# Patient Record
Sex: Female | Born: 1975 | Race: Black or African American | Hispanic: No | Marital: Single | Smoking: Current every day smoker
Health system: Southern US, Community
[De-identification: ages and names within clinical notes are randomized; demographics above are authoritative.]

## PROBLEM LIST (undated history)

## (undated) DIAGNOSIS — I10 Essential (primary) hypertension: Secondary | ICD-10-CM

## (undated) DIAGNOSIS — N739 Female pelvic inflammatory disease, unspecified: Secondary | ICD-10-CM

## (undated) DIAGNOSIS — R0602 Shortness of breath: Secondary | ICD-10-CM

## (undated) DIAGNOSIS — M549 Dorsalgia, unspecified: Secondary | ICD-10-CM

## (undated) DIAGNOSIS — Z789 Other specified health status: Secondary | ICD-10-CM

## (undated) DIAGNOSIS — K219 Gastro-esophageal reflux disease without esophagitis: Secondary | ICD-10-CM

## (undated) DIAGNOSIS — J45909 Unspecified asthma, uncomplicated: Secondary | ICD-10-CM

## (undated) DIAGNOSIS — M1990 Unspecified osteoarthritis, unspecified site: Secondary | ICD-10-CM

## (undated) DIAGNOSIS — L309 Dermatitis, unspecified: Secondary | ICD-10-CM

## (undated) DIAGNOSIS — R12 Heartburn: Secondary | ICD-10-CM

## (undated) DIAGNOSIS — N7093 Salpingitis and oophoritis, unspecified: Secondary | ICD-10-CM

## (undated) HISTORY — PX: TOTAL HIP ARTHROPLASTY: SHX124

## (undated) HISTORY — DX: Female pelvic inflammatory disease, unspecified: N73.9

## (undated) HISTORY — DX: Gastro-esophageal reflux disease without esophagitis: K21.9

## (undated) HISTORY — DX: Salpingitis and oophoritis, unspecified: N70.93

## (undated) HISTORY — DX: Dorsalgia, unspecified: M54.9

## (undated) HISTORY — DX: Heartburn: R12

## (undated) HISTORY — DX: Dermatitis, unspecified: L30.9

## (undated) HISTORY — DX: Essential (primary) hypertension: I10

## (undated) HISTORY — DX: Unspecified osteoarthritis, unspecified site: M19.90

## (undated) HISTORY — DX: Shortness of breath: R06.02

---

## 2012-10-01 ENCOUNTER — Emergency Department (HOSPITAL_COMMUNITY)
Admission: EM | Admit: 2012-10-01 | Discharge: 2012-10-01 | Disposition: A | Discharge status: Home / Self Care | Payer: Self-pay | Attending: Emergency Medicine | Admitting: Emergency Medicine

## 2012-10-01 ENCOUNTER — Encounter (HOSPITAL_COMMUNITY): Payer: Self-pay

## 2012-10-01 DIAGNOSIS — H6120 Impacted cerumen, unspecified ear: Secondary | ICD-10-CM | POA: Insufficient documentation

## 2012-10-01 DIAGNOSIS — F17200 Nicotine dependence, unspecified, uncomplicated: Secondary | ICD-10-CM | POA: Insufficient documentation

## 2012-10-01 DIAGNOSIS — H9190 Unspecified hearing loss, unspecified ear: Secondary | ICD-10-CM | POA: Insufficient documentation

## 2012-10-01 DIAGNOSIS — H6690 Otitis media, unspecified, unspecified ear: Secondary | ICD-10-CM | POA: Insufficient documentation

## 2012-10-01 MED ORDER — AMOXICILLIN 500 MG PO CAPS: 500.0000 mg | ORAL_CAPSULE | Freq: Three times a day (TID) | ORAL | Status: DC

## 2012-10-01 MED ORDER — DOCUSATE SODIUM 50 MG/5ML PO LIQD
10.0000 mg | Freq: Once | ORAL | Status: AC
  Administered 2012-10-01: 10 mg via ORAL
  Filled 2012-10-01: qty 10

## 2012-10-01 MED ORDER — ANTIPYRINE-BENZOCAINE 5.4-1.4 % OT SOLN: 3.0000 [drp] | OTIC | Status: DC | PRN

## 2012-10-01 NOTE — ED Provider Notes (Signed)
History     CSN: [id number]  Arrival date & time 10/01/12  2956   First MD Initiated Contact with Patient 10/01/12 0745      Chief Complaint  Patient presents with  . Otalgia    (Consider location/radiation/quality/duration/timing/severity/associated sxs/prior treatment) Patient is a 37 y.o. female presenting with ear pain.  Otalgia Location:  Bilateral Behind ear:  No abnormality Quality:  Aching and pressure Severity:  Mild Onset quality:  Gradual Duration:  4 days Timing:  Constant Progression:  Worsening Chronicity:  New Relieved by:  Nothing Ineffective treatments: ear wax removal. Associated symptoms: hearing loss   Associated symptoms: no abdominal pain, no congestion, no cough, no diarrhea, no ear discharge, no fever, no headaches, no rash, no rhinorrhea, no sore throat and no vomiting     Bre Ulloa is a 37 y.o. female  with no past medical history presents to the Emergency Department complaining of gradual, persistent, progressively worsening otalgia of the bilateral ears onset 4 days ago. Patient states she brought over-the-counter ear wax removal and attempted to remove the ear wax is at increased her pain. Patient also states she has associated decrease in hearing bilaterally.  Nothing makes it better and nothing makes it worse.  Pt denies fever, chills, headache, and [place], nasal congestion, sore throat, cough, congestion, nausea, vomiting, diarrhea, chest pain, shortness of breath.     History reviewed. No pertinent past medical history.  Past Surgical History  Procedure Laterality Date  . Cesarean section      No family history on file.  History  Substance Use Topics  . Smoking status: Current Every Day Smoker  . Smokeless tobacco: Not on file  . Alcohol Use: Yes    OB History   Grav Para Term Preterm Abortions TAB SAB Ect Mult Living                  Review of Systems  Constitutional: Negative for fever, chills, appetite change and  fatigue.  HENT: Positive for hearing loss and ear pain. Negative for congestion, sore throat, rhinorrhea, mouth sores, neck stiffness, postnasal drip, sinus pressure and ear discharge.   Eyes: Negative for visual disturbance.  Respiratory: Negative for cough, chest tightness, shortness of breath, wheezing and stridor.   Cardiovascular: Negative for chest pain, palpitations and leg swelling.  Gastrointestinal: Negative for nausea, vomiting, abdominal pain and diarrhea.  Genitourinary: Negative for dysuria, urgency, frequency and hematuria.  Musculoskeletal: Negative for myalgias, back pain and arthralgias.  Skin: Negative for rash.  Neurological: Negative for syncope, light-headedness, numbness and headaches.  Hematological: Negative for adenopathy.  Psychiatric/Behavioral: The patient is not nervous/anxious.   All other systems reviewed and are negative.    Allergies  Strawberry  Home Medications   Current Outpatient Rx  Name  Route  Sig  Dispense  Refill  . amoxicillin (AMOXIL) 500 MG capsule   Oral   Take 1 capsule (500 mg total) by mouth 3 (three) times daily.   21 capsule   0   . antipyrine-benzocaine (AURALGAN) otic solution   Right Ear   Place 3 drops into the right ear every 2 (two) hours as needed for pain.   10 mL   0     BP 114/87  Pulse 86  Temp(Src) 97.8 F (36.6 C) (Oral)  Resp 15  SpO2 100%  LMP 09/23/2012  Physical Exam  Constitutional: She is oriented to person, place, and time. She appears well-developed and well-nourished. No distress.  HENT:  Head: Normocephalic  and atraumatic.  Right Ear: External ear normal. Decreased hearing is noted.  Left Ear: External ear normal. Decreased hearing is noted.  Nose: No mucosal edema or rhinorrhea. No epistaxis. Right sinus exhibits no maxillary sinus tenderness and no frontal sinus tenderness. Left sinus exhibits no maxillary sinus tenderness and no frontal sinus tenderness.  Mouth/Throat: Uvula is midline,  oropharynx is clear and moist and mucous membranes are normal. Mucous membranes are not pale and not cyanotic. No edematous. No oropharyngeal exudate, posterior oropharyngeal edema, posterior oropharyngeal erythema or tonsillar abscesses.  Cerumen impaction in canals bilaterally  Eyes: Conjunctivae are normal. Pupils are equal, round, and reactive to light.  Neck: Normal range of motion and full passive range of motion without pain.  Cardiovascular: Normal rate, normal heart sounds and intact distal pulses.  Exam reveals no gallop and no friction rub.   No murmur heard. Pulmonary/Chest: Effort normal and breath sounds normal. No stridor.  Abdominal: Soft. Bowel sounds are normal.  Musculoskeletal: Normal range of motion.  Lymphadenopathy:    She has no cervical adenopathy.  Neurological: She is alert and oriented to person, place, and time. She exhibits normal muscle tone. Coordination normal.  Skin: Skin is dry. No rash noted. She is not diaphoretic.  Psychiatric: She has a normal mood and affect.    ED Course  Procedures (including critical care time)  Labs Reviewed - No data to display No results found.   1. Otitis media, bilateral       MDM  Sharene Skeans presents with otalgia and significant cerumen impaction. After several attempts at cerumen impaction removed completely from right ear and partially from left ear. Visualization of TMs shows erythema, bulging, blunted light reflex and purulent fluid behind the TM. Patient presents with otalgia and exam consistent with acute otitis media. No concern for acute mastoiditis, meningitis.  Hearing improved with cerumen removal. No antibiotic use in the last month.  Patient discharged home with Amoxicillin.  Advised patient to follow-up in 3-5 days for reevaluation.  I have also discussed reasons to return immediately to the ER.  Patient expresses understanding and agrees with plan.          Dierdre Forth, PA-C 10/01/12  [address], PA-C 10/01/12 1148

## 2012-10-01 NOTE — ED Notes (Signed)
Pt states she's had intermittent bilateral ear pain associated with "popping" for 4 days.  Pt states she used an ear wax removal kit from [facility] yesterday.  Pt states that it now feels like her ears are closed off.

## 2012-10-03 NOTE — ED Provider Notes (Signed)
Medical screening examination/treatment/procedure(s) were performed by non-physician practitioner and as supervising physician I was immediately available for consultation/collaboration.  Toy Baker, MD 10/03/12 [phone]

## 2013-01-06 ENCOUNTER — Inpatient Hospital Stay (HOSPITAL_COMMUNITY)
Admission: AD | Admit: 2013-01-06 | Discharge: 2013-01-07 | Disposition: A | Discharge status: Home / Self Care | Payer: Self-pay | Source: Ambulatory Visit | Attending: Obstetrics & Gynecology | Admitting: Obstetrics & Gynecology

## 2013-01-06 ENCOUNTER — Inpatient Hospital Stay (HOSPITAL_COMMUNITY): Payer: Self-pay

## 2013-01-06 ENCOUNTER — Encounter (HOSPITAL_COMMUNITY): Payer: Self-pay | Admitting: *Deleted

## 2013-01-06 DIAGNOSIS — O034 Incomplete spontaneous abortion without complication: Secondary | ICD-10-CM | POA: Insufficient documentation

## 2013-01-06 HISTORY — DX: Other specified health status: Z78.9

## 2013-01-06 LAB — URINALYSIS, ROUTINE W REFLEX MICROSCOPIC
Glucose, UA: NEGATIVE mg/dL
Specific Gravity, Urine: >1.03 — ABNORMAL HIGH (ref 1.005–1.030)

## 2013-01-06 LAB — POCT PREGNANCY, URINE: Preg Test, Ur: POSITIVE — AB

## 2013-01-06 LAB — URINE MICROSCOPIC-ADD ON

## 2013-01-06 LAB — CBC
HCT: 36.8 % (ref 36.0–46.0)
MCV: 88.9 fL (ref 78.0–100.0)
RBC: 4.14 MIL/uL (ref 3.87–5.11)
WBC: 9.1 10*3/uL (ref 4.0–10.5)

## 2013-01-06 LAB — WET PREP, GENITAL: Trich, Wet Prep: NONE SEEN

## 2013-01-06 NOTE — MAU Note (Signed)
[facility], spotting yesterday and today, vaginal bleeding x 1 hour. No pain

## 2013-01-06 NOTE — MAU Provider Note (Signed)
Chief Complaint: Vaginal Bleeding   First Provider Initiated Contact with Patient 01/06/13 2203     SUBJECTIVE HPI: Erica [name] is a 37 y.o. G4P3 at [redacted]w[redacted]d by LMP who presents to maternity admissions reporting bright red vaginal bleeding starting 2-3 hours prior to arrival in MAU.  She reports that brown vaginal spotting started 2 days ago, then tonight she had bright red bleeding when she wiped, but not enough to soak a pad.  She denies abdominal pain, vaginal itching/burning, urinary symptoms, h/a, dizziness, n/v, or fever/chills.     Past Medical History  Diagnosis Date  . Medical history non-contributory    Past Surgical History  Procedure Laterality Date  . Cesarean section     History   Social History  . Marital Status: Single    Spouse Name: N/A    Number of Children: N/A  . Years of Education: N/A   Occupational History  . Not on file.   Social History Main Topics  . Smoking status: Current Every Day Smoker  . Smokeless tobacco: Not on file  . Alcohol Use: No  . Drug Use: No  . Sexually Active: Yes    Birth Control/ Protection: None   Other Topics Concern  . Not on file   Social History Narrative  . No narrative on file   No current facility-administered medications on file prior to encounter.   No current outpatient prescriptions on file prior to encounter.   Allergies  Allergen Reactions  . Strawberry Swelling    ROS: Pertinent items in HPI  OBJECTIVE Blood pressure 130/81, pulse 82, temperature 99.1 F (37.3 C), temperature source Oral, resp. rate 20, height 5\' 1"  (1.549 m), weight 85.73 kg (189 lb), last menstrual period 11/27/2012, SpO2 98.00%. GENERAL: Well-developed, well-nourished female in no acute distress.  HEENT: Normocephalic HEART: normal rate RESP: normal effort ABDOMEN: Soft, non-tender EXTREMITIES: Nontender, no edema NEURO: Alert and oriented Pelvic exam: Cervix pink, visually closed, without lesion, small amount dark red blood  in vaginal vault and from cervical os, vaginal walls and external genitalia normal Bimanual exam: Cervix 0/long/high, firm, anterior, neg CMT, uterus nontender, nonenlarged, adnexa without tenderness, enlargement, or mass  LAB RESULTS Results for orders placed during the hospital encounter of 01/06/13 (from the past 24 hour(s))  URINALYSIS, ROUTINE W REFLEX MICROSCOPIC     Status: Abnormal   Collection Time    01/06/13  9:03 PM      Result Value Range   Color, Urine YELLOW  YELLOW   APPearance CLEAR  CLEAR   Specific Gravity, Urine >1.030 (*) 1.005 - 1.030   pH 6.0  5.0 - 8.0   Glucose, UA NEGATIVE  NEGATIVE mg/dL   Hgb urine dipstick LARGE (*) NEGATIVE   Bilirubin Urine NEGATIVE  NEGATIVE   Ketones, ur NEGATIVE  NEGATIVE mg/dL   Protein, ur NEGATIVE  NEGATIVE mg/dL   Urobilinogen, UA 0.2  0.0 - 1.0 mg/dL   Nitrite NEGATIVE  NEGATIVE   Leukocytes, UA SMALL (*) NEGATIVE  URINE MICROSCOPIC-ADD ON     Status: Abnormal   Collection Time    01/06/13  9:03 PM      Result Value Range   Squamous Epithelial / LPF FEW (*) RARE   WBC, UA 7-10  <3 WBC/hpf   RBC / HPF 11-20  <3 RBC/hpf   Bacteria, UA FEW (*) RARE  POCT PREGNANCY, URINE     Status: Abnormal   Collection Time    01/06/13  9:11 PM  Result Value Range   Preg Test, Ur POSITIVE (*) NEGATIVE  CBC     Status: None   Collection Time    01/06/13  9:50 PM      Result Value Range   WBC 9.1  4.0 - 10.5 K/uL   RBC 4.14  3.87 - 5.11 MIL/uL   Hemoglobin 12.3  12.0 - 15.0 g/dL   HCT 40.9  81.1 - 91.4 %   MCV 88.9  78.0 - 100.0 fL   MCH 29.7  26.0 - 34.0 pg   MCHC 33.4  30.0 - 36.0 g/dL   RDW 78.2  95.6 - 21.3 %   Platelets 258  150 - 400 K/uL  ABO/RH     Status: None   Collection Time    01/06/13  9:50 PM      Result Value Range   ABO/RH(D) O POS    HCG, QUANTITATIVE, PREGNANCY     Status: Abnormal   Collection Time    01/06/13  9:50 PM      Result Value Range   hCG, Beta Chain, Quant, S 17875 (*) <5 mIU/mL  WET PREP,  GENITAL     Status: Abnormal   Collection Time    01/06/13 10:00 PM      Result Value Range   Yeast Wet Prep HPF POC NONE SEEN  NONE SEEN   Trich, Wet Prep NONE SEEN  NONE SEEN   Clue Cells Wet Prep HPF POC FEW (*) NONE SEEN   WBC, Wet Prep HPF POC NONE SEEN  NONE SEEN    IMAGING US Ob Comp Less 14 Wks  01/06/2013   *RADIOLOGY REPORT*  Clinical Data: Bleeding, pregnancy, quantitative beta HCG 17,175; gestational age [redacted] weeks 5 days EGA by LMP  OBSTETRIC <14 WK [place] AND TRANSVAGINAL OB US  Technique:  Both transabdominal and transvaginal ultrasound examinations were performed for complete evaluation of the gestation as well as the maternal uterus, adnexal regions, and pelvic cul-de-sac.  Transvaginal technique was performed to assess early pregnancy.  Comparison:  None  Intrauterine gestational sac:  Visualized/normal in shape. Yolk sac: Absent Embryo: Absent Cardiac Activity: N/A Heart Rate: N/A bpm  MSD: 15.1 mm        6 w 3 d         [place] EDC:  Maternal uterus/adnexae: No subchorionic hemorrhage. Left ovary normal size and morphology 3.4 x 2.3 x 1.4 cm. Right ovary measures 4.6 x 3.2 by 4.7 cm and contains a 2.8 x 2.7 x 2.6 cm corpus luteal cyst. No other adnexal masses or free pelvic fluid.  IMPRESSION: Small gestational sac identified within uterus without visualization of a yolk sac or fetal pole. The absence of a yolk sac and fetal pole at this sac diameter and this quantitative beta HCG level is suspicious for a nonviable pregnancy. Follow-up ultrasound recommended in 10-14 days to reassess for viability.   Original Report Authenticated By: Ulyses Southward, M.D.   US Ob Transvaginal  01/06/2013   *RADIOLOGY REPORT*  Clinical Data: Bleeding, pregnancy, quantitative beta HCG 17,175; gestational age [redacted] weeks 5 days EGA by LMP  OBSTETRIC <14 WK [place] AND TRANSVAGINAL OB US  Technique:  Both transabdominal and transvaginal ultrasound examinations were performed for complete evaluation of the gestation as well as  the maternal uterus, adnexal regions, and pelvic cul-de-sac.  Transvaginal technique was performed to assess early pregnancy.  Comparison:  None  Intrauterine gestational sac:  Visualized/normal in shape. Yolk sac: Absent Embryo: Absent Cardiac Activity:  N/A Heart Rate: N/A bpm  MSD: 15.1 mm        6 w 3 d         [place] EDC:  Maternal uterus/adnexae: No subchorionic hemorrhage. Left ovary normal size and morphology 3.4 x 2.3 x 1.4 cm. Right ovary measures 4.6 x 3.2 by 4.7 cm and contains a 2.8 x 2.7 x 2.6 cm corpus luteal cyst. No other adnexal masses or free pelvic fluid.  IMPRESSION: Small gestational sac identified within uterus without visualization of a yolk sac or fetal pole. The absence of a yolk sac and fetal pole at this sac diameter and this quantitative beta HCG level is suspicious for a nonviable pregnancy. Follow-up ultrasound recommended in 10-14 days to reassess for viability.   Original Report Authenticated By: Ulyses Southward, M.D.    ASSESSMENT 1. Incomplete miscarriage     PLAN Discharge home with bleeding precautions Outpatient U/S and quant hcg in clinic in 1 week Return to MAU as needed   Sharen Counter Certified Nurse-Midwife 01/06/2013  10:51 PM

## 2013-01-07 DIAGNOSIS — O034 Incomplete spontaneous abortion without complication: Secondary | ICD-10-CM

## 2013-01-08 LAB — GC/CHLAMYDIA PROBE AMP
CT Probe RNA: NEGATIVE
GC Probe RNA: NEGATIVE

## 2013-01-08 NOTE — MAU Provider Note (Signed)
Attestation of Attending Supervision of Advanced Practitioner (CNM/NP): Evaluation and management procedures were performed by the Advanced Practitioner under my supervision and collaboration. I have reviewed the Advanced Practitioner's note and chart, and I agree with the management and plan.  Krystl Wickware H. 11:26 AM   

## 2013-01-13 ENCOUNTER — Inpatient Hospital Stay (HOSPITAL_COMMUNITY)
Admission: AD | Admit: 2013-01-13 | Discharge: 2013-01-13 | Disposition: A | Discharge status: Home / Self Care | Payer: Self-pay | Source: Ambulatory Visit | Attending: Obstetrics & Gynecology | Admitting: Obstetrics & Gynecology

## 2013-01-13 ENCOUNTER — Other Ambulatory Visit (HOSPITAL_COMMUNITY): Payer: Self-pay | Admitting: Advanced Practice Midwife

## 2013-01-13 ENCOUNTER — Other Ambulatory Visit: Payer: Self-pay | Admitting: Advanced Practice Midwife

## 2013-01-13 ENCOUNTER — Ambulatory Visit (HOSPITAL_COMMUNITY)
Admission: RE | Admit: 2013-01-13 | Discharge: 2013-01-13 | Disposition: A | Discharge status: Home / Self Care | Payer: Self-pay | Source: Ambulatory Visit | Attending: Advanced Practice Midwife | Admitting: Advanced Practice Midwife

## 2013-01-13 DIAGNOSIS — O209 Hemorrhage in early pregnancy, unspecified: Secondary | ICD-10-CM | POA: Insufficient documentation

## 2013-01-13 DIAGNOSIS — O034 Incomplete spontaneous abortion without complication: Secondary | ICD-10-CM

## 2013-01-13 DIAGNOSIS — O021 Missed abortion: Secondary | ICD-10-CM

## 2013-01-13 DIAGNOSIS — Z3689 Encounter for other specified antenatal screening: Secondary | ICD-10-CM | POA: Insufficient documentation

## 2013-01-13 LAB — CBC
Hemoglobin: 12.2 g/dL (ref 12.0–15.0)
MCH: 30 pg (ref 26.0–34.0)
MCHC: 33.4 g/dL (ref 30.0–36.0)
MCV: 89.9 fL (ref 78.0–100.0)
Platelets: 250 10*3/uL (ref 150–400)

## 2013-01-13 MED ORDER — MISOPROSTOL 200 MCG PO TABS: ORAL_TABLET | ORAL | Status: DC

## 2013-01-13 MED ORDER — PROMETHAZINE HCL 25 MG PO TABS: 25.0000 mg | ORAL_TABLET | Freq: Four times a day (QID) | ORAL | Status: DC | PRN

## 2013-01-13 MED ORDER — IBUPROFEN 800 MG PO TABS: 800.0000 mg | ORAL_TABLET | Freq: Three times a day (TID) | ORAL | Status: DC | PRN

## 2013-01-13 MED ORDER — HYDROCODONE-ACETAMINOPHEN 5-325 MG PO TABS: 1.0000 | ORAL_TABLET | Freq: Four times a day (QID) | ORAL | Status: DC | PRN

## 2013-01-13 NOTE — MAU Note (Signed)
Denies pain, no further bleeding. No complaints offered.

## 2013-01-13 NOTE — Progress Notes (Signed)
Pt called after leaving MAU earlier to state that she had reconsidered and would now like cytotec for missed AB. Rx sent for Cytotec 800 mcg vaginally x 1, phenergan and vicodin as well. Precautions rev'd. Keep follow up appointment in clinic in 2 weeks as planned.   Results for orders placed during the hospital encounter of 01/13/13 (from the past 24 hour(s))  CBC     Status: None   Collection Time    01/13/13 11:15 AM      Result Value Range   WBC 9.6  4.0 - 10.5 K/uL   RBC 4.06  3.87 - 5.11 MIL/uL   Hemoglobin 12.2  12.0 - 15.0 g/dL   HCT 09.8  11.9 - 14.7 %   MCV 89.9  78.0 - 100.0 fL   MCH 30.0  26.0 - 34.0 pg   MCHC 33.4  30.0 - 36.0 g/dL   RDW 82.9  56.2 - 13.0 %   Platelets 250  150 - 400 K/uL     FRAZIER,NATALIE 01/13/2013 2:09 PM

## 2013-01-13 NOTE — MAU Provider Note (Signed)
Attestation of Attending Supervision of Advanced Practitioner (PA/CNM/NP): Evaluation and management procedures were performed by the Advanced Practitioner under my supervision and collaboration.  I have reviewed the Advanced Practitioner's note and chart, and I agree with the management and plan.  Naja Apperson, MD, FACOG Attending Obstetrician & Gynecologist Faculty Practice, [facility]  

## 2013-01-13 NOTE — MAU Provider Note (Signed)
  History     CSN: [id number]  Arrival date and time: 01/13/13 1057   First Provider Initiated Contact with Patient 01/13/13 1123      Chief Complaint  Patient presents with  . Follow-up   HPI 37 y.o. G4P3 at [redacted]w[redacted]d by LMP here for f/u u/s for viablity. Seen last week in MAU, quant 17000, u/s showed 6+ week size IUGS with no yolk sac or fetal pole, suspicious for failed IUP. Pt reports no pain or bleeding at this time, had some brown discharge a few days ago.   Past Medical History  Diagnosis Date  . Medical history non-contributory     Past Surgical History  Procedure Laterality Date  . Cesarean section      No family history on file.  History  Substance Use Topics  . Smoking status: Current Every Day Smoker  . Smokeless tobacco: Not on file  . Alcohol Use: No    Allergies:  Allergies  Allergen Reactions  . Strawberry Swelling    No prescriptions prior to admission    Review of Systems  Constitutional: Negative.   Respiratory: Negative.   Cardiovascular: Negative.   Gastrointestinal: Negative for nausea, vomiting, abdominal pain, diarrhea and constipation.  Genitourinary: Negative for dysuria, urgency, frequency, hematuria and flank pain.       Negative for vaginal bleeding, vaginal discharge  Musculoskeletal: Negative.   Neurological: Negative.   Psychiatric/Behavioral: Negative.    Physical Exam   Blood pressure 127/80, pulse 75, temperature 97.8 F (36.6 C), temperature source Oral, resp. rate 18, last menstrual period 11/27/2012.  Physical Exam  Nursing note and vitals reviewed. Constitutional: She is oriented to person, place, and time. She appears well-developed and well-nourished. No distress.  Cardiovascular: Normal rate.   Respiratory: Effort normal.  GI: Soft. There is no tenderness.  Musculoskeletal: Normal range of motion.  Neurological: She is alert and oriented to person, place, and time.  Skin: Skin is warm and dry.  Psychiatric: She  has a normal mood and affect.    MAU Course  Procedures   Assessment and Plan   1. Missed abortion   Recommended cytotec for missed AB - pt declines at this time. Plan for f/u in clinic in 2 weeks, rev'd precautions.     Medication List         ibuprofen 800 MG tablet  Commonly known as:  ADVIL,MOTRIN  Take 1 tablet (800 mg total) by mouth every 8 (eight) hours as needed for pain.            Follow-up Information   Follow up with [facility] (someone will call to schedule appointment)    Contact information:   [address] [place] [address] [phone]        [facility] 01/13/2013, 11:32 AM   See note from orders only encounter - pt called back and requested cytotec rx after reconsidering at home. Rx sent, rev'd precautions, keep plan for f/u in 2 weeks in GYN clinic.   Jayleon Mcfarlane 01/13/13, 3:13 PM

## 2013-01-17 ENCOUNTER — Encounter: Payer: Self-pay | Admitting: Obstetrics & Gynecology

## 2013-01-25 ENCOUNTER — Inpatient Hospital Stay (HOSPITAL_COMMUNITY)
Admission: AD | Admit: 2013-01-25 | Discharge: 2013-01-26 | Disposition: A | Discharge status: Home / Self Care | Payer: Self-pay | Source: Ambulatory Visit | Attending: Obstetrics & Gynecology | Admitting: Obstetrics & Gynecology

## 2013-01-25 ENCOUNTER — Inpatient Hospital Stay (HOSPITAL_COMMUNITY): Payer: Self-pay

## 2013-01-25 DIAGNOSIS — R109 Unspecified abdominal pain: Secondary | ICD-10-CM | POA: Insufficient documentation

## 2013-01-25 DIAGNOSIS — O034 Incomplete spontaneous abortion without complication: Secondary | ICD-10-CM | POA: Insufficient documentation

## 2013-01-25 LAB — CBC
HCT: 30.4 % — ABNORMAL LOW (ref 36.0–46.0)
MCV: 88.6 fL (ref 78.0–100.0)
Platelets: 236 10*3/uL (ref 150–400)
RBC: 3.43 MIL/uL — ABNORMAL LOW (ref 3.87–5.11)
WBC: 11.1 10*3/uL — ABNORMAL HIGH (ref 4.0–10.5)

## 2013-01-25 MED ORDER — OXYCODONE-ACETAMINOPHEN 5-325 MG PO TABS
2.0000 | ORAL_TABLET | Freq: Once | ORAL | Status: AC
  Administered 2013-01-25: 2 via ORAL
  Filled 2013-01-25: qty 2

## 2013-01-25 NOTE — MAU Note (Signed)
Pt presents with severe abdominal pain after miscarriage last week with continued heavy bleeding and cramping. Pain is at a 10.  Bleeding still noted to pad.  Pt was on ibuprofen but this was not helping.

## 2013-01-25 NOTE — MAU Note (Signed)
Pt 8.3 wks, used cytotec vaginally on 7/15, continues to bleed, passing clots and have severe abd pain.

## 2013-01-26 ENCOUNTER — Encounter: Payer: Self-pay | Admitting: Obstetrics & Gynecology

## 2013-01-26 DIAGNOSIS — O034 Incomplete spontaneous abortion without complication: Secondary | ICD-10-CM

## 2013-01-26 MED ORDER — OXYCODONE-ACETAMINOPHEN 5-325 MG PO TABS: 1.0000 | ORAL_TABLET | ORAL | Status: DC | PRN

## 2013-01-26 MED ORDER — MISOPROSTOL 200 MCG PO TABS
800.0000 ug | ORAL_TABLET | Freq: Once | ORAL | Status: AC
  Administered 2013-01-26: 800 ug via VAGINAL
  Filled 2013-01-26: qty 4

## 2013-01-26 MED ORDER — HYDROMORPHONE HCL PF 1 MG/ML IJ SOLN
1.0000 mg | INTRAMUSCULAR | Status: DC | PRN
  Administered 2013-01-26: 1 mg via INTRAVENOUS
  Filled 2013-01-26: qty 1

## 2013-01-26 MED ORDER — LACTATED RINGERS IV BOLUS (SEPSIS)
1000.0000 mL | Freq: Once | INTRAVENOUS | Status: AC
  Administered 2013-01-26: 1000 mL via INTRAVENOUS

## 2013-01-26 NOTE — MAU Provider Note (Signed)
Chief Complaint: Vaginal Bleeding, Abdominal Pain and Miscarriage   First Provider Initiated Contact with Patient 01/26/13 0055     SUBJECTIVE HPI: Erica [name] is a 37 y.o. [id number] Dx w/ 8 week missed AB 01/13/13 who presents with severe low abd cramping and continued moderate to heavy bleeding and passing clots since taking Cytotec 01/17/13. No relief of cramping w/ Ibuprofen.   Past Medical History  Diagnosis Date  . Medical history non-contributory    OB History   Grav Para Term Preterm Abortions TAB SAB Ect Mult Living   4 3        3      # Outc Date GA Lbr Len/2nd Wgt Sex Del Anes PTL Lv   1 PAR            2 PAR            3 PAR            4 CUR              Past Surgical History  Procedure Laterality Date  . Cesarean section     History   Social History  . Marital Status: Single    Spouse Name: N/A    Number of Children: N/A  . Years of Education: N/A   Occupational History  . Not on file.   Social History Main Topics  . Smoking status: Current Every Day Smoker  . Smokeless tobacco: Not on file  . Alcohol Use: No  . Drug Use: No  . Sexually Active: Yes    Birth Control/ Protection: None   Other Topics Concern  . Not on file   Social History Narrative  . No narrative on file   No current facility-administered medications on file prior to encounter.   Current Outpatient Prescriptions on File Prior to Encounter  Medication Sig Dispense Refill  . ibuprofen (ADVIL,MOTRIN) 800 MG tablet Take 1 tablet (800 mg total) by mouth every 8 (eight) hours as needed for pain.  30 tablet  0   Allergies  Allergen Reactions  . Strawberry Swelling    ROS: Pos for ? Fever 4 days ago. None since. Pos for low cramping, VB and passing clots. Neg for chills, urinary complaints, dizziness, GI complaints. Unsure if she has passed tissue.   OBJECTIVE Blood pressure 124/68, pulse 89, temperature 98.2 F (36.8 C), temperature source Oral, resp. rate 18, height 5' (1.524 m),  weight 86.728 kg (191 lb 3.2 oz), last menstrual period 11/27/2012. GENERAL: Well-developed, well-nourished female in moderate distress.  HEENT: Normocephalic HEART: normal rate RESP: normal effort ABDOMEN: Soft, non-tender EXTREMITIES: Nontender, no edema NEURO: Alert and oriented SPECULUM EXAM: NEFG, moderate amount of bright red blood with large clots in vaginal vault. Attempted to remove clot/?GS from os. Unable to, cervix clean. Small amount of active bleeding. BIMANUAL: cervix FT; uterus normal size, no adnexal tenderness or masses. No CMT.   LAB RESULTS Results for orders placed during the hospital encounter of 01/25/13 (from the past 24 hour(s))  HCG, QUANTITATIVE, PREGNANCY     Status: Abnormal   Collection Time    01/25/13 11:05 PM      Result Value Range   hCG, Beta Chain, Quant, S 3505 (*) <5 mIU/mL  CBC     Status: Abnormal   Collection Time    01/25/13 11:05 PM      Result Value Range   WBC 11.1 (*) 4.0 - 10.5 K/uL   RBC 3.43 (*) 3.87 - 5.11  MIL/uL   Hemoglobin 10.1 (*) 12.0 - 15.0 g/dL   HCT 19.1 (*) 47.8 - 29.5 %   MCV 88.6  78.0 - 100.0 fL   MCH 29.4  26.0 - 34.0 pg   MCHC 33.2  30.0 - 36.0 g/dL   RDW 62.1  30.8 - 65.7 %   Platelets 236  150 - 400 K/uL    IMAGING US Ob Transvaginal  01/26/2013   *RADIOLOGY REPORT*  Clinical Data: Recent missed abortion.  Persistent bleeding. Question retained products of conception.  TRANSVAGINAL OB ULTRASOUND  Technique:  Transvaginal ultrasound was performed for evaluation of the gestation as well as the maternal uterus and adnexal regions.  Comparison: Transvaginal obstetric ultrasound 01/13/2013.  Findings: No gestational sac is identified.  The endometrial cavity is expanded within the lower uterine segment with heterogeneous echogenicity suggesting retained products of conception.  The adnexa are within normal limits bilaterally.  No significant free fluid is present.  IMPRESSION:  1.  Heterogeneous material in the  endometrial cavity within the lower uterine segment is compatible with retained products of conception. 2.  No gestational sac is present.   Original Report Authenticated By: Marin Roberts, M.D.   MAU COURSE Pain 5/10 after percocet. Small amount of active bleeding. Discussed Pain, bleeding, [place] CBC with Dr. Despina Hidden. Recommends repeating cytotec in MAU and observing pts bleeding for a few hours before D/C. Pt agrees. Cytotec given.   0510: Pain minimal w/ Dilaudid 1 mg. 1 liter IV fluids infused. Small amount of bleeding. No passage of tissue.  ASSESSMENT 1. Incomplete miscarriage     PLAN Discharge home in stable condition. Bleeding and infection precautions.     Follow-up Information   Follow up with [facility] In 1 week.   Contact information:   [address] [place] [address] [phone]      Follow up with [facility] [place] [facility]. (As needed if symptoms worsen or no improvement in 48 hours)    Contact information:   [address] [id number] [place] [address] [phone]       Medication List    STOP taking these medications       misoprostol 200 MCG tablet  Commonly known as:  CYTOTEC      TAKE these medications       ibuprofen 800 MG tablet  Commonly known as:  ADVIL,MOTRIN  Take 1 tablet (800 mg total) by mouth every 8 (eight) hours as needed for pain.     oxyCODONE-acetaminophen 5-325 MG per tablet  Commonly known as:  PERCOCET/ROXICET  Take 1 tablet by mouth every 4 (four) hours as needed for pain.     promethazine 25 MG tablet  Commonly known as:  PHENERGAN  Take 1 tablet (25 mg total) by mouth every 6 (six) hours as needed for nausea.       [place], CNM 01/26/2013  5:25 AM

## 2013-02-02 ENCOUNTER — Encounter: Payer: Self-pay | Admitting: Obstetrics & Gynecology

## 2013-02-02 ENCOUNTER — Encounter: Payer: Self-pay | Admitting: Obstetrics and Gynecology

## 2013-02-02 ENCOUNTER — Ambulatory Visit (INDEPENDENT_AMBULATORY_CARE_PROVIDER_SITE_OTHER): Payer: Self-pay | Admitting: Obstetrics & Gynecology

## 2013-02-02 VITALS — BP 131/87 | HR 137 | Temp 97.1°F | Ht 61.0 in | Wt 183.6 lb

## 2013-02-02 DIAGNOSIS — O034 Incomplete spontaneous abortion without complication: Secondary | ICD-10-CM

## 2013-02-02 NOTE — Progress Notes (Signed)
  Subjective:    Patient ID: Erica [name], female    DOB: 1975-08-03, 37 y.o.   MRN: [id number]  HPI Pt here for follow up after missed incomplete Ab that she was seen in the MAU for on 7/4, 7/11, and 7/23.   She states that since her last visit her bleeding has slowed but continued, using approx 3-4 pads a day now, and that her pain has improved until 1 day ago. She had a return of severe cramping yesterday which has continued into today. She notes low grade fever of 100.2 at home. She states that she was complaint with the first dose of cytotec on 7/11.    Review of Systems Per HPI    Objective:   Physical Exam  Gen: NAD, alert, cooperative with exam HEENT: NCAT Neuro: Alert and oriented, No gross deficits GU: gross blood at the introitus, speculum exam extremely painful to the patient revealing cervix with black stringy tissue coming from the cervix.      Assessment & Plan:   Incomplete AB - Pt with continued pain and bleeding s/p cytotec X 2  - Likely products of conception identified on speculum exam  - Continue percocet PO for pain  - Will proceed with D&C tomorrow  Murtis Sink, MD [facility] Family Medicine Resident, PGY-2 02/02/2013, 3:27 PM   Agree with note, care plan was reviewed with the patient. Procedure and risk of pelvic damage, infection, pain, bleeding , anesthesia discussed and questions were answered.  Adam Phenix, MD 02/02/2013

## 2013-02-02 NOTE — Patient Instructions (Signed)
You will be called for your time to come to the hospital tomorrow  Dilation and Curettage or Vacuum Curettage Dilation and curettage (D&C) and vacuum curettage are minor procedures. A D&C involves stretching (dilation) the cervix and scraping (curettage) the inside lining of the womb (uterus). During a D&C, tissue is gently scraped from the inside lining of the uterus. During a vacuum curettage, the lining and tissue in the uterus are removed with the use of gentle suction. Curettage may be performed for diagnostic or therapeutic purposes. As a diagnostic procedure, curettage is performed for the purpose of examining tissues from the uterus. Tissue examination may help determine causes or treatment options for symptoms. A diagnostic curettage may be performed for the following symptoms:  Irregular bleeding in the uterus.  Bleeding with the development of clots.  Spotting between menstrual periods.  Prolonged menstrual periods.  Bleeding after menopause.  No menstrual period (amenorrhea).  A change in size and shape of the uterus. A therapeutic curettage is performed to remove tissue, blood, or a contraceptive device. Therapeutic curettage may be performed for the following conditions:   Removal of an IUD (intrauterine device).  Removal of retained placenta after giving birth. Retained placenta can cause bleeding severe enough to require transfusions or an infection.  Abortion.  Miscarriage.  Removal of polyps inside the uterus.  Removal of uncommon types of fibroids (noncancerous lumps). LET YOUR CAREGIVER KNOW ABOUT:   Allergies to food or medicine.  Medicines taken, including vitamins, herbs, eyedrops, over-the-counter medicines, and creams.  Use of steroids (by mouth or creams).  Previous problems with anesthetics or numbing medicines.  History of bleeding problems or blood clots.  Previous surgery.  Other health problems, including diabetes and kidney  problems.  Possibility of pregnancy, if this applies. RISKS AND COMPLICATIONS   Excessive bleeding.  Infection of the uterus.  Damage to the cervix.  Development of scar tissue (adhesions) inside the uterus, later causing abnormal amounts of menstrual bleeding.  Complications from the general anesthetic, if a general anesthetic is used.  Putting a hole (perforation) in the uterus. This is rare. BEFORE THE PROCEDURE   Eat and drink before the procedure only as directed by your caregiver.  Arrange for someone to take you home. PROCEDURE   This procedure may be done in a hospital, outpatient clinic, or caregiver's office.  You may be given a general anesthetic or a local anesthetic in and around the cervix.  You will lie on your back with your legs in stirrups.  There are two ways in which your cervix can be softened and dilated. These include:  Taking a medicine.  Having thin rods (laminaria) inserted into your cervix.  A curved tool (curette) will scrape cells from the inside lining of the uterus and will then be removed. This procedure usually takes about 15 to 30 minutes. AFTER THE PROCEDURE   You will rest in the recovery area until you are stable and are ready to go home.  You will need to have someone take you home.  You may feel sick to your stomach (nauseous) or throw up (vomit) if you had general anesthesia.  You may have a sore throat if a tube was placed in your throat during general anesthesia.  You may have light cramping and bleeding for 2 days to 2 weeks after the procedure.  Your uterus needs to make a new lining after the procedure. This may make your next period late. Document Released: 06/22/2005 Document Revised: 09/14/2011 Document  Reviewed: 01/18/2009 ExitCare Patient Information 2014 [place], [place].

## 2013-02-03 ENCOUNTER — Observation Stay (HOSPITAL_COMMUNITY): Payer: Medicaid Other

## 2013-02-03 ENCOUNTER — Ambulatory Visit (HOSPITAL_COMMUNITY): Payer: Medicaid Other | Admitting: Anesthesiology

## 2013-02-03 ENCOUNTER — Encounter (HOSPITAL_COMMUNITY): Payer: Self-pay

## 2013-02-03 ENCOUNTER — Inpatient Hospital Stay (HOSPITAL_COMMUNITY)
Admission: RE | Admit: 2013-02-03 | Discharge: 2013-02-13 | DRG: 770 | Disposition: A | Discharge status: Home / Self Care | Payer: Medicaid Other | Source: Ambulatory Visit | Attending: Obstetrics & Gynecology | Admitting: Obstetrics & Gynecology

## 2013-02-03 ENCOUNTER — Encounter (HOSPITAL_COMMUNITY): Payer: Self-pay | Admitting: Anesthesiology

## 2013-02-03 ENCOUNTER — Encounter (HOSPITAL_COMMUNITY): Payer: Self-pay | Admitting: Pharmacy Technician

## 2013-02-03 ENCOUNTER — Encounter (HOSPITAL_COMMUNITY)
Admission: RE | Disposition: A | Discharge status: Home / Self Care | Payer: Self-pay | Source: Ambulatory Visit | Attending: Obstetrics & Gynecology

## 2013-02-03 DIAGNOSIS — N731 Chronic parametritis and pelvic cellulitis: Secondary | ICD-10-CM

## 2013-02-03 DIAGNOSIS — O030 Genital tract and pelvic infection following incomplete spontaneous abortion: Principal | ICD-10-CM | POA: Diagnosis present

## 2013-02-03 DIAGNOSIS — N7093 Salpingitis and oophoritis, unspecified: Secondary | ICD-10-CM | POA: Diagnosis present

## 2013-02-03 DIAGNOSIS — N390 Urinary tract infection, site not specified: Secondary | ICD-10-CM | POA: Diagnosis present

## 2013-02-03 DIAGNOSIS — O035 Genital tract and pelvic infection following complete or unspecified spontaneous abortion: Secondary | ICD-10-CM | POA: Diagnosis present

## 2013-02-03 DIAGNOSIS — A5903 Trichomonal cystitis and urethritis: Secondary | ICD-10-CM | POA: Diagnosis present

## 2013-02-03 DIAGNOSIS — O034 Incomplete spontaneous abortion without complication: Secondary | ICD-10-CM

## 2013-02-03 DIAGNOSIS — N739 Female pelvic inflammatory disease, unspecified: Secondary | ICD-10-CM | POA: Diagnosis present

## 2013-02-03 HISTORY — PX: DILATION AND EVACUATION: SHX1459

## 2013-02-03 LAB — COMPREHENSIVE METABOLIC PANEL
AST: 18 U/L (ref 0–37)
CO2: 21 mEq/L (ref 19–32)
Calcium: 9 mg/dL (ref 8.4–10.5)
Creatinine, Ser: 0.91 mg/dL (ref 0.50–1.10)
GFR calc Af Amer: >90 mL/min (ref >90)
GFR calc non Af Amer: 81 mL/min — ABNORMAL LOW (ref >90)
Glucose, Bld: 112 mg/dL — ABNORMAL HIGH (ref 70–99)

## 2013-02-03 LAB — CBC
MCV: 87.2 fL (ref 78.0–100.0)
Platelets: 397 10*3/uL (ref 150–400)
RDW: 14.7 % (ref 11.5–15.5)
WBC: 37.4 10*3/uL — ABNORMAL HIGH (ref 4.0–10.5)

## 2013-02-03 LAB — URINALYSIS, ROUTINE W REFLEX MICROSCOPIC
Glucose, UA: 100 mg/dL — AB
Protein, ur: >300 mg/dL — AB

## 2013-02-03 LAB — URINE MICROSCOPIC-ADD ON

## 2013-02-03 SURGERY — DILATION AND EVACUATION, UTERUS
Anesthesia: General | Site: Uterus | Wound class: Clean Contaminated

## 2013-02-03 MED ORDER — BUPIVACAINE-EPINEPHRINE 0.5% -1:200000 IJ SOLN
INTRAMUSCULAR | Status: DC | PRN
  Administered 2013-02-03: 7 mL

## 2013-02-03 MED ORDER — ONDANSETRON HCL 4 MG/2ML IJ SOLN
INTRAMUSCULAR | Status: AC
  Filled 2013-02-03: qty 2

## 2013-02-03 MED ORDER — FENTANYL CITRATE 0.05 MG/ML IJ SOLN
INTRAMUSCULAR | Status: DC | PRN
  Administered 2013-02-03: 50 ug via INTRAVENOUS
  Administered 2013-02-03: 25 ug via INTRAVENOUS
  Administered 2013-02-03: 50 ug via INTRAVENOUS
  Administered 2013-02-03: 25 ug via INTRAVENOUS

## 2013-02-03 MED ORDER — DEXTROSE 5 % IV SOLN
2.0000 g | INTRAVENOUS | Status: AC
  Administered 2013-02-03: 2 g via INTRAVENOUS
  Filled 2013-02-03: qty 2

## 2013-02-03 MED ORDER — LACTATED RINGERS IV SOLN
INTRAVENOUS | Status: DC
  Administered 2013-02-03 – 2013-02-13 (×14): via INTRAVENOUS

## 2013-02-03 MED ORDER — METRONIDAZOLE 500 MG PO TABS
2000.0000 mg | ORAL_TABLET | Freq: Once | ORAL | Status: AC
  Administered 2013-02-03: 2000 mg via ORAL
  Filled 2013-02-03: qty 4

## 2013-02-03 MED ORDER — OXYCODONE-ACETAMINOPHEN 5-325 MG PO TABS
1.0000 | ORAL_TABLET | ORAL | Status: DC | PRN
  Administered 2013-02-03 – 2013-02-05 (×7): 2 via ORAL
  Administered 2013-02-05: 1 via ORAL
  Administered 2013-02-05 – 2013-02-07 (×8): 2 via ORAL
  Filled 2013-02-03 (×2): qty 2
  Filled 2013-02-03: qty 1
  Filled 2013-02-03 (×6): qty 2
  Filled 2013-02-03: qty 1
  Filled 2013-02-03 (×4): qty 2
  Filled 2013-02-03: qty 1
  Filled 2013-02-03 (×3): qty 2

## 2013-02-03 MED ORDER — FENTANYL CITRATE 0.05 MG/ML IJ SOLN
INTRAMUSCULAR | Status: AC
  Administered 2013-02-03: 25 ug via INTRAVENOUS
  Filled 2013-02-03: qty 2

## 2013-02-03 MED ORDER — FENTANYL CITRATE 0.05 MG/ML IJ SOLN
25.0000 ug | INTRAMUSCULAR | Status: DC | PRN
  Administered 2013-02-03 (×3): 25 ug via INTRAVENOUS

## 2013-02-03 MED ORDER — PROPOFOL 10 MG/ML IV EMUL
INTRAVENOUS | Status: AC
  Filled 2013-02-03: qty 20

## 2013-02-03 MED ORDER — ACETAMINOPHEN 650 MG RE SUPP
650.0000 mg | Freq: Once | RECTAL | Status: AC
  Administered 2013-02-03: 650 mg via RECTAL
  Filled 2013-02-03: qty 1

## 2013-02-03 MED ORDER — KETOROLAC TROMETHAMINE 30 MG/ML IJ SOLN
INTRAMUSCULAR | Status: AC
  Filled 2013-02-03: qty 1

## 2013-02-03 MED ORDER — MIDAZOLAM HCL 5 MG/5ML IJ SOLN
INTRAMUSCULAR | Status: DC | PRN
  Administered 2013-02-03: 2 mg via INTRAVENOUS

## 2013-02-03 MED ORDER — MIDAZOLAM HCL 2 MG/2ML IJ SOLN
INTRAMUSCULAR | Status: AC
  Filled 2013-02-03: qty 2

## 2013-02-03 MED ORDER — ONDANSETRON HCL 4 MG/2ML IJ SOLN
INTRAMUSCULAR | Status: DC | PRN
  Administered 2013-02-03: 4 mg via INTRAVENOUS

## 2013-02-03 MED ORDER — ONDANSETRON HCL 4 MG PO TABS
4.0000 mg | ORAL_TABLET | Freq: Four times a day (QID) | ORAL | Status: DC | PRN
  Administered 2013-02-10 – 2013-02-12 (×2): 4 mg via ORAL
  Filled 2013-02-03 (×2): qty 1

## 2013-02-03 MED ORDER — PNEUMOCOCCAL VAC POLYVALENT 25 MCG/0.5ML IJ INJ
0.5000 mL | INJECTION | INTRAMUSCULAR | Status: AC
  Administered 2013-02-04: 0.5 mL via INTRAMUSCULAR
  Filled 2013-02-03: qty 0.5

## 2013-02-03 MED ORDER — FENTANYL CITRATE 0.05 MG/ML IJ SOLN
INTRAMUSCULAR | Status: AC
  Filled 2013-02-03: qty 2

## 2013-02-03 MED ORDER — IOHEXOL 300 MG/ML  SOLN
100.0000 mL | Freq: Once | INTRAMUSCULAR | Status: AC | PRN
  Administered 2013-02-03: 100 mL via INTRAVENOUS

## 2013-02-03 MED ORDER — LACTATED RINGERS IV SOLN
INTRAVENOUS | Status: DC
  Administered 2013-02-03 (×2): via INTRAVENOUS

## 2013-02-03 MED ORDER — IOHEXOL 300 MG/ML  SOLN: 50.0000 mL | INTRAMUSCULAR | Status: AC

## 2013-02-03 MED ORDER — KETOROLAC TROMETHAMINE 30 MG/ML IJ SOLN
30.0000 mg | Freq: Four times a day (QID) | INTRAMUSCULAR | Status: AC
  Filled 2013-02-03 (×2): qty 1

## 2013-02-03 MED ORDER — DOXYCYCLINE HYCLATE 100 MG IV SOLR
100.0000 mg | Freq: Two times a day (BID) | INTRAVENOUS | Status: DC
  Administered 2013-02-03 – 2013-02-07 (×9): 100 mg via INTRAVENOUS
  Filled 2013-02-03 (×11): qty 100

## 2013-02-03 MED ORDER — TEMAZEPAM 15 MG PO CAPS: 15.0000 mg | ORAL_CAPSULE | Freq: Every evening | ORAL | Status: DC | PRN

## 2013-02-03 MED ORDER — LIDOCAINE HCL (CARDIAC) 20 MG/ML IV SOLN
INTRAVENOUS | Status: DC | PRN
  Administered 2013-02-03: 30 mg via INTRAVENOUS

## 2013-02-03 MED ORDER — KETOROLAC TROMETHAMINE 30 MG/ML IJ SOLN
30.0000 mg | Freq: Four times a day (QID) | INTRAMUSCULAR | Status: AC
  Administered 2013-02-03 – 2013-02-08 (×17): 30 mg via INTRAVENOUS
  Filled 2013-02-03 (×16): qty 1

## 2013-02-03 MED ORDER — BUPIVACAINE-EPINEPHRINE (PF) 0.5% -1:200000 IJ SOLN
INTRAMUSCULAR | Status: AC
  Filled 2013-02-03: qty 10

## 2013-02-03 MED ORDER — PROPOFOL 10 MG/ML IV EMUL
INTRAVENOUS | Status: DC | PRN
  Administered 2013-02-03: 150 mg via INTRAVENOUS

## 2013-02-03 MED ORDER — DOXYCYCLINE HYCLATE 100 MG IV SOLR
200.0000 mg | INTRAVENOUS | Status: AC
  Administered 2013-02-03: 200 mg via INTRAVENOUS
  Filled 2013-02-03: qty 200

## 2013-02-03 MED ORDER — LACTATED RINGERS IV SOLN
INTRAVENOUS | Status: DC
  Administered 2013-02-03: 11:00:00 via INTRAVENOUS

## 2013-02-03 MED ORDER — ONDANSETRON HCL 4 MG/2ML IJ SOLN
4.0000 mg | Freq: Four times a day (QID) | INTRAMUSCULAR | Status: DC | PRN
  Administered 2013-02-05 – 2013-02-12 (×3): 4 mg via INTRAVENOUS
  Filled 2013-02-03 (×3): qty 2

## 2013-02-03 MED ORDER — 0.9 % SODIUM CHLORIDE (POUR BTL) OPTIME
TOPICAL | Status: DC | PRN
  Administered 2013-02-03: 1000 mL

## 2013-02-03 MED ORDER — LIDOCAINE HCL (CARDIAC) 20 MG/ML IV SOLN
INTRAVENOUS | Status: AC
  Filled 2013-02-03: qty 5

## 2013-02-03 MED ORDER — DEXTROSE 5 % IV SOLN
1.0000 g | Freq: Two times a day (BID) | INTRAVENOUS | Status: DC
  Administered 2013-02-03 – 2013-02-06 (×6): 1 g via INTRAVENOUS
  Filled 2013-02-03 (×7): qty 1

## 2013-02-03 MED ORDER — KETOROLAC TROMETHAMINE 30 MG/ML IJ SOLN
INTRAMUSCULAR | Status: DC | PRN
  Administered 2013-02-03: 30 mg via INTRAVENOUS

## 2013-02-03 SURGICAL SUPPLY — 20 items
CATH ROBINSON RED A/P 16FR (CATHETERS) ×2 IMPLANT
CLOTH BEACON ORANGE TIMEOUT ST (SAFETY) ×2 IMPLANT
DECANTER SPIKE VIAL GLASS SM (MISCELLANEOUS) IMPLANT
GLOVE BIO SURGEON STRL SZ 6.5 (GLOVE) ×2 IMPLANT
GLOVE BIOGEL PI IND STRL 7.0 (GLOVE) ×1 IMPLANT
GLOVE BIOGEL PI INDICATOR 7.0 (GLOVE) ×1
GOWN STRL REIN XL XLG (GOWN DISPOSABLE) ×4 IMPLANT
KIT BERKELEY 1ST TRIMESTER 3/8 (MISCELLANEOUS) ×2 IMPLANT
NEEDLE SPNL 22GX3.5 QUINCKE BK (NEEDLE) ×2 IMPLANT
NS IRRIG 1000ML POUR BTL (IV SOLUTION) ×2 IMPLANT
PACK VAGINAL MINOR WOMEN LF (CUSTOM PROCEDURE TRAY) ×2 IMPLANT
PAD OB MATERNITY 4.3X12.25 (PERSONAL CARE ITEMS) ×2 IMPLANT
PAD PREP 24X48 CUFFED NSTRL (MISCELLANEOUS) ×2 IMPLANT
SET BERKELEY SUCTION TUBING (SUCTIONS) ×2 IMPLANT
SYR CONTROL 10ML LL (SYRINGE) ×2 IMPLANT
TOWEL OR 17X24 6PK STRL BLUE (TOWEL DISPOSABLE) ×4 IMPLANT
VACURETTE 10 RIGID CVD (CANNULA) IMPLANT
VACURETTE 7MM CVD STRL WRAP (CANNULA) IMPLANT
VACURETTE 8 RIGID CVD (CANNULA) IMPLANT
VACURETTE 9 RIGID CVD (CANNULA) ×2 IMPLANT

## 2013-02-03 NOTE — Anesthesia Preprocedure Evaluation (Signed)
Anesthesia Evaluation  Patient identified by MRN, date of birth, ID band Patient awake    Reviewed: Allergy & Precautions, H&P , Patient's Chart, lab work & pertinent test results, reviewed documented beta blocker date and time   Airway Mallampati: II TM Distance: >3 FB Neck ROM: full    Dental no notable dental hx.    Pulmonary  breath sounds clear to auscultation  Pulmonary exam normal       Cardiovascular Rhythm:regular Rate:Normal     Neuro/Psych    GI/Hepatic   Endo/Other    Renal/GU      Musculoskeletal   Abdominal   Peds  Hematology   Anesthesia Other Findings Patient tachycardic and febrile. One BP was 85 syst...Marland Kitcheno/w okay Pt alert and oriented. Skin warm , dry. No adventitious lung sounds. RA sat 96%. No productive cough. WBC  37.4 Patient  septic; discussed w/ Dr Debroah Loop.  Reproductive/Obstetrics                           Anesthesia Physical Anesthesia Plan  ASA: III  Anesthesia Plan: General   Post-op Pain Management:    Induction: Intravenous  Airway Management Planned: LMA  Additional Equipment:   Intra-op Plan:   Post-operative Plan:   Informed Consent: I have reviewed the patients History and Physical, chart, labs and discussed the procedure including the risks, benefits and alternatives for the proposed anesthesia with the patient or authorized representative who has indicated his/her understanding and acceptance.   Dental Advisory Given  Plan Discussed with: CRNA and Surgeon  Anesthesia Plan Comments: (  Discussed  general anesthesia, including possible nausea, instrumentation of airway, sore throat,pulmonary aspiration, etc. I asked if the were any outstanding questions, or  concerns before we proceeded. )        Anesthesia Quick Evaluation

## 2013-02-03 NOTE — Anesthesia Postprocedure Evaluation (Signed)
  Anesthesia Post-op Note  Patient: Erica [name]  Procedure(s) Performed: Procedure(s): DILATATION AND EVACUATION (N/A)  Patient Location: PACU  Anesthesia Type:General  Level of Consciousness: awake, alert  and oriented  Airway and Oxygen Therapy: Patient Spontanous Breathing  Post-op Pain: none  Post-op Assessment: Post-op Vital signs reviewed, Patient's Cardiovascular Status Stable, Respiratory Function Stable, Patent Airway, No signs of Nausea or vomiting and Pain level controlled  Post-op Vital Signs: Reviewed and stable  Complications: No apparent anesthesia complications

## 2013-02-03 NOTE — Transfer of Care (Signed)
Immediate Anesthesia Transfer of Care Note  Patient: Erica [name]  Procedure(s) Performed: Procedure(s): DILATATION AND EVACUATION (N/A)  Patient Location: PACU  Anesthesia Type:General  Level of Consciousness: awake  Airway & Oxygen Therapy: Patient Spontanous Breathing and Patient connected to nasal cannula oxygen  Post-op Assessment: Report given to PACU RN and Post -op Vital signs reviewed and stable  Post vital signs: Reviewed and stable  Complications: No apparent anesthesia complications

## 2013-02-03 NOTE — Anesthesia Postprocedure Evaluation (Signed)
Anesthesia Post Note  Patient: Erica [name]  Procedure(s) Performed: Procedure(s) (LRB): DILATATION AND EVACUATION (N/A)  Anesthesia type: General  Patient location: Women's Unit  Post pain: Pain level controlled  Post assessment: Post-op Vital signs reviewed  Last Vitals:  Filed Vitals:   02/03/13 1332  BP: 106/73  Pulse:   Temp: 37.1 C  Resp: 20    Post vital signs: Reviewed  Level of consciousness: awake  Complications: No apparent anesthesia complications Anesthesia Post-op Note  Patient: Erica [name]  Procedure(s) Performed: Procedure(s): DILATATION AND EVACUATION (N/A)  Patient Location: PACU and Women's Unit  Anesthesia Type:General  Level of Consciousness: awake, alert , oriented and patient cooperative  Airway and Oxygen Therapy: Patient Spontanous Breathing and Patient connected to nasal cannula oxygen  Post-op Pain: none  Post-op Assessment: Post-op Vital signs reviewed  Post-op Vital Signs: Reviewed and stable  Complications: No apparent anesthesia complications

## 2013-02-03 NOTE — Op Note (Signed)
Procedure: Suction dilation and curettage Preoperative diagnosis: Incomplete abortion after Cytotec therapy. Suspect septic abortion. Postoperative diagnosis: Same, with pelvic mass identified on examination under anesthesia. Surgeon: Dr. Scheryl Darter Anesthesia: LMA and intracervical block with local anesthetic Specimen: Products of conception Findings: 6 cm pelvic mass posterior to the uterus identified on pelvic examination Estimated blood loss: Negligible Complications: None Counts: Correct   Patient gave written consent for suction dilation and curettage after being treated with Cytotec for a failed pregnancy at [redacted] weeks gestation. The patient presented for surgery today she complained of lower bowel pain and had fever to 102.4. She was suspected to have a septic abortion. Prior to surgery she received IV cefotetan and and IV doxycycline. LMA anesthesia was induced. She's placed in dorsal lithotomy position. Bladder was drained with red rubber catheter and perineum and vagina were sterilely prepped and draped. Exam under anesthesia revealed anteverted uterus about 6-8 weeks size. There appeared to be a bulge in the posterior vagina that could have represented stool in the rectum. Speculum was inserted and half percent Marcaine with 1 200,000 epinephrine was infiltrated for intracervical block, 10 mL. Cervix was grasped with a single-tooth tenaculum and dilated sufficiently to pass a 9 mm suction curet. The curet half to 9 cm. Small amount of products of conception were obtained. Was minimal bleeding. Complete evacuation of the uterine cavity was assured. All instruments were removed rectovaginal exam was performed and a 6 cm mass in the cul-de-sac was identified. Patient tolerated the procedure without complications and she was brought in stable condition to PACU. She was to be observed postoperatively and a box were ordered. A pelvic ultrasound was ordered   Dr. Scheryl Darter 02/03/2013 12:22  PM

## 2013-02-03 NOTE — Progress Notes (Signed)
Accompanied pt to CT where she became very dyspneic with labored rapid breathing when spoitioned supine. Unable to proceed with exam as pt can not tolerate supine. Dr. Debroah Loop called; orders obtained for stat CXR.

## 2013-02-03 NOTE — Progress Notes (Signed)
UR completed 

## 2013-02-03 NOTE — H&P (Signed)
Erica [name] is an 37 y.o. female. G4P3 Patient's last menstrual period was 11/27/2012. [redacted]w[redacted]d Was diagnosed with failed IUP 7/14 at 6 weeks 5 days, was treated with cytotec x 2 with sign, symptoms of retained POC, for sution D&C today. She was seen in clinic yesterday, complains of increasing low abdominal pain and low back pain, now with fever. Scant bleeding, no foul discharge  Pertinent Gynecological History:  Bleeding: light   DES exposure: denies Blood transfusions: none  Previous GYN Procedures: none    OB History: G4, P3   Menstrual History:  Patient's last menstrual period was 11/27/2012.    Past Medical History  Diagnosis Date  . Medical history non-contributory     Past Surgical History  Procedure Laterality Date  . Cesarean section      No family history on file.  Social History:  reports that she has been smoking.  She does not have any smokeless tobacco history on file. She reports that she does not drink alcohol or use illicit drugs.  Allergies:  Allergies  Allergen Reactions  . Strawberry Swelling    Prescriptions prior to admission  Medication Sig Dispense Refill  . promethazine (PHENERGAN) 25 MG tablet Take 1 tablet (25 mg total) by mouth every 6 (six) hours as needed for nausea.  60 tablet  0  . acetaminophen (TYLENOL) 500 MG tablet Take 1,000 mg by mouth every 6 (six) hours as needed for pain.       Marland Kitchen ibuprofen (ADVIL,MOTRIN) 800 MG tablet Take 1 tablet (800 mg total) by mouth every 8 (eight) hours as needed for pain.  30 tablet  0  . oxyCODONE-acetaminophen (PERCOCET/ROXICET) 5-325 MG per tablet Take 2 tablets by mouth every 8 (eight) hours as needed for pain.        Review of Systems  Constitutional: Positive for fever and chills.  Respiratory: Negative for cough, sputum production and shortness of breath.   Gastrointestinal: Positive for abdominal pain.  Genitourinary: Negative for dysuria and flank pain.  Skin: Negative for rash.     Temperature 102.4 F (39.1 C), last menstrual period 11/27/2012. Physical Exam  Constitutional: She is oriented to person, place, and time. She appears distressed.  HENT:  Head: Normocephalic.  Neck: Normal range of motion.  Cardiovascular: Normal heart sounds.   No murmur heard. tachycardic  Respiratory: Breath sounds normal. She has no wheezes. She has no rales.  GI: Soft. She exhibits no mass. There is tenderness (mild lower abdomen ). There is no guarding.  Genitourinary:  cervix open on exam 02/02/13  Neurological: She is alert and oriented to person, place, and time.  Skin: Skin is warm and dry. She is not diaphoretic.  Psychiatric:  anxious   CBC    Component Value Date/Time   WBC 37.4* 02/03/2013 1011   RBC 3.29* 02/03/2013 1011   HGB 9.9* 02/03/2013 1011   HCT 28.7* 02/03/2013 1011   PLT 397 02/03/2013 1011   MCV 87.2 02/03/2013 1011   MCH 30.1 02/03/2013 1011   MCHC 34.5 02/03/2013 1011   RDW 14.7 02/03/2013 1011      No results found for this or any previous visit (from the past 24 hour(s)).  No results found.  Assessment/Plan: Incomplete abortion, suspect septic Suction D&C. The procedure and risk of anesthesia, infection, uterine damage, bleeding were discussed and her questions were answered. She will receive antibiotics and we will observe her postoperatively.  Taichi Repka,[place] 02/03/2013, 10:32 AM

## 2013-02-03 NOTE — Progress Notes (Signed)
Patient ID: Erica [name], female   DOB: [birth year], 37 y.o.   MRN: [id number] Complex pelvic mass on [place], will f/u with CT scan.  Adam Phenix, MD 02/03/2013 4:21 PM

## 2013-02-04 ENCOUNTER — Inpatient Hospital Stay (HOSPITAL_COMMUNITY): Payer: Medicaid Other

## 2013-02-04 LAB — CBC
Hemoglobin: 8.1 g/dL — ABNORMAL LOW (ref 12.0–15.0)
RBC: 2.67 MIL/uL — ABNORMAL LOW (ref 3.87–5.11)

## 2013-02-04 LAB — HCG, QUANTITATIVE, PREGNANCY: hCG, Beta Chain, Quant, S: 64 m[IU]/mL — ABNORMAL HIGH (ref <5)

## 2013-02-04 NOTE — Progress Notes (Signed)
Dr. Marice Potter notified of patients oxygen saturation of 85% on room air and pulse of 128.  Orders received.  Patients breath sounds decreased bilaterally in lower lobes.

## 2013-02-04 NOTE — Progress Notes (Signed)
Subjective:less pain, breathing ok. Minimal bleeding Patient reports tolerating PO and no problems voiding.    Objective: I have reviewed patient's vital signs, medications, labs and radiology results.  General: alert, cooperative and no distress GI: soft, non-tender; bowel sounds normal; no masses,  no organomegaly Vaginal Bleeding: minimal *RADIOLOGY REPORT*  Clinical Data: D and C procedure earlier today. Abnormal  ultrasound examination earlier today questioning a pelvic abscess.  CT ABDOMEN AND PELVIS WITH CONTRAST  Technique: Multidetector CT imaging of the abdomen and pelvis was  performed following the standard protocol during bolus  administration of intravenous contrast.  Contrast: OMNIPAQUE IOHEXOL 300 MG/ML IV. Oral contrast was  also administered.  Comparison: Pelvic ultrasound earlier same date. No prior CT.  Findings: Approximate 10.2 x 5.1 x 5.4 cm heterogeneous collection  in the left side of the pelvis, with extension into the presacral  space in the lower pelvis and extension upward into the  retroperitoneum of the left upper pelvis overlying the left psoas  muscle. Small amount of simple free fluid in the cul-de-sac.  Uterus displaced slightly rightward by the collection. Small  amount of endometrial fluid. No free intraperitoneal air.  Upper normal-sized liver without focal parenchymal abnormality.  Layering sludge in the gallbladder without visible gallstones. No  biliary ductal dilation. Normal-appearing spleen, pancreas,  adrenal glands, and kidneys. Normal-appearing vascular structures.  No significant lymphadenopathy.  Stomach normal in appearance, filled with food. Normal-appearing  small bowel and colon. Appendix not clearly visualized, but no  pericecal inflammation.  Bone window images unremarkable. Atelectasis in the visualized  lower lobes. Heart size normal.  IMPRESSION:  1. Hematoma (favored over an abscess) in the left side of the   pelvis, with extension into the presacral space of the low pelvis  and the retroperitoneum of the upper pelvis.  2. Small amount of simple free fluid in the cul-de-sac.  3. Layering sludge within the gallbladder. No calcified  gallstones.  4. Atelectasis in the visualized lower lobes.  Original Report Authenticated By: Hulan Saas, M.D.       CBC    Component Value Date/Time   WBC 28.1* 02/04/2013 0545   RBC 2.67* 02/04/2013 0545   HGB 8.1* 02/04/2013 0545   HCT 23.6* 02/04/2013 0545   PLT 348 02/04/2013 0545   MCV 88.4 02/04/2013 0545   MCH 30.3 02/04/2013 0545   MCHC 34.3 02/04/2013 0545   RDW 15.2 02/04/2013 0545      Assessment/Plan: POD 1 D&C incomplete abortion Suspect pelvic abscess origin uncertain, clinically hematoma less likely Continue current Abx  LOS: 1 day    Erica [name],[place] 02/04/2013, 7:33 AM

## 2013-02-05 LAB — URINE CULTURE: Colony Count: >=100000

## 2013-02-05 LAB — CBC
HCT: 22.9 % — ABNORMAL LOW (ref 36.0–46.0)
Hemoglobin: 7.8 g/dL — ABNORMAL LOW (ref 12.0–15.0)
WBC: 22.9 10*3/uL — ABNORMAL HIGH (ref 4.0–10.5)

## 2013-02-05 MED ORDER — IBUPROFEN 600 MG PO TABS
600.0000 mg | ORAL_TABLET | Freq: Four times a day (QID) | ORAL | Status: DC | PRN
  Administered 2013-02-05 – 2013-02-13 (×8): 600 mg via ORAL
  Filled 2013-02-05 (×8): qty 1

## 2013-02-05 NOTE — Progress Notes (Signed)
Subjective: Patient reports tolerating PO, + flatus and no problems voiding.    Objective: I have reviewed patient's vital signs, intake and output, medications and labs.  General: alert Resp: clear to auscultation bilaterally Cardio: regular rate and rhythm, S1, S2 normal, no murmur, click, rub or gallop GI: soft, non-tender; bowel sounds normal; no masses,  no organomegaly, with palpation, she says that her pelvic pain is slightly less than yesterday. CXR- worsening right atelectasis O2 sat recently 94%  Assessment/Plan: POD #2 s/p d&c for missed ab Pelvic abscess- I have ordered a CBC for this morning but she appears to be improving clinically.  LOS: 2 days    Erica [name] C. 02/05/2013, 7:20 AM

## 2013-02-06 ENCOUNTER — Encounter (HOSPITAL_COMMUNITY): Payer: Self-pay | Admitting: Obstetrics & Gynecology

## 2013-02-06 DIAGNOSIS — O035 Genital tract and pelvic infection following complete or unspecified spontaneous abortion: Secondary | ICD-10-CM | POA: Diagnosis present

## 2013-02-06 MED ORDER — PIPERACILLIN-TAZOBACTAM 3.375 G IVPB
3.3750 g | Freq: Three times a day (TID) | INTRAVENOUS | Status: DC
  Administered 2013-02-06 – 2013-02-13 (×21): 3.375 g via INTRAVENOUS
  Filled 2013-02-06 (×23): qty 50

## 2013-02-06 NOTE — Progress Notes (Signed)
3 Days Post-Op Procedure([name]) (LRB): DILATATION AND EVACUATION (N/A) With Pelvic abscess  Subjective: Patient reports tolerating PO, + flatus and no problems voiding.    Objective: Filed Vitals:   02/06/13 0600  BP: 107/76  Pulse: 100  Temp: 98.3 F (36.8 C)  Resp: 18  T max 100.4 at 1750.  I have reviewed patient'[name] vital signs, intake and output, medications and labs.  General: alert, cooperative and appears stated age GI: soft, non-tender; bowel sounds normal; no masses,  no organomegaly Extremities: extremities normal, atraumatic, no cyanosis or edema  Assessment: [name]/p Procedure([name]): DILATATION AND EVACUATION (N/A): stable, progressing well, tolerating diet and fever  Plan: Encourage ambulation continue antibiotics until afebrile.  Home with po anti-biotics.  LOS: 3 days    Erica [name] 02/06/2013, 7:46 AM

## 2013-02-07 ENCOUNTER — Inpatient Hospital Stay (HOSPITAL_COMMUNITY): Payer: Medicaid Other

## 2013-02-07 LAB — CBC
Hemoglobin: 7.4 g/dL — ABNORMAL LOW (ref 12.0–15.0)
MCH: 30 pg (ref 26.0–34.0)
MCV: 87 fL (ref 78.0–100.0)
RBC: 2.47 MIL/uL — ABNORMAL LOW (ref 3.87–5.11)

## 2013-02-07 MED ORDER — DOCUSATE SODIUM 100 MG PO CAPS
100.0000 mg | ORAL_CAPSULE | Freq: Two times a day (BID) | ORAL | Status: DC
  Administered 2013-02-07 – 2013-02-12 (×11): 100 mg via ORAL
  Filled 2013-02-07 (×11): qty 1

## 2013-02-07 MED ORDER — IOHEXOL 300 MG/ML  SOLN
100.0000 mL | Freq: Once | INTRAMUSCULAR | Status: AC | PRN
  Administered 2013-02-07: 100 mL via INTRAVENOUS

## 2013-02-07 MED ORDER — IOHEXOL 300 MG/ML  SOLN
50.0000 mL | INTRAMUSCULAR | Status: AC
  Administered 2013-02-07 (×2): 50 mL via ORAL

## 2013-02-07 NOTE — Progress Notes (Signed)
Patient ID: Erica [name], female   DOB: 01/26/1976, 37 y.o.   MRN: [id number] 4 Days Post-Op Procedure(s) (LRB): DILATATION AND EVACUATION (N/A) With Pelvic abscess  Subjective: Patient reports tolerating PO, + flatus and no problems voiding.  Patient states pain is slowly improving.  Objective: Filed Vitals:   02/07/13 0546  BP: 151/84  Pulse: 112  Temp: 100.1 F (37.8 C)  Resp: 16  T max 102.1 at 1550.  I have reviewed patient's vital signs, intake and output, medications and labs.  General: alert, cooperative and appears stated age GI: soft, non-tender; bowel sounds normal; no masses,  no organomegaly Extremities: extremities normal, atraumatic, no cyanosis or edema  Assessment: s/p Procedure(s): DILATATION AND EVACUATION (N/A): stable, progressing well, tolerating diet and fever  Plan: Encourage ambulation continue IV antibiotics until afebrile.   WBC slowly normalizing D/C planning when 48 hours afebrile Consider IR drainage if spike another fever  LOS: 4 days    Damani Kelemen 02/07/2013, 6:45 AM

## 2013-02-08 ENCOUNTER — Ambulatory Visit (HOSPITAL_COMMUNITY): Payer: Medicaid Other

## 2013-02-08 DIAGNOSIS — N739 Female pelvic inflammatory disease, unspecified: Secondary | ICD-10-CM | POA: Diagnosis present

## 2013-02-08 LAB — CBC
HCT: 22.4 % — ABNORMAL LOW (ref 36.0–46.0)
MCHC: 33.9 g/dL (ref 30.0–36.0)
MCV: 86.8 fL (ref 78.0–100.0)
RDW: 15.4 % (ref 11.5–15.5)
WBC: 21 10*3/uL — ABNORMAL HIGH (ref 4.0–10.5)

## 2013-02-08 LAB — PROTIME-INR
INR: 1.22 (ref 0.00–1.49)
Prothrombin Time: 15.1 seconds (ref 11.6–15.2)

## 2013-02-08 MED ORDER — DIPHENHYDRAMINE HCL 25 MG PO CAPS
25.0000 mg | ORAL_CAPSULE | Freq: Once | ORAL | Status: AC
  Administered 2013-02-08: 25 mg via ORAL
  Filled 2013-02-08: qty 1

## 2013-02-08 MED ORDER — FUROSEMIDE 10 MG/ML IJ SOLN
20.0000 mg | Freq: Once | INTRAMUSCULAR | Status: AC
  Administered 2013-02-09: 20 mg via INTRAVENOUS
  Filled 2013-02-08: qty 2

## 2013-02-08 MED ORDER — HYDROMORPHONE HCL PF 1 MG/ML IJ SOLN
1.0000 mg | INTRAMUSCULAR | Status: DC | PRN
  Administered 2013-02-08 – 2013-02-10 (×10): 1 mg via INTRAVENOUS
  Administered 2013-02-11 (×3): 2 mg via INTRAVENOUS
  Administered 2013-02-12: 1 mg via INTRAVENOUS
  Filled 2013-02-08: qty 1
  Filled 2013-02-08: qty 2
  Filled 2013-02-08 (×7): qty 1
  Filled 2013-02-08: qty 2
  Filled 2013-02-08 (×2): qty 1
  Filled 2013-02-08: qty 2
  Filled 2013-02-08: qty 1

## 2013-02-08 MED ORDER — ACETAMINOPHEN 500 MG PO TABS
1000.0000 mg | ORAL_TABLET | Freq: Four times a day (QID) | ORAL | Status: DC | PRN
  Administered 2013-02-08 – 2013-02-09 (×2): 1000 mg via ORAL
  Filled 2013-02-08 (×3): qty 2

## 2013-02-08 MED ORDER — DOXYCYCLINE HYCLATE 100 MG PO TABS
100.0000 mg | ORAL_TABLET | Freq: Two times a day (BID) | ORAL | Status: DC
  Administered 2013-02-08 – 2013-02-13 (×10): 100 mg via ORAL
  Filled 2013-02-08 (×12): qty 1

## 2013-02-08 MED ORDER — MIDAZOLAM HCL 2 MG/2ML IJ SOLN
INTRAMUSCULAR | Status: AC | PRN
  Administered 2013-02-08: 1 mg via INTRAVENOUS
  Administered 2013-02-08: 2 mg via INTRAVENOUS
  Administered 2013-02-08 (×2): 1 mg via INTRAVENOUS
  Administered 2013-02-08: 2 mg via INTRAVENOUS

## 2013-02-08 MED ORDER — ACETAMINOPHEN 325 MG PO TABS
650.0000 mg | ORAL_TABLET | Freq: Once | ORAL | Status: DC
  Filled 2013-02-08: qty 2

## 2013-02-08 MED ORDER — FENTANYL CITRATE 0.05 MG/ML IJ SOLN
INTRAMUSCULAR | Status: AC | PRN
  Administered 2013-02-08: 50 ug via INTRAVENOUS
  Administered 2013-02-08: 100 ug via INTRAVENOUS
  Administered 2013-02-08: 50 ug via INTRAVENOUS
  Administered 2013-02-08: 100 ug via INTRAVENOUS
  Administered 2013-02-08 (×2): 50 ug via INTRAVENOUS

## 2013-02-08 NOTE — Progress Notes (Signed)
UR completed 

## 2013-02-08 NOTE — ED Notes (Signed)
Reported to [place], Charity fundraiser at [facility].  Will notify Rosey Bath, RN when carelink arrives to transport patient back to [facility].

## 2013-02-08 NOTE — Progress Notes (Signed)
Subjective: Patient reports abdominal pain with movement.  Pain not worse.    Objective: I have reviewed patient's vital signs, medications, labs, pathology and radiology results.  General: alert, cooperative and no distress Resp: clear to auscultation bilaterally Cardio: regular rate and rhythm GI: soft, mildly tender L>R, no rebound, no guarding Extremities: no edema, redness or tenderness in the calves or thighs Vaginal Bleeding: none  Clinical Data: Fever. Elevated white blood cell count. Status post  dilatation and curettage 02/03/2013.  CT ABDOMEN AND PELVIS WITH CONTRAST  Technique: Multidetector CT imaging of the abdomen and pelvis was  performed following the standard protocol during bolus  administration of intravenous contrast.  Contrast: OMNIPAQUE IOHEXOL 300 MG/ML SOLN  Comparison: CT abdomen and pelvis 02/03/2013.  Findings: Basilar airspace opacities, worse on the right, are again  seen and appear slightly improved. No pleural or pericardial  effusion is identified.  The fluid collection in the left hemi pelvis seen on the prior  study appears more organized on today's examination with rim  enhancement present. The collection measures 5.7 cm A P by 4.4 cm  transverse by 3.5 cm cranial-caudal. This collection may be within  the ovary. A second collection is seen in the cul-de-sac measuring  6.2 cm transverse by 4.4 cm AP by 3.1 cm cranial-caudal. There is  some rim enhancement about this collection.  The liver, gallbladder, adrenal glands, spleen, pancreas and  kidneys appear normal. Urinary bladder and uterus are  unremarkable. Degenerative disease is seen about the right hip.  No gross bony lesion.  IMPRESSION:  Rim enhancing fluid collections within or adjacent to the left  ovary and in the cul-de-sac highly suspicious for abscesses.  CBC    Component Value Date/Time   WBC 19.5* 02/07/2013 0525   RBC 2.47* 02/07/2013 0525   HGB 7.4* 02/07/2013 0525   HCT  21.5* 02/07/2013 0525   PLT 398 02/07/2013 0525   MCV 87.0 02/07/2013 0525   MCH 30.0 02/07/2013 0525   MCHC 34.4 02/07/2013 0525   RDW 15.6* 02/07/2013 0525      Assessment/Plan: 37 yo female with pelvic abscess present on admission.  Pt also s/p D & E for missed abortion.  1-CT shows 2 collections most liekly to be abscesses.  Will contact IR for drainage 2-Continue antibiotics 3-SCDs while in bed and ambulate 4-Hgb is 7.4  Will discuss with patient transfusion.  Pt will heal better with higher hemoglobin.   LOS: 5 days    Erica [name]. 02/08/2013, 6:08 AM

## 2013-02-08 NOTE — Procedures (Signed)
Technically successful CT guided pelvic drainage catheter placement via right TG approach yielding 55 cc of purulent material.   Technically successful CT guided pelvic drainage catheter placement via LLQ approach yielding 45 cc of purulent material. Samples from both drainage catheters sent to laboratory.   No immediate post procedural complications.

## 2013-02-08 NOTE — ED Notes (Signed)
Patient is in CT suite.

## 2013-02-08 NOTE — Progress Notes (Signed)
Patient ID: Erica [name], female   DOB: 1976-06-02, 37 y.o.   MRN: [id number] Request received for CT guided drainage of pelvic fluid collections in pt . PMH sig for recent D&E 8/1 for missed abortion, persistent leukocytosis, fever and imaging revealing pelvic fluid collections concerning for abscesses. Case reviewed by Dr. Grace Isaac. Additional PMH as below. Exam: pt awake/alert; chest- CTA bilat; heart- sl tachy but reg rhythm; abd- soft,+BS, tender L/R pelvic/suprapubic regions; ext- FROM, 1-2+ edema bilat.    Filed Vitals:   02/08/13 0157 02/08/13 0554 02/08/13 1117 02/08/13 1306  BP: 135/76 144/95 154/93 124/73  Pulse: 97 106 106   Temp: 98.2 F (36.8 C) 98.7 F (37.1 C) 99.3 F (37.4 C)   TempSrc: Oral Oral Oral   Resp: 16 16 20 16   Height:      Weight:      SpO2: 97% 99% 96% 99%    Past Surgical History  Procedure Laterality Date  . Cesarean section    . Dilation and evacuation N/A 02/03/2013    Procedure: DILATATION AND EVACUATION;  Surgeon: Adam Phenix, MD;  Location: [facility];  Service: Gynecology;  Laterality: N/A;  Dg Chest 2 View  02/04/2013   *RADIOLOGY REPORT*  Clinical Data: Spontaneous abortion with D&C yesterday.  The patient now has chest pain and shortness of breath.  CHEST - 2 VIEW  Comparison: Two-view chest x-ray yesterday.  Findings: Cardiac silhouette upper normal in size to slightly enlarged.  Hilar and mediastinal contours unremarkable.  Interval development of a focal airspace opacity in the right lower lobe superimposed upon the linear atelectasis identified yesterday. This focal opacity also has a linear configuration.  Linear atelectasis in the left lower lobe and lingula, unchanged. Pulmonary vascularity normal without evidence of pulmonary edema. No pleural effusions.  Visualized bony thorax intact.  IMPRESSION: Borderline heart size.  Worsening atelectasis in the right lower lobe.  Stable atelectasis in the left lower lobe.   Original Report Authenticated By:  Hulan Saas, M.D.   Dg Chest 2 View  02/03/2013   *RADIOLOGY REPORT*  Clinical Data: Smoker.  Chest pain.  CHEST - 2 VIEW  Comparison: None.  Findings: No cardiomegaly when accounting for low lung volumes. The upper mediastinal contours are unremarkable.  Low volume lungs with streaky lower opacities.  Negative for effusion or pneumothorax.  No acute osseous findings.  IMPRESSION:  Low lung volumes with bibasilar atelectasis.   Original Report Authenticated By: Tiburcio Pea   US Ob Comp Less 14 Wks  01/13/2013   OBSTETRICAL ULTRASOUND: This exam was performed within a [facility]. The OB US report was generated in the [facility] system, and faxed to the ordering physician.   This report is also available in [employer] and in the [employer]. See [facility] Obstetric US report.  US Ob Transvaginal  01/26/2013   *RADIOLOGY REPORT*  Clinical Data: Recent missed abortion.  Persistent bleeding. Question retained products of conception.  TRANSVAGINAL OB ULTRASOUND  Technique:  Transvaginal ultrasound was performed for evaluation of the gestation as well as the maternal uterus and adnexal regions.  Comparison: Transvaginal obstetric ultrasound 01/13/2013.  Findings: No gestational sac is identified.  The endometrial cavity is expanded within the lower uterine segment with heterogeneous echogenicity suggesting retained products of conception.  The adnexa are within normal limits bilaterally.  No significant free fluid is present.  IMPRESSION:  1.  Heterogeneous material in the endometrial cavity within the lower uterine segment is compatible with retained products  of conception. 2.  No gestational sac is present.   Original Report Authenticated By: Marin Roberts, M.D.   US Ob Transvaginal  01/13/2013   OBSTETRICAL ULTRASOUND: This exam was performed within a [facility]. The OB US report was generated in the [facility] system, and faxed to the ordering  physician.   This report is also available in [employer] and in the [employer]. See [facility] Obstetric US report.  US Transvaginal Non-ob  02/03/2013   *RADIOLOGY REPORT*  Clinical Data: Post D&C earlier in the day for retained products of conception.  Fever with elevated white count.  6 cm mass palpated in the cul-de-sac prior to D&C.  TRANSABDOMINAL AND TRANSVAGINAL ULTRASOUND OF PELVIS Technique:  Both transabdominal and transvaginal ultrasound examinations of the pelvis were performed. Transabdominal technique was performed for global imaging of the pelvis including uterus, ovaries, adnexal regions, and pelvic cul-de-sac.  It was necessary to proceed with endovaginal exam following the transabdominal exam to visualize the uterus and adnexa.  Comparison:  07/04, 07/11 and 01/25/2013  Findings:  Uterus: Is best visualized with a sagittal cine loop evaluation endovaginally and transabdominally but remains difficult to see on this exam.  The uterus is retroverted and retroflexed and demonstrates an axial position on endovaginal exam due to the findings in the cul-de-sac. A sagittal length of approximate 14 cm is noted.  A depth of 5.6 cm and width of 8.4 cm is identified. Full evaluation of the myometrium is difficult but no gross myometrial abnormalities are suggested  Endometrium:  There appears to be some hypoechoic material within the endometrial canal which demonstrates no intralesional flow and is most compatible with blood products within the canal post D&C.  Right ovary:  A normal appearing right ovary is not seen with confidence  Left ovary: A normal appearing left ovary is not seen with confidence  Other findings: There is a heterogeneous soft tissue mass with vascularity in the left adnexa measuring 8.0 x 4.6 cm by 3.4 cm. This extends towards the cul-de-sac which contains a complex fluid collection measuring 6.9 by 4.0 x 3.7 cm that contains layering complex fluid.  These findings are  new in comparison with the prior exams and given the history of fever and elevated white count are suspicious for a pelvic abscess.  IMPRESSION: Heterogeneous soft tissue mass with complex fluid in the pelvis as described above.  Given the clinical presentation, this finding is suspicious for pelvic abscess.  Further evaluation with CT with contrast is recommended for more global assessment of the pelvis.  Hypoechoic avascular material within the endometrial canal likely represents residual blood products post D&C.  This study was discussed in person with Dr. Debroah Loop.   Original Report Authenticated By: Rhodia Albright, M.D.   US Pelvis Complete  02/03/2013   *RADIOLOGY REPORT*  Clinical Data: Post D&C earlier in the day for retained products of conception.  Fever with elevated white count.  6 cm mass palpated in the cul-de-sac prior to D&C.  TRANSABDOMINAL AND TRANSVAGINAL ULTRASOUND OF PELVIS Technique:  Both transabdominal and transvaginal ultrasound examinations of the pelvis were performed. Transabdominal technique was performed for global imaging of the pelvis including uterus, ovaries, adnexal regions, and pelvic cul-de-sac.  It was necessary to proceed with endovaginal exam following the transabdominal exam to visualize the uterus and adnexa.  Comparison:  07/04, 07/11 and 01/25/2013  Findings:  Uterus: Is best visualized with a sagittal cine loop evaluation endovaginally and transabdominally but remains difficult to  see on this exam.  The uterus is retroverted and retroflexed and demonstrates an axial position on endovaginal exam due to the findings in the cul-de-sac. A sagittal length of approximate 14 cm is noted.  A depth of 5.6 cm and width of 8.4 cm is identified. Full evaluation of the myometrium is difficult but no gross myometrial abnormalities are suggested  Endometrium:  There appears to be some hypoechoic material within the endometrial canal which demonstrates no intralesional flow and is most  compatible with blood products within the canal post D&C.  Right ovary:  A normal appearing right ovary is not seen with confidence  Left ovary: A normal appearing left ovary is not seen with confidence  Other findings: There is a heterogeneous soft tissue mass with vascularity in the left adnexa measuring 8.0 x 4.6 cm by 3.4 cm. This extends towards the cul-de-sac which contains a complex fluid collection measuring 6.9 by 4.0 x 3.7 cm that contains layering complex fluid.  These findings are new in comparison with the prior exams and given the history of fever and elevated white count are suspicious for a pelvic abscess.  IMPRESSION: Heterogeneous soft tissue mass with complex fluid in the pelvis as described above.  Given the clinical presentation, this finding is suspicious for pelvic abscess.  Further evaluation with CT with contrast is recommended for more global assessment of the pelvis.  Hypoechoic avascular material within the endometrial canal likely represents residual blood products post D&C.  This study was discussed in person with Dr. Debroah Loop.   Original Report Authenticated By: Rhodia Albright, M.D.   Ct Abdomen Pelvis W Contrast  02/07/2013   *RADIOLOGY REPORT*  Clinical Data: Fever. Elevated white blood cell count.  Status post dilatation and curettage 02/03/2013.  CT ABDOMEN AND PELVIS WITH CONTRAST  Technique:  Multidetector CT imaging of the abdomen and pelvis was performed following the standard protocol during bolus administration of intravenous contrast.  Contrast: OMNIPAQUE IOHEXOL 300 MG/ML  SOLN  Comparison: CT abdomen and pelvis 02/03/2013.  Findings: Basilar airspace opacities, worse on the right, are again seen and appear slightly improved.  No pleural or pericardial effusion is identified.  The fluid collection in the left hemi pelvis seen on the prior study appears more organized on today's examination with rim enhancement present.  The collection measures 5.7 cm A P by 4.4 cm  transverse by 3.5 cm cranial-caudal. This collection may be within the ovary.  A second collection is seen in the cul-de-sac measuring 6.2 cm transverse by 4.4 cm AP by 3.1 cm cranial-caudal. There is some rim enhancement about this collection.  The liver, gallbladder, adrenal glands, spleen, pancreas and kidneys appear normal.  Urinary bladder and uterus are unremarkable.  Degenerative disease is seen about the right hip. No gross bony lesion.  IMPRESSION:  Rim enhancing fluid collections within or adjacent to the left ovary and in the cul-de-sac highly suspicious for abscesses.   Original Report Authenticated By: Holley Dexter, M.D.   Ct Abdomen Pelvis W Contrast  02/03/2013   *RADIOLOGY REPORT*  Clinical Data: D and C procedure earlier today.  Abnormal ultrasound examination earlier today questioning a pelvic abscess.  CT ABDOMEN AND PELVIS WITH CONTRAST  Technique:  Multidetector CT imaging of the abdomen and pelvis was performed following the standard protocol during bolus administration of intravenous contrast.  Contrast: OMNIPAQUE IOHEXOL 300 MG/ML IV. Oral contrast was also administered.  Comparison: Pelvic ultrasound earlier same date.  No prior CT.  Findings: Approximate  10.2 x 5.1 x 5.4 cm heterogeneous collection in the left side of the pelvis, with extension into the presacral space in the lower pelvis and extension upward into the retroperitoneum of the left upper pelvis overlying the left psoas muscle.  Small amount of simple free fluid in the cul-de-sac. Uterus displaced slightly rightward by the collection.  Small amount of endometrial fluid.  No free intraperitoneal air.  Upper normal-sized liver without focal parenchymal abnormality. Layering sludge in the gallbladder without visible gallstones.  No biliary ductal dilation.  Normal-appearing spleen, pancreas, adrenal glands, and kidneys.  Normal-appearing vascular structures. No significant lymphadenopathy.  Stomach normal in  appearance, filled with food.  Normal-appearing small bowel and colon.  Appendix not clearly visualized, but no pericecal inflammation.  Bone window images unremarkable.  Atelectasis in the visualized lower lobes.  Heart size normal.  IMPRESSION:  1. Hematoma (favored over an abscess) in the left side of the pelvis, with extension into the presacral space of the low pelvis and the retroperitoneum of the upper pelvis. 2.  Small amount of simple free fluid in the cul-de-sac. 3.  Layering sludge within the gallbladder.  No calcified gallstones. 4.  Atelectasis in the visualized lower lobes.   Original Report Authenticated By: Hulan Saas, M.D.  Results for orders placed during the hospital encounter of 02/03/13  CULTURE, BLOOD (SINGLE)      Result Value Range   Specimen Description BLOOD RIGHT ARM     Special Requests BOTTLES DRAWN AEROBIC AND ANAEROBIC 10CC     Culture  Setup Time 02/04/2013 07:57     Culture       Value:        BLOOD CULTURE RECEIVED NO GROWTH TO DATE CULTURE WILL BE HELD FOR 5 DAYS BEFORE ISSUING A FINAL NEGATIVE REPORT   Report Status PENDING    URINE CULTURE      Result Value Range   Specimen Description URINE, CLEAN CATCH     Special Requests NONE     Culture  Setup Time 02/03/2013 15:10     Colony Count >=100,000 COLONIES/ML     Culture STREPTOCOCCUS GROUP D;high probability for S.bovis     Report Status 02/05/2013 FINAL     Organism ID, Bacteria STREPTOCOCCUS GROUP D;high probability for S.bovis    CULTURE, BLOOD (ROUTINE X 2)      Result Value Range   Specimen Description BLOOD LEFT ARM     Special Requests       Value: BOTTLES DRAWN AEROBIC AND ANAEROBIC 10CC BOTH BOTTLES   Culture  Setup Time       Value: 02/06/2013 22:11     Performed at [facility]   Culture       Value:        BLOOD CULTURE RECEIVED NO GROWTH TO DATE CULTURE WILL BE HELD FOR 5 DAYS BEFORE ISSUING A FINAL NEGATIVE REPORT     Performed at [facility]   Report Status  PENDING    CULTURE, BLOOD (ROUTINE X 2)      Result Value Range   Specimen Description BLOOD LEFT ARM     Special Requests       Value: BOTTLES DRAWN AEROBIC AND ANAEROBIC 10CC BOTH BOTTLES   Culture  Setup Time       Value: 02/06/2013 22:11     Performed at [facility]   Culture       Value:        BLOOD CULTURE RECEIVED NO GROWTH  TO DATE CULTURE WILL BE HELD FOR 5 DAYS BEFORE ISSUING A FINAL NEGATIVE REPORT     Performed at [facility]   Report Status PENDING    CBC      Result Value Range   WBC 37.4 (*) 4.0 - 10.5 K/uL   RBC 3.29 (*) 3.87 - 5.11 MIL/uL   Hemoglobin 9.9 (*) 12.0 - 15.0 g/dL   HCT 16.1 (*) 09.6 - 04.5 %   MCV 87.2  78.0 - 100.0 fL   MCH 30.1  26.0 - 34.0 pg   MCHC 34.5  30.0 - 36.0 g/dL   RDW 40.9  81.1 - 91.4 %   Platelets 397  150 - 400 K/uL  URINALYSIS, ROUTINE W REFLEX MICROSCOPIC      Result Value Range   Color, Urine RED (*) YELLOW   APPearance CLOUDY (*) CLEAR   Specific Gravity, Urine 1.025  1.005 - 1.030   pH 5.5  5.0 - 8.0   Glucose, UA 100 (*) NEGATIVE mg/dL   Hgb urine dipstick LARGE (*) NEGATIVE   Bilirubin Urine MODERATE (*) NEGATIVE   Ketones, ur 15 (*) NEGATIVE mg/dL   Protein, ur >782 (*) NEGATIVE mg/dL   Urobilinogen, UA 4.0 (*) 0.0 - 1.0 mg/dL   Nitrite POSITIVE (*) NEGATIVE   Leukocytes, UA MODERATE (*) NEGATIVE  URINE MICROSCOPIC-ADD ON      Result Value Range   Squamous Epithelial / LPF FEW (*) RARE   WBC, UA 21-50  <3 WBC/hpf   RBC / HPF 21-50  <3 RBC/hpf   Bacteria, UA MANY (*) RARE   Urine-Other TRICHOMONAS PRESENT    COMPREHENSIVE METABOLIC PANEL      Result Value Range   Sodium 135  135 - 145 mEq/L   Potassium 3.5  3.5 - 5.1 mEq/L   Chloride 100  96 - 112 mEq/L   CO2 21  19 - 32 mEq/L   Glucose, Bld 112 (*) 70 - 99 mg/dL   BUN 19  6 - 23 mg/dL   Creatinine, Ser 9.56  0.50 - 1.10 mg/dL   Calcium 9.0  8.4 - 21.3 mg/dL   Total Protein 6.1  6.0 - 8.3 g/dL   Albumin 2.5 (*) 3.5 - 5.2 g/dL   AST 18  0 -  37 U/L   ALT 25  0 - 35 U/L   Alkaline Phosphatase 98  39 - 117 U/L   Total Bilirubin 0.5  0.3 - 1.2 mg/dL   GFR calc non Af Amer 81 (*) >90 mL/min   GFR calc Af Amer >90  >90 mL/min  CBC      Result Value Range   WBC 28.1 (*) 4.0 - 10.5 K/uL   RBC 2.67 (*) 3.87 - 5.11 MIL/uL   Hemoglobin 8.1 (*) 12.0 - 15.0 g/dL   HCT 08.6 (*) 57.8 - 46.9 %   MCV 88.4  78.0 - 100.0 fL   MCH 30.3  26.0 - 34.0 pg   MCHC 34.3  30.0 - 36.0 g/dL   RDW 62.9  52.8 - 41.3 %   Platelets 348  150 - 400 K/uL  HCG, QUANTITATIVE, PREGNANCY      Result Value Range   hCG, Beta Chain, Quant, S 64 (*) <5 mIU/mL  CBC      Result Value Range   WBC 22.9 (*) 4.0 - 10.5 K/uL   RBC 2.64 (*) 3.87 - 5.11 MIL/uL   Hemoglobin 7.8 (*) 12.0 - 15.0 g/dL   HCT  22.9 (*) 36.0 - 46.0 %   MCV 86.7  78.0 - 100.0 fL   MCH 29.5  26.0 - 34.0 pg   MCHC 34.1  30.0 - 36.0 g/dL   RDW 16.1  09.6 - 04.5 %   Platelets 352  150 - 400 K/uL  CBC      Result Value Range   WBC 19.5 (*) 4.0 - 10.5 K/uL   RBC 2.47 (*) 3.87 - 5.11 MIL/uL   Hemoglobin 7.4 (*) 12.0 - 15.0 g/dL   HCT 40.9 (*) 81.1 - 91.4 %   MCV 87.0  78.0 - 100.0 fL   MCH 30.0  26.0 - 34.0 pg   MCHC 34.4  30.0 - 36.0 g/dL   RDW 78.2 (*) 95.6 - 21.3 %   Platelets 398  150 - 400 K/uL  CBC      Result Value Range   WBC 21.0 (*) 4.0 - 10.5 K/uL   RBC 2.58 (*) 3.87 - 5.11 MIL/uL   Hemoglobin 7.6 (*) 12.0 - 15.0 g/dL   HCT 08.6 (*) 57.8 - 46.9 %   MCV 86.8  78.0 - 100.0 fL   MCH 29.5  26.0 - 34.0 pg   MCHC 33.9  30.0 - 36.0 g/dL   RDW 62.9  52.8 - 41.3 %   Platelets 418 (*) 150 - 400 K/uL  PROTIME-INR      Result Value Range   Prothrombin Time 15.1  11.6 - 15.2 seconds   INR 1.22  0.00 - 1.49  APTT      Result Value Range   aPTT 39 (*) 24 - 37 seconds  PREPARE RBC (CROSSMATCH)      Result Value Range   Order Confirmation ORDER PROCESSED BY BLOOD BANK    TYPE AND SCREEN      Result Value Range   ABO/RH(D) O POS     Antibody Screen NEG     Sample Expiration  02/11/2013     Unit Number [id number]     Blood Component Type RBC CPDA1, LR     Unit division 00     Status of Unit ALLOCATED     Transfusion Status OK TO TRANSFUSE     Crossmatch Result Compatible     Unit Number [id number]     Blood Component Type RED CELLS,LR     Unit division 00     Status of Unit ALLOCATED     Transfusion Status OK TO TRANSFUSE     Crossmatch Result Compatible     A/P: Pt s/p recent D&E for missed abortion; now with persistent leukocytosis, temp elevations, findings of pelvic fluid collections susp for abscesses on CT. Plan is for CT guided drainage of pelvic fluid collections today. Details /risks of procedure d/w pt with her understanding and consent.

## 2013-02-08 NOTE — Progress Notes (Signed)
Pt afebrile since 2030... Updated Dr. Despina Hidden... Stated to go ahead and begin blood transfusion.

## 2013-02-09 MED ORDER — ACETAMINOPHEN 500 MG PO TABS
1000.0000 mg | ORAL_TABLET | Freq: Once | ORAL | Status: AC
  Administered 2013-02-09: 1000 mg via ORAL

## 2013-02-09 NOTE — Progress Notes (Signed)
Pt spiked temp of 103.3 at 0210 despite having 1 gram tylenol at 0054... Dr. Despina Hidden notified... Stated to hold 2nd unit of blood until temperature resolves. Will continue to monitor pt.Marland Kitchen

## 2013-02-09 NOTE — Progress Notes (Signed)
Subjective: Patient reports tolerating PO.  Sore at puncture sites  Objective: I have reviewed patient's vital signs, intake and output and medications. Filed Vitals:   02/09/13 0440 02/09/13 0540 02/09/13 0640 02/09/13 0915  BP: 112/63 118/75 100/52 130/72  Pulse: 103 98 102 105  Temp: 99.1 F (37.3 C) 99.5 F (37.5 C) 100.2 F (37.9 C) 99.1 F (37.3 C)  TempSrc: Oral Oral Oral Oral  Resp: 18 18 18 17   Height:      Weight:      SpO2: 94% 98% 100% 91%    General: alert, cooperative and no distress GI: soft, non-tender; bowel sounds normal; no masses,  no organomegaly Drain llq and right buttock 167 ml drainage 24 hr, purulent Assessment/Plan: POD 1 drainage abscesses   LOS: 6 days    ARNOLD,Hommel 02/09/2013, 11:31 AM

## 2013-02-09 NOTE — Progress Notes (Signed)
Pt spiked a temp of 101.8 at 0044 during blood transfusion... Dr. Despina Hidden notified and stated to give 1,000 mg of tylenol now and continue with blood transfusion... Will continue to monitor pt.

## 2013-02-10 LAB — CBC
MCV: 86.8 fL (ref 78.0–100.0)
Platelets: 367 10*3/uL (ref 150–400)
RBC: 2.88 MIL/uL — ABNORMAL LOW (ref 3.87–5.11)
WBC: 21.9 10*3/uL — ABNORMAL HIGH (ref 4.0–10.5)

## 2013-02-10 LAB — TYPE AND SCREEN
Antibody Screen: NEGATIVE
Unit division: 0

## 2013-02-10 LAB — CULTURE, BLOOD (SINGLE)

## 2013-02-10 MED ORDER — SIMETHICONE 80 MG PO CHEW
80.0000 mg | CHEWABLE_TABLET | Freq: Four times a day (QID) | ORAL | Status: DC
  Administered 2013-02-10 – 2013-02-13 (×10): 80 mg via ORAL

## 2013-02-10 MED ORDER — HYDROMORPHONE HCL 2 MG PO TABS
2.0000 mg | ORAL_TABLET | ORAL | Status: DC | PRN
  Administered 2013-02-10 – 2013-02-12 (×6): 2 mg via ORAL
  Filled 2013-02-10 (×6): qty 1

## 2013-02-10 NOTE — Progress Notes (Signed)
Subjective: Patient reports incisional pain.  Where drains are.  She reports decreasing pain in abdomen.  Objective: I have reviewed patient's vital signs, intake and output, medications, labs and microbiology.  Tmax-102.9 12:44 pm 8/7 Drains 17 cc in last 24 hours  General: alert, cooperative and appears stated age GI: abnormal findings:  moderate tenderness in the lower abdomen Extremities: extremities normal, atraumatic, no cyanosis or edema   Assessment/Plan: Bilateral TOA's.  S/p IR drainage and 2 u PRBC's. Slow improvement.  Remains febrile. Continue Abx. Switch to po pain meds.   LOS: 7 days    Ginnette Gates S 02/10/2013, 7:45 AM

## 2013-02-11 NOTE — Progress Notes (Signed)
Subjective: Patient reports tolerating PO and no problems voiding.  Pain improved. No N/V   Objective: I have reviewed patient's vital signs, intake and output, medications, labs, microbiology and radiology results.  General: alert and no distress Resp: clear to auscultation bilaterally Cardio: regular rate and rhythm, S1, S2 normal, no murmur, click, rub or gallop GI: soft, non-tender; bowel sounds normal; no masses,  no organomegaly and ant perc drain with no complications Perc drain in right buttock- no complications   Assessment/Plan: Pt afebrile x 48hours.  Minimal drainage from perc tubes bilaterally. WBC still 20K yesterday Repeat CBC in am BMP in am to check  Cr- Repeat CT in am    LOS: 8 days    Erica [name], Erica [name] 02/11/2013, 10:17 AM

## 2013-02-11 NOTE — Progress Notes (Signed)
Subjective: Report per RN. Still some pains at drains. Fevers gone.  Objective: Physical Exam: BP 122/80  Pulse 65  Temp(Src) 97.8 F (36.6 C) (Oral)  Resp 18  Ht 5\' 1"  (1.549 m)  Wt 183 lb (83.008 kg)  BMI 34.6 kg/m2  SpO2 99%  LMP 02/04/2013 Afebrile Drains intact. Output about 25cc each.    Labs: CBC  Recent Labs  02/10/13 1026  WBC 21.9*  HGB 8.6*  HCT 25.0*  PLT 367    Studies/Results: No results found.  Assessment/Plan: S/p pelvic drains for TOA/pelvic abscesses Fevers improving. WBC remains elevated, not checked today Cont drain flushes.   LOS: 8 days    Brayton El PA-C 02/11/2013 8:24 AM

## 2013-02-12 ENCOUNTER — Inpatient Hospital Stay (HOSPITAL_COMMUNITY): Payer: Medicaid Other

## 2013-02-12 ENCOUNTER — Encounter (HOSPITAL_COMMUNITY): Payer: Self-pay | Admitting: Radiology

## 2013-02-12 LAB — CULTURE, BLOOD (ROUTINE X 2): Culture: NO GROWTH

## 2013-02-12 LAB — CBC WITH DIFFERENTIAL/PLATELET
Band Neutrophils: 1 % (ref 0–10)
Basophils Absolute: 0 10*3/uL (ref 0.0–0.1)
Basophils Relative: 0 % (ref 0–1)
Eosinophils Absolute: 0 10*3/uL (ref 0.0–0.7)
HCT: 24 % — ABNORMAL LOW (ref 36.0–46.0)
Hemoglobin: 8 g/dL — ABNORMAL LOW (ref 12.0–15.0)
Lymphocytes Relative: 20 % (ref 12–46)
Lymphs Abs: 2.8 10*3/uL (ref 0.7–4.0)
MCHC: 33.3 g/dL (ref 30.0–36.0)
Monocytes Absolute: 0.4 10*3/uL (ref 0.1–1.0)
Monocytes Relative: 3 % (ref 3–12)
WBC: 14 10*3/uL — ABNORMAL HIGH (ref 4.0–10.5)

## 2013-02-12 LAB — BASIC METABOLIC PANEL
Calcium: 8.6 mg/dL (ref 8.4–10.5)
Creatinine, Ser: 0.73 mg/dL (ref 0.50–1.10)
GFR calc Af Amer: >90 mL/min (ref >90)

## 2013-02-12 MED ORDER — IOHEXOL 300 MG/ML  SOLN: 50.0000 mL | INTRAMUSCULAR | Status: AC

## 2013-02-12 MED ORDER — IOHEXOL 300 MG/ML  SOLN
100.0000 mL | Freq: Once | INTRAMUSCULAR | Status: AC | PRN
  Administered 2013-02-12: 100 mL via INTRAVENOUS

## 2013-02-12 MED ORDER — HYDROMORPHONE HCL 2 MG PO TABS
2.0000 mg | ORAL_TABLET | ORAL | Status: DC | PRN
  Administered 2013-02-12 – 2013-02-13 (×4): 4 mg via ORAL
  Filled 2013-02-12 (×4): qty 2

## 2013-02-12 MED ORDER — IOHEXOL 300 MG/ML  SOLN
300.0000 mL | Freq: Once | INTRAMUSCULAR | Status: AC | PRN
  Administered 2013-02-12: 300 mL via ORAL

## 2013-02-12 NOTE — Progress Notes (Signed)
Subjective: Patient reports tolerating PO, + flatus, + BM and no problems voiding.  Pain markedly improved since admission. No fever or chills overnight.  Objective: I have reviewed patient's vital signs, intake and output, medications, labs and microbiology.  General: alert and no distress GI: soft, non-tender; bowel sounds normal; no masses,  no organomegaly and perc drains sites without complications CBC    Component Value Date/Time   WBC 14.0* 02/12/2013 0505   RBC 2.72* 02/12/2013 0505   HGB 8.0* 02/12/2013 0505   HCT 24.0* 02/12/2013 0505   PLT 397 02/12/2013 0505   MCV 88.2 02/12/2013 0505   MCH 29.4 02/12/2013 0505   MCHC 33.3 02/12/2013 0505   RDW 15.5 02/12/2013 0505   LYMPHSABS 2.8 02/12/2013 0505   MONOABS 0.4 02/12/2013 0505   EOSABS 0.0 02/12/2013 0505   BASOSABS 0.0 02/12/2013 0505   Abscess cx- no growth to date   Assessment/Plan: Bilateral TOA's with perc drains x 4 days.  Clinically improving. Afebrile >48hours CT scan today Consider discharge later today on po atbx   LOS: 9 days    HARRAWAY-SMITH, Jayma Volpi 02/12/2013, 9:11 AM

## 2013-02-13 DIAGNOSIS — O035 Genital tract and pelvic infection following complete or unspecified spontaneous abortion: Secondary | ICD-10-CM

## 2013-02-13 DIAGNOSIS — A5909 Other urogenital trichomoniasis: Secondary | ICD-10-CM

## 2013-02-13 DIAGNOSIS — O034 Incomplete spontaneous abortion without complication: Secondary | ICD-10-CM

## 2013-02-13 DIAGNOSIS — N731 Chronic parametritis and pelvic cellulitis: Secondary | ICD-10-CM

## 2013-02-13 DIAGNOSIS — N390 Urinary tract infection, site not specified: Secondary | ICD-10-CM

## 2013-02-13 MED ORDER — DOXYCYCLINE HYCLATE 100 MG PO CAPS: 100.0000 mg | ORAL_CAPSULE | Freq: Two times a day (BID) | ORAL | Status: DC

## 2013-02-13 MED ORDER — OXYCODONE-ACETAMINOPHEN 5-325 MG PO TABS: 1.0000 | ORAL_TABLET | Freq: Three times a day (TID) | ORAL | Status: DC | PRN

## 2013-02-13 MED ORDER — IBUPROFEN 800 MG PO TABS: 800.0000 mg | ORAL_TABLET | Freq: Three times a day (TID) | ORAL | Status: DC | PRN

## 2013-02-13 MED ORDER — METRONIDAZOLE 500 MG PO TABS: 500.0000 mg | ORAL_TABLET | Freq: Three times a day (TID) | ORAL | Status: DC

## 2013-02-13 NOTE — Discharge Instructions (Signed)
Pelvic Inflammatory Disease °Pelvic inflammatory disease (PID) refers to an infection in some or all of the female organs. The infection can be in the uterus, ovaries, fallopian tubes, or the surrounding tissues in the pelvis. PID can cause abdominal or pelvic pain that comes on suddenly (acute pelvic pain). PID is a serious infection because it can lead to lasting (chronic) pelvic pain or the inability to have children (infertile).  °CAUSES  °The infection is often caused by the normal bacteria found in the vaginal tissues. PID may also be caused by an infection that is spread during sexual contact. PID can also occur following:  °· The birth of a baby.   °· A miscarriage.   °· An abortion.   °· Major pelvic surgery.   °· The use of an intrauterine device (IUD).   °· A sexual assault.   °RISK FACTORS °Certain factors can put a person at higher risk for PID, such as: °· Being younger than 25 years. °· Being sexually active at a young age. °· Using nonbarrier contraception. °· Having multiple sexual partners. °· Having sex with someone who has symptoms of a genital infection. °· Using oral contraception. °Other times, certain behaviors can increase the possibility of getting PID, such as: °· Having sex during your period. °· Using a vaginal douche. °· Having an intrauterine device (IUD) in place. °SYMPTOMS  °· Abdominal or pelvic pain.   °· Fever.   °· Chills.   °· Abnormal vaginal discharge. °· Abnormal uterine bleeding.   °· Unusual pain shortly after finishing your period. °DIAGNOSIS  °Your caregiver will choose some of the following methods to make a diagnosis, such as:  °· Performing a physical exam and history. A pelvic exam typically reveals a very tender uterus and surrounding pelvis.   °· Ordering laboratory tests including a pregnancy test, blood tests, and urine test.  °· Ordering cultures of the vagina and cervix to check for a sexually transmitted infection (STI). °· Performing an ultrasound.    °· Performing a laparoscopic procedure to look inside the pelvis.   °TREATMENT  °· Antibiotic medicines may be prescribed and taken by mouth.   °· Sexual partners may be treated when the infection is caused by a sexually transmitted disease (STD).   °· Hospitalization may be needed to give antibiotics intravenously. °· Surgery may be needed, but this is rare. °It may take weeks until you are completely well. If you are diagnosed with PID, you should also be checked for human immunodeficiency virus (HIV).   °HOME CARE INSTRUCTIONS  °· If given, take your antibiotics as directed. Finish the medicine even if you start to feel better.   °· Only take over-the-counter or prescription medicines for pain, discomfort, or fever as directed by your caregiver.   °· Do not have sexual intercourse until treatment is completed or as directed by your caregiver. If PID is confirmed, your recent sexual partner(s) will need treatment.   °· Keep your follow-up appointments. °SEEK MEDICAL CARE IF:  °· You have increased or abnormal vaginal discharge.   °· You need prescription medicine for your pain.   °· You vomit.   °· You cannot take your medicines.   °· Your partner has an STD.   °SEEK IMMEDIATE MEDICAL CARE IF:  °· You have a fever.   °· You have increased abdominal or pelvic pain.   °· You have chills.   °· You have pain when you urinate.   °· You are not better after 72 hours following treatment.   °MAKE SURE YOU:  °· Understand these instructions. °· Will watch your condition. °· Will get help right away if you are not doing well or get worse. °  pelvic pain.    You have chills.    You have pain when you urinate.    You are not better after 72 hours following treatment.   MAKE SURE YOU:    Understand these instructions.   Will watch your condition.   Will get help right away if you are not doing well or get worse.  Document Released: 06/22/2005 Document Revised: 03/16/2012 Document Reviewed: 06/18/2011  [employer] Patient Information 2014 [employer]

## 2013-02-13 NOTE — Discharge Summary (Signed)
Physician Discharge Summary  Patient ID: Erica [name] MRN: [id number] DOB/AGE: Jun 23, 1976 37 y.o.  Admit date: 02/03/2013 Discharge date: 02/13/2013  Admission Diagnoses:    Pelvic abscess/Tuboovarian abscess in female   Trichomonal cystitis   UTI (urinary tract infection)   Spontaneous abortion/retained products of conception (RPOC)/spetic abortion  Consults: Interventional Radiology  Procedures:  Suction dilation and curettage on 02/03/13 Drainage of pelvic abscess by Interventional Radiology on 02/08/13 Removal of IR drains prior to discharge on 02/13/13  Discharged Condition: Stable  Hospital Course:  Patient is a 37 y.o. G3P3 who was admitted on 02/03/13 for suction dilation and curettage in the setting of retained products of conception (RPOC) after failed misoprostol administration. She presented with fevers and back pain, and this persisted after the [facility] which was concerning for septic abortion.  However, ultrasound and subsequent CT scan revealed a large pelvic abscess.  Urine culture also grew out >100K colonies of Streptococcus Group D likely S. Bovis, and she was also noted to have trichomonal cystitis.  She was treated with IV Zosyn and oral Doxycycline; she also received Metronidazole 2000 mg po x 1 for the trichomonal infection.  She continued to have fevers during her admission, repeat imaging showed persistent abscess, so she underwent CT guided drainage of the abscess by IR on 02/08/13.  She tolerated this procedure well and had two drains placed via right transgluteal approach and left lower quadrant approach.  Patient's last fever was on 02/09/13. Her drain output was monitored and was consistently < 20 ml on 02/11/13 and 02/12/13, and her pain was significantly improved.  On 02/13/13, she was switched to oral Metronidazole and Doxycyline for ten additional days , and her IR drains were removed. She was then deemed stable for discharge to home with outpatient followup.  Significant  Diagnostic Studies: Results for orders placed during the hospital encounter of 02/03/13 (from the past 336 hour(s))  CBC   Collection Time    02/03/13 10:11 AM      Result Value Range   WBC 37.4 (*) 4.0 - 10.5 K/uL   RBC 3.29 (*) 3.87 - 5.11 MIL/uL   Hemoglobin 9.9 (*) 12.0 - 15.0 g/dL   HCT 14.7 (*) 82.9 - 56.2 %   MCV 87.2  78.0 - 100.0 fL   MCH 30.1  26.0 - 34.0 pg   MCHC 34.5  30.0 - 36.0 g/dL   RDW 13.0  86.5 - 78.4 %   Platelets 397  150 - 400 K/uL  CULTURE, BLOOD (SINGLE)   Collection Time    02/03/13 10:35 AM      Result Value Range   Specimen Description BLOOD RIGHT ARM     Special Requests BOTTLES DRAWN AEROBIC AND ANAEROBIC 10CC     Culture  Setup Time       Value: 02/04/2013 07:57     Performed at [facility]   Culture       Value: NO GROWTH 5 DAYS     Performed at [facility]   Report Status 02/10/2013 FINAL    URINE CULTURE   Collection Time    02/03/13 10:40 AM      Result Value Range   Specimen Description URINE, CLEAN CATCH     Special Requests NONE     Culture  Setup Time 02/03/2013 15:10     Colony Count >=100,000 COLONIES/ML     Culture STREPTOCOCCUS GROUP D;high probability for S.bovis     Report Status 02/05/2013 FINAL  Organism ID, Bacteria STREPTOCOCCUS GROUP D;high probability for S.bovis    URINALYSIS, ROUTINE W REFLEX MICROSCOPIC   Collection Time    02/03/13 10:40 AM      Result Value Range   Color, Urine RED (*) YELLOW   APPearance CLOUDY (*) CLEAR   Specific Gravity, Urine 1.025  1.005 - 1.030   pH 5.5  5.0 - 8.0   Glucose, UA 100 (*) NEGATIVE mg/dL   Hgb urine dipstick LARGE (*) NEGATIVE   Bilirubin Urine MODERATE (*) NEGATIVE   Ketones, ur 15 (*) NEGATIVE mg/dL   Protein, ur >161 (*) NEGATIVE mg/dL   Urobilinogen, UA 4.0 (*) 0.0 - 1.0 mg/dL   Nitrite POSITIVE (*) NEGATIVE   Leukocytes, UA MODERATE (*) NEGATIVE  URINE MICROSCOPIC-ADD ON   Collection Time    02/03/13 10:40 AM      Result Value Range    Squamous Epithelial / LPF FEW (*) RARE   WBC, UA 21-50  <3 WBC/hpf   RBC / HPF 21-50  <3 RBC/hpf   Bacteria, UA MANY (*) RARE   Urine-Other TRICHOMONAS PRESENT    COMPREHENSIVE METABOLIC PANEL   Collection Time    02/03/13  1:04 PM      Result Value Range   Sodium 135  135 - 145 mEq/L   Potassium 3.5  3.5 - 5.1 mEq/L   Chloride 100  96 - 112 mEq/L   CO2 21  19 - 32 mEq/L   Glucose, Bld 112 (*) 70 - 99 mg/dL   BUN 19  6 - 23 mg/dL   Creatinine, Ser 0.96  0.50 - 1.10 mg/dL   Calcium 9.0  8.4 - 04.5 mg/dL   Total Protein 6.1  6.0 - 8.3 g/dL   Albumin 2.5 (*) 3.5 - 5.2 g/dL   AST 18  0 - 37 U/L   ALT 25  0 - 35 U/L   Alkaline Phosphatase 98  39 - 117 U/L   Total Bilirubin 0.5  0.3 - 1.2 mg/dL   GFR calc non Af Amer 81 (*) >90 mL/min   GFR calc Af Amer >90  >90 mL/min  CBC   Collection Time    02/04/13  5:45 AM      Result Value Range   WBC 28.1 (*) 4.0 - 10.5 K/uL   RBC 2.67 (*) 3.87 - 5.11 MIL/uL   Hemoglobin 8.1 (*) 12.0 - 15.0 g/dL   HCT 40.9 (*) 81.1 - 91.4 %   MCV 88.4  78.0 - 100.0 fL   MCH 30.3  26.0 - 34.0 pg   MCHC 34.3  30.0 - 36.0 g/dL   RDW 78.2  95.6 - 21.3 %   Platelets 348  150 - 400 K/uL  HCG, QUANTITATIVE, PREGNANCY   Collection Time    02/04/13  5:45 AM      Result Value Range   hCG, Beta Chain, Quant, S 64 (*) <5 mIU/mL  CBC   Collection Time    02/05/13  7:30 AM      Result Value Range   WBC 22.9 (*) 4.0 - 10.5 K/uL   RBC 2.64 (*) 3.87 - 5.11 MIL/uL   Hemoglobin 7.8 (*) 12.0 - 15.0 g/dL   HCT 08.6 (*) 57.8 - 46.9 %   MCV 86.7  78.0 - 100.0 fL   MCH 29.5  26.0 - 34.0 pg   MCHC 34.1  30.0 - 36.0 g/dL   RDW 62.9  52.8 - 41.3 %   Platelets 352  150 - 400 K/uL  CULTURE, BLOOD (ROUTINE X 2)   Collection Time    02/06/13  4:10 PM      Result Value Range   Specimen Description BLOOD LEFT ARM     Special Requests       Value: BOTTLES DRAWN AEROBIC AND ANAEROBIC 10CC BOTH BOTTLES   Culture  Setup Time       Value: 02/06/2013 22:11     Performed  at [facility]   Culture       Value: NO GROWTH 5 DAYS     Performed at [facility]   Report Status 02/12/2013 FINAL    CULTURE, BLOOD (ROUTINE X 2)   Collection Time    02/06/13  4:15 PM      Result Value Range   Specimen Description BLOOD LEFT ARM     Special Requests       Value: BOTTLES DRAWN AEROBIC AND ANAEROBIC 10CC BOTH BOTTLES   Culture  Setup Time       Value: 02/06/2013 22:11     Performed at [facility]   Culture       Value: NO GROWTH 5 DAYS     Performed at [facility]   Report Status 02/12/2013 FINAL    CBC   Collection Time    02/07/13  5:25 AM      Result Value Range   WBC 19.5 (*) 4.0 - 10.5 K/uL   RBC 2.47 (*) 3.87 - 5.11 MIL/uL   Hemoglobin 7.4 (*) 12.0 - 15.0 g/dL   HCT 16.1 (*) 09.6 - 04.5 %   MCV 87.0  78.0 - 100.0 fL   MCH 30.0  26.0 - 34.0 pg   MCHC 34.4  30.0 - 36.0 g/dL   RDW 40.9 (*) 81.1 - 91.4 %   Platelets 398  150 - 400 K/uL  PREPARE RBC (CROSSMATCH)   Collection Time    02/08/13  7:00 AM      Result Value Range   Order Confirmation ORDER PROCESSED BY BLOOD BANK    CBC   Collection Time    02/08/13  7:25 AM      Result Value Range   WBC 21.0 (*) 4.0 - 10.5 K/uL   RBC 2.58 (*) 3.87 - 5.11 MIL/uL   Hemoglobin 7.6 (*) 12.0 - 15.0 g/dL   HCT 78.2 (*) 95.6 - 21.3 %   MCV 86.8  78.0 - 100.0 fL   MCH 29.5  26.0 - 34.0 pg   MCHC 33.9  30.0 - 36.0 g/dL   RDW 08.6  57.8 - 46.9 %   Platelets 418 (*) 150 - 400 K/uL  PROTIME-INR   Collection Time    02/08/13  7:25 AM      Result Value Range   Prothrombin Time 15.1  11.6 - 15.2 seconds   INR 1.22  0.00 - 1.49  APTT   Collection Time    02/08/13  7:25 AM      Result Value Range   aPTT 39 (*) 24 - 37 seconds  TYPE AND SCREEN   Collection Time    02/08/13  7:25 AM      Result Value Range   ABO/RH(D) O POS     Antibody Screen NEG     Sample Expiration 02/11/2013     Unit Number [id number]     Blood Component Type RBC CPDA1, LR     Unit division  00     Status  of Unit ISSUED,FINAL     Transfusion Status OK TO TRANSFUSE     Crossmatch Result Compatible     Unit Number [id number]     Blood Component Type RED CELLS,LR     Unit division 00     Status of Unit ISSUED,FINAL     Transfusion Status OK TO TRANSFUSE     Crossmatch Result Compatible    CULTURE, ROUTINE-ABSCESS   Collection Time    02/08/13  4:36 PM      Result Value Range   Specimen Description       Value: ABSCESS PELVIS RIGHT     Performed at [facility]   Special Requests       Value: SAMPLE NO 1     Performed at [facility]   Gram Stain       Value: ABUNDANT WBC PRESENT,BOTH PMN AND MONONUCLEAR     NO SQUAMOUS EPITHELIAL CELLS SEEN     NO ORGANISMS SEEN     Performed at [facility]   Culture       Value: NO GROWTH 3 DAYS     Performed at [facility]   Report Status PENDING    CULTURE, ROUTINE-ABSCESS   Collection Time    02/08/13  4:36 PM      Result Value Range   Specimen Description       Value: ABSCESS PELVIS LEFT     Performed at [facility]   Special Requests       Value: SAMPLE NO 2     Performed at [facility]   Gram Stain       Value: ABUNDANT WBC PRESENT, PREDOMINANTLY PMN     NO SQUAMOUS EPITHELIAL CELLS SEEN     ABUNDANT GRAM POSITIVE COCCI     IN PAIRS FEW GRAM NEGATIVE RODS     Performed at [facility]   Culture       Value: Culture reincubated for better growth     Performed at [facility]   Report Status PENDING    CBC   Collection Time    02/10/13 10:26 AM      Result Value Range   WBC 21.9 (*) 4.0 - 10.5 K/uL   RBC 2.88 (*) 3.87 - 5.11 MIL/uL   Hemoglobin 8.6 (*) 12.0 - 15.0 g/dL   HCT 11.9 (*) 14.7 - 82.9 %   MCV 86.8  78.0 - 100.0 fL   MCH 29.9  26.0 - 34.0 pg   MCHC 34.4  30.0 - 36.0 g/dL   RDW 56.2  13.0 - 86.5 %   Platelets 367  150 - 400 K/uL  CBC WITH DIFFERENTIAL   Collection Time    02/12/13  5:05 AM      Result Value Range   WBC 14.0 (*)  4.0 - 10.5 K/uL   RBC 2.72 (*) 3.87 - 5.11 MIL/uL   Hemoglobin 8.0 (*) 12.0 - 15.0 g/dL   HCT 78.4 (*) 69.6 - 29.5 %   MCV 88.2  78.0 - 100.0 fL   MCH 29.4  26.0 - 34.0 pg   MCHC 33.3  30.0 - 36.0 g/dL   RDW 28.4  13.2 - 44.0 %   Platelets 397  150 - 400 K/uL   Neutrophils Relative % 74  43 - 77 %   Lymphocytes Relative 20  12 - 46 %   Monocytes Relative 3  3 - 12 %   Eosinophils Relative  0  0 - 5 %   Basophils Relative 0  0 - 1 %   Band Neutrophils 1  0 - 10 %   Metamyelocytes Relative 2     Myelocytes 0     Promyelocytes Absolute 0     Blasts 0     nRBC 0  0 /100 WBC   Neutro Abs 10.8 (*) 1.7 - 7.7 K/uL   Lymphs Abs 2.8  0.7 - 4.0 K/uL   Monocytes Absolute 0.4  0.1 - 1.0 K/uL   Eosinophils Absolute 0.0  0.0 - 0.7 K/uL   Basophils Absolute 0.0  0.0 - 0.1 K/uL   WBC Morphology TOXIC GRANULATION    BASIC METABOLIC PANEL   Collection Time    02/12/13  5:05 AM      Result Value Range   Sodium 136  135 - 145 mEq/L   Potassium 3.7  3.5 - 5.1 mEq/L   Chloride 103  96 - 112 mEq/L   CO2 23  19 - 32 mEq/L   Glucose, Bld 102 (*) 70 - 99 mg/dL   BUN 8  6 - 23 mg/dL   Creatinine, Ser 7.82  0.50 - 1.10 mg/dL   Calcium 8.6  8.4 - 95.6 mg/dL   GFR calc non Af Amer >90  >90 mL/min   GFR calc Af Amer >90  >90 mL/min   Dg Chest 2 View 02/03/2013   *RADIOLOGY REPORT*  Clinical Data: Smoker.  Chest pain.  CHEST - 2 VIEW  Comparison: None.  Findings: No cardiomegaly when accounting for low lung volumes. The upper mediastinal contours are unremarkable.  Low volume lungs with streaky lower opacities.  Negative for effusion or pneumothorax.  No acute osseous findings.  IMPRESSION:  Low lung volumes with bibasilar atelectasis.   Original Report Authenticated By: Tiburcio Pea   Dg Chest 2 View 02/04/2013   *RADIOLOGY REPORT*  Clinical Data: Spontaneous abortion with D&C yesterday.  The patient now has chest pain and shortness of breath.  CHEST - 2 VIEW  Comparison: Two-view chest x-ray yesterday.   Findings: Cardiac silhouette upper normal in size to slightly enlarged.  Hilar and mediastinal contours unremarkable.  Interval development of a focal airspace opacity in the right lower lobe superimposed upon the linear atelectasis identified yesterday. This focal opacity also has a linear configuration.  Linear atelectasis in the left lower lobe and lingula, unchanged. Pulmonary vascularity normal without evidence of pulmonary edema. No pleural effusions.  Visualized bony thorax intact.  IMPRESSION: Borderline heart size.  Worsening atelectasis in the right lower lobe.  Stable atelectasis in the left lower lobe.   Original Report Authenticated By: Hulan Saas, M.D.   US Ob Transvaginal 01/26/2013   *RADIOLOGY REPORT*  Clinical Data: Recent missed abortion.  Persistent bleeding. Question retained products of conception.  TRANSVAGINAL OB ULTRASOUND  Technique:  Transvaginal ultrasound was performed for evaluation of the gestation as well as the maternal uterus and adnexal regions.  Comparison: Transvaginal obstetric ultrasound 01/13/2013.  Findings: No gestational sac is identified.  The endometrial cavity is expanded within the lower uterine segment with heterogeneous echogenicity suggesting retained products of conception.  The adnexa are within normal limits bilaterally.  No significant free fluid is present.  IMPRESSION:  1.  Heterogeneous material in the endometrial cavity within the lower uterine segment is compatible with retained products of conception. 2.  No gestational sac is present.   Original Report Authenticated By: Marin Roberts, M.D.   US Transvaginal Non-ob 02/03/2013   *  RADIOLOGY REPORT*  Clinical Data: Post D&C earlier in the day for retained products of conception.  Fever with elevated white count.  6 cm mass palpated in the cul-de-sac prior to D&C.  TRANSABDOMINAL AND TRANSVAGINAL ULTRASOUND OF PELVIS Technique:  Both transabdominal and transvaginal ultrasound examinations of the  pelvis were performed. Transabdominal technique was performed for global imaging of the pelvis including uterus, ovaries, adnexal regions, and pelvic cul-de-sac.  It was necessary to proceed with endovaginal exam following the transabdominal exam to visualize the uterus and adnexa.  Comparison:  07/04, 07/11 and 01/25/2013  Findings:  Uterus: Is best visualized with a sagittal cine loop evaluation endovaginally and transabdominally but remains difficult to see on this exam.  The uterus is retroverted and retroflexed and demonstrates an axial position on endovaginal exam due to the findings in the cul-de-sac. A sagittal length of approximate 14 cm is noted.  A depth of 5.6 cm and width of 8.4 cm is identified. Full evaluation of the myometrium is difficult but no gross myometrial abnormalities are suggested  Endometrium:  There appears to be some hypoechoic material within the endometrial canal which demonstrates no intralesional flow and is most compatible with blood products within the canal post D&C.  Right ovary:  A normal appearing right ovary is not seen with confidence  Left ovary: A normal appearing left ovary is not seen with confidence  Other findings: There is a heterogeneous soft tissue mass with vascularity in the left adnexa measuring 8.0 x 4.6 cm by 3.4 cm. This extends towards the cul-de-sac which contains a complex fluid collection measuring 6.9 by 4.0 x 3.7 cm that contains layering complex fluid.  These findings are new in comparison with the prior exams and given the history of fever and elevated white count are suspicious for a pelvic abscess.  IMPRESSION: Heterogeneous soft tissue mass with complex fluid in the pelvis as described above.  Given the clinical presentation, this finding is suspicious for pelvic abscess.  Further evaluation with CT with contrast is recommended for more global assessment of the pelvis.  Hypoechoic avascular material within the endometrial canal likely represents  residual blood products post D&C.  This study was discussed in person with Dr. Debroah Loop.   Original Report Authenticated By: Rhodia Albright, M.D.    Ct Abdomen Pelvis W Contrast 02/03/2013   *RADIOLOGY REPORT*  Clinical Data: D and C procedure earlier today.  Abnormal ultrasound examination earlier today questioning a pelvic abscess.  CT ABDOMEN AND PELVIS WITH CONTRAST  Technique:  Multidetector CT imaging of the abdomen and pelvis was performed following the standard protocol during bolus administration of intravenous contrast.  Contrast: OMNIPAQUE IOHEXOL 300 MG/ML IV. Oral contrast was also administered.  Comparison: Pelvic ultrasound earlier same date.  No prior CT.  Findings: Approximate 10.2 x 5.1 x 5.4 cm heterogeneous collection in the left side of the pelvis, with extension into the presacral space in the lower pelvis and extension upward into the retroperitoneum of the left upper pelvis overlying the left psoas muscle.  Small amount of simple free fluid in the cul-de-sac. Uterus displaced slightly rightward by the collection.  Small amount of endometrial fluid.  No free intraperitoneal air.  Upper normal-sized liver without focal parenchymal abnormality. Layering sludge in the gallbladder without visible gallstones.  No biliary ductal dilation.  Normal-appearing spleen, pancreas, adrenal glands, and kidneys.  Normal-appearing vascular structures. No significant lymphadenopathy.  Stomach normal in appearance, filled with food.  Normal-appearing small bowel and colon.  Appendix  not clearly visualized, but no pericecal inflammation.  Bone window images unremarkable.  Atelectasis in the visualized lower lobes.  Heart size normal.  IMPRESSION:  1. Hematoma (favored over an abscess) in the left side of the pelvis, with extension into the presacral space of the low pelvis and the retroperitoneum of the upper pelvis. 2.  Small amount of simple free fluid in the cul-de-sac. 3.  Layering sludge within the  gallbladder.  No calcified gallstones. 4.  Atelectasis in the visualized lower lobes.   Original Report Authenticated By: Hulan Saas, M.D.   Ct Abdomen Pelvis W Contrast 02/07/2013   *RADIOLOGY REPORT*  Clinical Data: Fever. Elevated white blood cell count.  Status post dilatation and curettage 02/03/2013.  CT ABDOMEN AND PELVIS WITH CONTRAST  Technique:  Multidetector CT imaging of the abdomen and pelvis was performed following the standard protocol during bolus administration of intravenous contrast.  Contrast: OMNIPAQUE IOHEXOL 300 MG/ML  SOLN  Comparison: CT abdomen and pelvis 02/03/2013.  Findings: Basilar airspace opacities, worse on the right, are again seen and appear slightly improved.  No pleural or pericardial effusion is identified.  The fluid collection in the left hemi pelvis seen on the prior study appears more organized on today's examination with rim enhancement present.  The collection measures 5.7 cm A P by 4.4 cm transverse by 3.5 cm cranial-caudal. This collection may be within the ovary.  A second collection is seen in the cul-de-sac measuring 6.2 cm transverse by 4.4 cm AP by 3.1 cm cranial-caudal. There is some rim enhancement about this collection.  The liver, gallbladder, adrenal glands, spleen, pancreas and kidneys appear normal.  Urinary bladder and uterus are unremarkable.  Degenerative disease is seen about the right hip. No gross bony lesion.  IMPRESSION:  Rim enhancing fluid collections within or adjacent to the left ovary and in the cul-de-sac highly suspicious for abscesses.   Original Report Authenticated By: Holley Dexter, M.D.   Ct Image Guided Fluid Drain By Catheter 02/08/2013   *RADIOLOGY REPORT*  Indication: History of recent D&E for missed abortion with persistent leukocytosis and fever with recent imaging demonstrating enlarging pelvic fluid collections concerning for abscesses.  1.  CT GUIDED PELVIC DRAINAGE CATHETER PLACEMENT VIA RIGHT TRANGLUTEAL APPROACH  2.  CT GUIDED PELVIC DRAINAGE CATHETER PLACEMENT VIA LEFT LOWER ABDOMINAL APPROACH  Comparison: CT abdomen pelvis - 02/07/2013; 02/03/2013  Medications: Fentanyl 400 mcg IV; Versed 7 mg IV  Total Moderate Sedation time: 60 minutes  Contrast: None  Complications: None immediate  Technique / Findings:  Informed written consent was obtained from the patient after a discussion of the risks, benefits and alternatives to treatment. Initially, the patient was placed prone on the CT gantry and a pre procedural CT was performed re-demonstrating the known abscess/fluid collection within the right hemi pelvis.  The procedure was planned.   A timeout was performed prior to the initiation of the procedure.  The right buttocks was prepped and draped in the usual sterile fashion.   The overlying soft tissues were anesthetized with 1% lidocaine with epinephrine.  Appropriate trajectory was planned with the use of a 22 gauge spinal needle.  An 18 gauge trocar needle was advanced into the abscess/fluid collection and a short Amplatz super stiff wire was coiled within the collection. Appropriate positioning was confirmed with a limited CT scan.  The tract was serially dilated allowing placement of a 10 [place] all- purpose drainage catheter.  Appropriate positioning was confirmed with a limited postprocedural CT scan.  Approximately 55 ml of purulent fluid was aspirated.  The tube was connected to a JP bulb and sutured in place. The patient was then positioned supine on the CT gantry and a preprocedural CT scan was performed demonstrating grossly unchanged appearance of the serpiginous fluid collection within the left hemi pelvis/adnexa.  Appropriate trajectory was planned with the use of a 22 gauge spinal needle.  An 18 gauge trocar needle was advanced into the abscess/fluid collection and a short Amplatz superstiff wire was coiled within the collection.  Appropriate positioning was confirmed with a limited CT scan.  The track was  serially dilated ultimately allowing placement of a 10-French all-purpose drainage catheter.  Under intermittent CT fluoroscopic guidance, the catheter was slowly retracted ultimately positioned within the left adnexa.  Approximately 45 ml of purulent fluid was aspirated.  The tube was connected to JP bulb and sutured in place.  Dressings were placed.  The patient tolerated the procedures well without immediate post procedural complication.  Impression:  1.  Successful CT guided placement of a 10 [place] all purpose drain catheter into the right hemi pelvis via right-sided transgluteal approach with aspiration of 55 mL of purulent fluid.  2.  Successful CT guided placement of a 10-French all-purpose drainage catheter into the left pelvis via left anterior lower abdominal approach with aspiration of approximately 45 ml of purulent fluid.  3.  Samples from both collections were sent to the laboratory for analysis.   Original Report Authenticated By: Tacey Ruiz, MD   Ct Abdomen Pelvis W Contrast 02/12/2013   *RADIOLOGY REPORT*  Clinical Data: Bilateral tubo-ovarian abscesses, post percutaneous drainage catheter placement.  CT ABDOMEN AND PELVIS WITH CONTRAST  Technique:  Multidetector CT imaging of the abdomen and pelvis was performed following the standard protocol during bolus administration of intravenous contrast.  Contrast: OMNIPAQUE IOHEXOL 300 MG/ML  SOLN, OMNIPAQUE IOHEXOL 300 MG/ML  SOLN  Comparison: 02/08/2013 and earlier studies  Findings: Plate-like atelectasis in both visualized lung bases. There is a trace left pleural effusion now evident.  There is mild periportal edema in the liver without focal lesion.  Gallbladder is nondistended.  Unremarkable spleen, adrenal glands, pancreas. There are retroperitoneal inflammatory/edematous changes with enlarged precaval nodes as before.  Portal vein is patent.  Right transgluteal drain is well positioned,  with decompression of the dominant  posterior fluid component.  Cephalad to the drain just to the right of midline is a small loculated collection measuring approximately 3 cm maximal transverse diameter.  There is a small amount of presacral inflammatory/edematous change.  The left lower quadrant drain is stable position, with decompression of   much of the left sided fluid.  There is still a small adjacent gas and fluid collection measuring approximately 3.6 cm maximal transverse diameter.  No new or undrained left-sided collections.  Uterus unremarkable.  Urinary bladder incompletely distended.  No ascites.  No free air. Stable subchondral cysts or geodes in the superior right acetabulum.  IMPRESSION:  1.  Significant improvement in bilateral pelvic collections after bilateral drainage catheter placement.  There is a 3.6 cm residual collection adjacent to the left sided drain, and a 3 cm residual collection cephalad to the right-sided drain. 2.  Persistent precaval adenopathy with some increase in adjacent inflammatory/edematous change, presumably reactive.   Original Report Authenticated By: D. Andria Rhein, MD   Treatments: Antibiotics: Zosyn and Doxycycline  Discharge Exam: Blood pressure 150/93, pulse 86, temperature 99.3 F (37.4 C), temperature source Oral, resp. rate  20, height 5\' 1"  (1.549 m), weight 183 lb (83.008 kg), last menstrual period 02/04/2013, SpO2 97.00%. General appearance: cooperative and no distress Resp: clear to auscultation bilaterally Cardio: regular rate and rhythm GI: soft, non-tender; bowel sounds normal; no masses,  no organomegaly Extremities: extremities normal, atraumatic, no cyanosis or edema  Disposition: 01-Home or Self Care  Discharge Orders   Future Appointments Provider Department Dept Phone   03/08/2013 12:45 PM Adam Phenix, MD [facility] [phone]   Future Orders Complete By Expires     Discharge patient  As directed     Comments:      To home        Medication List     STOP taking these medications       acetaminophen 500 MG tablet  Commonly known as:  TYLENOL      TAKE these medications       doxycycline 100 MG capsule  Commonly known as:  VIBRAMYCIN  Take 1 capsule (100 mg total) by mouth 2 (two) times daily.     ibuprofen 800 MG tablet  Commonly known as:  ADVIL,MOTRIN  Take 1 tablet (800 mg total) by mouth every 8 (eight) hours as needed for pain.     metroNIDAZOLE 500 MG tablet  Commonly known as:  FLAGYL  Take 1 tablet (500 mg total) by mouth 3 (three) times daily.     oxyCODONE-acetaminophen 5-325 MG per tablet  Commonly known as:  PERCOCET/ROXICET  Take 1 tablet by mouth every 8 (eight) hours as needed for pain.     promethazine 25 MG tablet  Commonly known as:  PHENERGAN  Take 1 tablet (25 mg total) by mouth every 6 (six) hours as needed for nausea.           Follow-up Information   Follow up with [facility] On 03/08/2013. (at 12:45)    Contact information:   [address] [place] [address] [phone]      Signed: Tereso Newcomer, M.D. 02/13/2013, 6:30 PM

## 2013-02-15 LAB — CULTURE, ROUTINE-ABSCESS

## 2013-03-02 MED ORDER — HYDROMORPHONE HCL 2 MG PO TABS
ORAL_TABLET | ORAL | Status: AC
  Filled 2013-03-02: qty 1

## 2013-03-08 ENCOUNTER — Encounter: Payer: Self-pay | Admitting: Obstetrics & Gynecology

## 2013-04-18 ENCOUNTER — Emergency Department (HOSPITAL_COMMUNITY): Payer: Medicaid Other

## 2013-04-18 ENCOUNTER — Inpatient Hospital Stay (HOSPITAL_COMMUNITY)
Admission: EM | Admit: 2013-04-18 | Discharge: 2013-04-21 | DRG: 759 | Disposition: A | Discharge status: Home / Self Care | Payer: Medicaid Other | Attending: Obstetrics & Gynecology | Admitting: Obstetrics & Gynecology

## 2013-04-18 ENCOUNTER — Encounter (HOSPITAL_COMMUNITY): Payer: Self-pay | Admitting: Emergency Medicine

## 2013-04-18 DIAGNOSIS — R1031 Right lower quadrant pain: Secondary | ICD-10-CM | POA: Diagnosis present

## 2013-04-18 DIAGNOSIS — N739 Female pelvic inflammatory disease, unspecified: Secondary | ICD-10-CM | POA: Diagnosis present

## 2013-04-18 DIAGNOSIS — N7093 Salpingitis and oophoritis, unspecified: Secondary | ICD-10-CM | POA: Diagnosis present

## 2013-04-18 DIAGNOSIS — R000 Tachycardia, unspecified: Secondary | ICD-10-CM | POA: Diagnosis present

## 2013-04-18 DIAGNOSIS — Z9089 Acquired absence of other organs: Secondary | ICD-10-CM

## 2013-04-18 DIAGNOSIS — R509 Fever, unspecified: Secondary | ICD-10-CM | POA: Diagnosis present

## 2013-04-18 LAB — COMPREHENSIVE METABOLIC PANEL
AST: 15 U/L (ref 0–37)
Albumin: 3.8 g/dL (ref 3.5–5.2)
Chloride: 102 mEq/L (ref 96–112)
Creatinine, Ser: 0.67 mg/dL (ref 0.50–1.10)
Potassium: 3.6 mEq/L (ref 3.5–5.1)
Total Bilirubin: 0.5 mg/dL (ref 0.3–1.2)
Total Protein: 8.2 g/dL (ref 6.0–8.3)

## 2013-04-18 LAB — CBC WITH DIFFERENTIAL/PLATELET
Basophils Absolute: 0 10*3/uL (ref 0.0–0.1)
Eosinophils Absolute: 0 10*3/uL (ref 0.0–0.7)
Lymphocytes Relative: 16 % (ref 12–46)
Lymphs Abs: 2.6 10*3/uL (ref 0.7–4.0)
Monocytes Relative: 6 % (ref 3–12)
Platelets: 315 10*3/uL (ref 150–400)
RDW: 14.8 % (ref 11.5–15.5)
WBC: 16 10*3/uL — ABNORMAL HIGH (ref 4.0–10.5)

## 2013-04-18 LAB — URINALYSIS, ROUTINE W REFLEX MICROSCOPIC
Bilirubin Urine: NEGATIVE
Glucose, UA: NEGATIVE mg/dL
Protein, ur: NEGATIVE mg/dL
Specific Gravity, Urine: 1.008 (ref 1.005–1.030)

## 2013-04-18 LAB — URINE MICROSCOPIC-ADD ON

## 2013-04-18 MED ORDER — SODIUM CHLORIDE 0.9 % IV SOLN
1000.0000 mL | INTRAVENOUS | Status: DC
  Administered 2013-04-19: 1000 mL via INTRAVENOUS

## 2013-04-18 MED ORDER — VANCOMYCIN HCL IN DEXTROSE 1-5 GM/200ML-% IV SOLN
1000.0000 mg | Freq: Once | INTRAVENOUS | Status: AC
  Administered 2013-04-19: 1000 mg via INTRAVENOUS
  Filled 2013-04-18: qty 200

## 2013-04-18 MED ORDER — VANCOMYCIN HCL IN DEXTROSE 1-5 GM/200ML-% IV SOLN
1000.0000 mg | Freq: Three times a day (TID) | INTRAVENOUS | Status: DC
  Administered 2013-04-19 – 2013-04-20 (×4): 1000 mg via INTRAVENOUS
  Filled 2013-04-18 (×5): qty 200

## 2013-04-18 MED ORDER — SODIUM CHLORIDE 0.9 % IV SOLN
1000.0000 mL | Freq: Once | INTRAVENOUS | Status: AC
  Administered 2013-04-19: 1000 mL via INTRAVENOUS

## 2013-04-18 MED ORDER — ONDANSETRON HCL 4 MG/2ML IJ SOLN
4.0000 mg | INTRAMUSCULAR | Status: AC
  Administered 2013-04-18: 4 mg via INTRAVENOUS
  Filled 2013-04-18: qty 2

## 2013-04-18 MED ORDER — ACETAMINOPHEN 325 MG PO TABS
650.0000 mg | ORAL_TABLET | Freq: Once | ORAL | Status: AC
  Administered 2013-04-18: 650 mg via ORAL

## 2013-04-18 MED ORDER — SODIUM CHLORIDE 0.9 % IV SOLN
1000.0000 mL | Freq: Once | INTRAVENOUS | Status: AC
  Administered 2013-04-18: 1000 mL via INTRAVENOUS

## 2013-04-18 MED ORDER — PIPERACILLIN-TAZOBACTAM 3.375 G IVPB
3.3750 g | Freq: Three times a day (TID) | INTRAVENOUS | Status: DC
  Administered 2013-04-19 – 2013-04-21 (×6): 3.375 g via INTRAVENOUS
  Filled 2013-04-18 (×10): qty 50

## 2013-04-18 MED ORDER — IOHEXOL 300 MG/ML  SOLN
20.0000 mL | INTRAMUSCULAR | Status: AC
  Administered 2013-04-18: 25 mL via ORAL

## 2013-04-18 MED ORDER — PIPERACILLIN-TAZOBACTAM 3.375 G IVPB 30 MIN
3.3750 g | Freq: Once | INTRAVENOUS | Status: AC
  Administered 2013-04-19: 3.375 g via INTRAVENOUS
  Filled 2013-04-18: qty 50

## 2013-04-18 NOTE — ED Notes (Signed)
Pt to Ultrasound at this time via wheelchair.

## 2013-04-18 NOTE — ED Provider Notes (Signed)
CSN: [id number]     Arrival date & time 04/18/13  1902 History   First MD Initiated Contact with Patient 04/18/13 2026     Chief Complaint  Patient presents with  . Abdominal Pain   (Consider location/radiation/quality/duration/timing/severity/associated sxs/prior Treatment) HPI Comments: Patient is a 37 year old [id number] female with a history of C-section x 3 and D&C x 1 who presents for RLQ abdominal pain with onset this morning. Patient states that pain has been constant since onset and gradually worsening. Pain is sharp, aching, and throbbing and worse when lying supine and with palpation; pain has no alleviating factors. Patient endorses associated nausea without emesis. She denies fevers, chest pain or shortness of breath, diarrhea, melena or hematochezia, urinary symptoms, vaginal bleeding or discharge, and numbness or tingling. Patient is febrile on arrival 102.49F. She denies a history of cholecystectomy or appendectomy. Patient states that she had a miscarriage this past August which resulted in a D&C as well as a pelvic abscess which required draining and IV antibiotics. Patient denies any complications since hospital discharge. Patient denies being followed by an OBGYN at present.  The history is provided by the patient. No language interpreter was used.    Past Medical History  Diagnosis Date  . Medical history non-contributory    Past Surgical History  Procedure Laterality Date  . Cesarean section    . Dilation and evacuation N/A 02/03/2013    Procedure: DILATATION AND EVACUATION;  Surgeon: Adam Phenix, MD;  Location: [facility];  Service: Gynecology;  Laterality: N/A;   History reviewed. No pertinent family history. History  Substance Use Topics  . Smoking status: Current Every Day Smoker    Types: Cigarettes  . Smokeless tobacco: Not on file  . Alcohol Use: No   OB History   Grav Para Term Preterm Abortions TAB SAB Ect Mult Living   3 3        3      Review of Systems   Constitutional: Negative for fever.  Respiratory: Negative for shortness of breath.   Cardiovascular: Negative for chest pain.  Gastrointestinal: Positive for nausea and abdominal pain. Negative for vomiting.  Genitourinary: Negative for dysuria, vaginal bleeding and vaginal discharge.  Neurological: Negative for weakness and numbness.  All other systems reviewed and are negative.    Allergies  Strawberry  Home Medications  No current outpatient prescriptions on file. BP 121/74  Pulse 98  Temp(Src) 98.9 F (37.2 C) (Oral)  Resp 15  SpO2 99% Physical Exam  Nursing note and vitals reviewed. Constitutional: She is oriented to person, place, and time. She appears well-nourished. No distress.  HENT:  Head: Normocephalic and atraumatic.  Mouth/Throat: Oropharynx is clear and moist. No oropharyngeal exudate.  Eyes: Conjunctivae and EOM are normal. Pupils are equal, round, and reactive to light. No scleral icterus.  Neck: Normal range of motion.  Cardiovascular: Regular rhythm, normal heart sounds and intact distal pulses.  Tachycardia present.   Pulmonary/Chest: Effort normal and breath sounds normal. No respiratory distress. She has no wheezes. She has no rales.  Abdominal: Soft. She exhibits no distension. There is tenderness. There is guarding. There is no rebound.  Mild peritoneal signs and involuntary guarding with focal tenderness to palpation in the right lower quadrant and suprapubic region. Patient in extreme pain when attempting to recline the head of the exam room bed to make patient supine; patient cries out in extreme pain and becomes tearful, grabbing her RLQ.  Musculoskeletal: Normal range of motion.  Neurological: She  is alert and oriented to person, place, and time.  Skin: Skin is warm and dry. No rash noted. She is not diaphoretic. No erythema. No pallor.  Psychiatric: She has a normal mood and affect. Her behavior is normal.    ED Course  Procedures (including  critical care time) Labs Review Labs Reviewed  CBC WITH DIFFERENTIAL - Abnormal; Notable for the following:    WBC 16.0 (*)    Neutrophils Relative % 78 (*)    Neutro Abs 12.4 (*)    All other components within normal limits  COMPREHENSIVE METABOLIC PANEL - Abnormal; Notable for the following:    Glucose, Bld 108 (*)    All other components within normal limits  URINALYSIS, ROUTINE W REFLEX MICROSCOPIC - Abnormal; Notable for the following:    APPearance CLOUDY (*)    Hgb urine dipstick TRACE (*)    Leukocytes, UA TRACE (*)    All other components within normal limits  CULTURE, BLOOD (ROUTINE X 2)  CULTURE, BLOOD (ROUTINE X 2)  URINE MICROSCOPIC-ADD ON  POCT PREGNANCY, URINE  CG4 I-STAT (LACTIC ACID)   Imaging Review US Transvaginal Non-ob  04/19/2013   CLINICAL DATA:  Abdominal pain. Rule out torsion.  EXAM: TRANSABDOMINAL AND TRANSVAGINAL ULTRASOUND OF PELVIS  DOPPLER ULTRASOUND OF OVARIES  TECHNIQUE: Both transabdominal and transvaginal ultrasound examinations of the pelvis were performed. Transabdominal technique was performed for global imaging of the pelvis including uterus, ovaries, adnexal regions, and pelvic cul-de-sac.  It was necessary to proceed with endovaginal exam following the transabdominal exam to visualize the adnexa. Color and duplex Doppler ultrasound was utilized to evaluate blood flow to the ovaries.  COMPARISON:  02/03/2013  FINDINGS: Uterus  Measurements: 13 x 4 x 8 cm. No fibroids or other mass visualized.  Endometrium  Thickness: Around 2 mm.  No focal abnormality visualized.  Right ovary  Measurements: Complex appearing right adnexa, likely with fluid collection around the ovary, which in total measures 12 x 6 x 9 cm. The ovary, centrally within the fluid, has normal low resistance arterial and venous waveform. One of the spectral tracings of the right adnexa is high resistance arterial, likely non ovarian.  Left ovary  Measurements: Uncertain size due to complex  appearance of the left adnexa status post recent abscess drainage. There is an anechoic cyst with smaller internal cyst, measuring 6 cm. Spectral interrogation in the left adnexa has a low resistance arterial and venous waveform.  Pulsed Doppler evaluation of both ovaries as described above.  IMPRESSION: 1. Question progression/recurrence of pelvic inflammatory disease, with right tubo-ovarian complex or abscess. Infused abdominal CT suggested. 2. 6 cm left adnexal cyst which could represent an inclusion cyst or remnant from previously treated abscess. 3. Doubt ovarian torsion, although the pelvic inflammatory changes limited assessment.   Electronically Signed   By: Tiburcio Pea M.D.   On: 04/19/2013 00:10   US Pelvis Complete  04/19/2013   CLINICAL DATA:  Abdominal pain. Rule out torsion.  EXAM: TRANSABDOMINAL AND TRANSVAGINAL ULTRASOUND OF PELVIS  DOPPLER ULTRASOUND OF OVARIES  TECHNIQUE: Both transabdominal and transvaginal ultrasound examinations of the pelvis were performed. Transabdominal technique was performed for global imaging of the pelvis including uterus, ovaries, adnexal regions, and pelvic cul-de-sac.  It was necessary to proceed with endovaginal exam following the transabdominal exam to visualize the adnexa. Color and duplex Doppler ultrasound was utilized to evaluate blood flow to the ovaries.  COMPARISON:  02/03/2013  FINDINGS: Uterus  Measurements: 13 x 4 x 8 cm.  No fibroids or other mass visualized.  Endometrium  Thickness: Around 2 mm.  No focal abnormality visualized.  Right ovary  Measurements: Complex appearing right adnexa, likely with fluid collection around the ovary, which in total measures 12 x 6 x 9 cm. The ovary, centrally within the fluid, has normal low resistance arterial and venous waveform. One of the spectral tracings of the right adnexa is high resistance arterial, likely non ovarian.  Left ovary  Measurements: Uncertain size due to complex appearance of the left adnexa  status post recent abscess drainage. There is an anechoic cyst with smaller internal cyst, measuring 6 cm. Spectral interrogation in the left adnexa has a low resistance arterial and venous waveform.  Pulsed Doppler evaluation of both ovaries as described above.  IMPRESSION: 1. Question progression/recurrence of pelvic inflammatory disease, with right tubo-ovarian complex or abscess. Infused abdominal CT suggested. 2. 6 cm left adnexal cyst which could represent an inclusion cyst or remnant from previously treated abscess. 3. Doubt ovarian torsion, although the pelvic inflammatory changes limited assessment.   Electronically Signed   By: Tiburcio Pea M.D.   On: 04/19/2013 00:10   Ct Abdomen Pelvis W Contrast  04/19/2013   *RADIOLOGY REPORT*  Clinical Data: Right lower quadrant pain, reported abscess on the pelvis reportedly drained.  CT ABDOMEN AND PELVIS WITH CONTRAST  Technique:  Multidetector CT imaging of the abdomen and pelvis was performed following the standard protocol during bolus administration of intravenous contrast.  Contrast: OMNIPAQUE IOHEXOL 300 MG/ML  SOLN  Comparison: Pelvic sonogram April 18, 2013, CT of abdomen February 12, 2013.  Findings: Limited view of the lung bases are clear.  The included heart and pericardium are unremarkable.  Within the right pelvis, and expected location of the right adnexa is a 5.6 x 8.9 x 10.2 cm (transverse by AP by cc) rim enhancing complex fluid collection, without calcifications nor radiopaque foreign bodies or subcutaneous gas.  There is extensive surrounding inflammatory changes and trace free fluid.  Fluid collection corresponds to that seen on the pelvic sonogram April 18, 2013. Posterior to the fluid collection is the ill-defined soft tissue density within the right retroperitoneal measuring up to 2.5 cm suggesting phlegmon.  In addition, low density 5.8 cm left adnexal cyst, previously characterized on ultrasound as simple cyst.  The liver,  spleen, pancreas, gallbladder and adrenal glands are normal in size, morphology and enhancement characteristics.  The stomach, small are normal in course and caliber without definite wall thickening or inflammatory changes, though there is mass effect on the inferior margin of the cecum due to the right adnexal mass.  No free fluid nor free air.  The kidneys are well-located, demonstrating normal morphology, size and enhancement without renal masses, nephrolithiasis or hydronephrosis.  Ureters are unremarkable.  Urinary bladder is partially distended and appears normal.  Great vessels are normal in course and caliber.  No lymphadenopathy by CT size criteria, small precaval lymph nodes.  Internal reproductive organs appear normal.  The included soft tissues and osseous structures are non-suspicious.  IMPRESSION: 5.6 x 8.9 x 10.2 cm right pelvic rim enhancing fluid collection concerning for recurrent tubal ovarian abscess.  This is similar to pelvic sonogram April 18, 2013 though, greatly increased in size from CT of abdomen and pelvis February 12, 2013.  6 cm presumed cyst in the left ovary.   Original Report Authenticated By: Awilda Metro   [place] Art/ven Flow Abd Pelv Doppler  04/19/2013   CLINICAL DATA:  Abdominal pain. Rule  out torsion.  EXAM: TRANSABDOMINAL AND TRANSVAGINAL ULTRASOUND OF PELVIS  DOPPLER ULTRASOUND OF OVARIES  TECHNIQUE: Both transabdominal and transvaginal ultrasound examinations of the pelvis were performed. Transabdominal technique was performed for global imaging of the pelvis including uterus, ovaries, adnexal regions, and pelvic cul-de-sac.  It was necessary to proceed with endovaginal exam following the transabdominal exam to visualize the adnexa. Color and duplex Doppler ultrasound was utilized to evaluate blood flow to the ovaries.  COMPARISON:  02/03/2013  FINDINGS: Uterus  Measurements: 13 x 4 x 8 cm. No fibroids or other mass visualized.  Endometrium  Thickness: Around 2 mm.  No  focal abnormality visualized.  Right ovary  Measurements: Complex appearing right adnexa, likely with fluid collection around the ovary, which in total measures 12 x 6 x 9 cm. The ovary, centrally within the fluid, has normal low resistance arterial and venous waveform. One of the spectral tracings of the right adnexa is high resistance arterial, likely non ovarian.  Left ovary  Measurements: Uncertain size due to complex appearance of the left adnexa status post recent abscess drainage. There is an anechoic cyst with smaller internal cyst, measuring 6 cm. Spectral interrogation in the left adnexa has a low resistance arterial and venous waveform.  Pulsed Doppler evaluation of both ovaries as described above.  IMPRESSION: 1. Question progression/recurrence of pelvic inflammatory disease, with right tubo-ovarian complex or abscess. Infused abdominal CT suggested. 2. 6 cm left adnexal cyst which could represent an inclusion cyst or remnant from previously treated abscess. 3. Doubt ovarian torsion, although the pelvic inflammatory changes limited assessment.   Electronically Signed   By: Tiburcio Pea M.D.   On: 04/19/2013 00:10   Dg Chest Port 1 View  04/18/2013   CLINICAL DATA:  Fever and abdominal pains.  EXAM: PORTABLE CHEST - 1 VIEW  COMPARISON:  02/04/2013  FINDINGS: Single view of the chest demonstrates clear lungs. Heart and mediastinum are within normal limits. The trachea is midline. Bony thorax is intact.  IMPRESSION: No acute chest findings.   Electronically Signed   By: Richarda Overlie M.D.   On: 04/18/2013 20:57    EKG Interpretation   None      CRITICAL CARE Performed by: Antony Madura   Total critical care time: 40  Critical care time was exclusive of separately billable procedures and treating other patients.  Critical care was necessary to treat or prevent imminent or life-threatening deterioration.  Critical care was time spent personally by me on the following activities:  development of treatment plan with patient and/or surrogate as well as nursing, discussions with consultants, evaluation of patient's response to treatment, examination of patient, obtaining history from patient or surrogate, ordering and performing treatments and interventions, ordering and review of laboratory studies, ordering and review of radiographic studies, pulse oximetry and re-evaluation of patient's condition.   MDM   1. Right tubo-ovarian abscess    37 year old female presents for right lower quadrant abdominal pain with onset this morning. Patient meets sepsis criteria on arrival; febrile to 102.63F and tachy to 130 with a leukocytosis of 16.4. Labs otherwise stable. Patient exquisitely TTP in R mid abdomen with mild involuntary guarding and an intolerance to laying supine. Tachycardia and fever improved with IVF and tylenol. Tx in ED with IV vancomycin and zosyn. Labs stable.   CT scan significant for 10cm right TOA, also present on pelvic U/S; no evidence to suggest torsion. Patient has a hx of TOA and was d/c'd on 02/13/13 after surgical drainage. Have  spoken with Dr. Erin Fulling on call for OBGYN. Will have patient transferred to [facility] for further care. EMTALA completed and patient stable for transfer.   Filed Vitals:   04/18/13 2110 04/18/13 2115 04/18/13 2130 04/18/13 2145  BP:  107/79 117/73 121/74  Pulse:  114 98 98  Temp: 98.9 F (37.2 C)     TempSrc:      Resp:   14 15  SpO2:  100% 100% 99%       Antony Madura, PA-C 04/19/13 0207

## 2013-04-18 NOTE — ED Notes (Signed)
Pt drinking contrast at this time.

## 2013-04-18 NOTE — Progress Notes (Signed)
ANTIBIOTIC CONSULT NOTE - INITIAL  Pharmacy Consult for vancomycin and zosyn Indication: rule out sepsis  Allergies  Allergen Reactions  . Strawberry Swelling    Patient Measurements: weight 84 kg , height 60 inches (per patient)    Vital Signs: Temp: 98.9 F (37.2 C) (10/14 2110) Temp src: Oral (10/14 1910) BP: 118/97 mmHg (10/14 1910) Pulse Rate: 130 (10/14 1910) Intake/Output from previous day:   Intake/Output from this shift:    Labs:  Recent Labs  04/18/13 1916  WBC 16.0*  HGB 13.3  PLT 315  CREATININE 0.67   The CrCl is unknown because both a height and weight (above a minimum accepted value) are required for this calculation. No results found for this basename: VANCOTROUGH, VANCOPEAK, VANCORANDOM, GENTTROUGH, GENTPEAK, GENTRANDOM, TOBRATROUGH, TOBRAPEAK, TOBRARND, AMIKACINPEAK, AMIKACINTROU, AMIKACIN,  in the last 72 hours   Microbiology: No results found for this or any previous visit (from the past 720 hour(s)).  Medical History: Past Medical History  Diagnosis Date  . Medical history non-contributory     Assessment: Patient is a 37 y.o F presented to the ED with c/o right lower quadrant pain. In August of this year, she was hospitalized at [facility] for almost 2 weeks for Pelvic abscess/Tuboovarian abscess.  Her abscesses were drained on 8/06.  During her hospital course she received cefotetan from 8/01-8/04, Doxycycline from 8/05-8/11, and zosyn from 8/04-8/11.  She was then discharged on PO doxycycline and flagyl for an additional 10 days.  To start vancomycin and zosyn for empiric broad coverage.   Goal of Therapy:  Vancomycin trough level 15-20 mcg/ml  Plan:  1) vancomycin 1gm IV q8h 2) zosyn 3.375gm IV x1 over 30 minutes, then zosyn 3.375gm IV q8h (over 4 hours)  Cameran Ahmed P 04/18/2013,9:24 PM

## 2013-04-18 NOTE — ED Notes (Signed)
Pt states right lower quadrant pain, pt states that she has an abscess on her pelvis that she had to have surgically drained. Pt unsure if the abscess is back.

## 2013-04-18 NOTE — ED Notes (Signed)
Pt remains in ultrasound at this time.

## 2013-04-19 ENCOUNTER — Encounter (HOSPITAL_COMMUNITY): Payer: Self-pay | Admitting: Radiology

## 2013-04-19 ENCOUNTER — Emergency Department (HOSPITAL_COMMUNITY): Payer: Medicaid Other

## 2013-04-19 LAB — CG4 I-STAT (LACTIC ACID): Lactic Acid, Venous: 1.03 mmol/L (ref 0.5–2.2)

## 2013-04-19 MED ORDER — IOHEXOL 300 MG/ML  SOLN
100.0000 mL | Freq: Once | INTRAMUSCULAR | Status: AC | PRN
  Administered 2013-04-19: 100 mL via INTRAVENOUS

## 2013-04-19 MED ORDER — KETOROLAC TROMETHAMINE 30 MG/ML IJ SOLN
30.0000 mg | Freq: Four times a day (QID) | INTRAMUSCULAR | Status: AC
  Administered 2013-04-19 – 2013-04-20 (×5): 30 mg via INTRAVENOUS
  Filled 2013-04-19 (×5): qty 1

## 2013-04-19 MED ORDER — ONDANSETRON HCL 4 MG/2ML IJ SOLN
4.0000 mg | INTRAMUSCULAR | Status: AC
  Administered 2013-04-19: 4 mg via INTRAVENOUS
  Filled 2013-04-19: qty 2

## 2013-04-19 MED ORDER — HYDROMORPHONE HCL PF 1 MG/ML IJ SOLN
1.0000 mg | Freq: Once | INTRAMUSCULAR | Status: AC
  Administered 2013-04-19: 1 mg via INTRAVENOUS
  Filled 2013-04-19: qty 1

## 2013-04-19 MED ORDER — KCL IN DEXTROSE-NACL 20-5-0.45 MEQ/L-%-% IV SOLN
INTRAVENOUS | Status: DC
  Administered 2013-04-19 – 2013-04-21 (×3): via INTRAVENOUS
  Filled 2013-04-19 (×8): qty 1000

## 2013-04-19 MED ORDER — PRENATAL MULTIVITAMIN CH
1.0000 | ORAL_TABLET | Freq: Every day | ORAL | Status: DC
  Administered 2013-04-19 – 2013-04-20 (×2): 1 via ORAL
  Filled 2013-04-19 (×2): qty 1

## 2013-04-19 MED ORDER — NICOTINE 14 MG/24HR TD PT24
14.0000 mg | MEDICATED_PATCH | Freq: Every day | TRANSDERMAL | Status: DC
  Administered 2013-04-19 – 2013-04-21 (×3): 14 mg via TRANSDERMAL
  Filled 2013-04-19 (×4): qty 1

## 2013-04-19 MED ORDER — HYDROCODONE-ACETAMINOPHEN 5-325 MG PO TABS
1.0000 | ORAL_TABLET | ORAL | Status: DC | PRN
  Administered 2013-04-19 – 2013-04-20 (×7): 2 via ORAL
  Filled 2013-04-19 (×7): qty 2

## 2013-04-19 MED ORDER — INFLUENZA VAC SPLIT QUAD 0.5 ML IM SUSP
0.5000 mL | INTRAMUSCULAR | Status: AC
  Filled 2013-04-19: qty 0.5

## 2013-04-19 NOTE — Progress Notes (Signed)
Ur chart review completed.  

## 2013-04-19 NOTE — ED Notes (Signed)
Pt given medication for pain of 10/10 before transfer to [facility] hospital. Report given and pt transferred to women's.

## 2013-04-19 NOTE — ED Notes (Signed)
Pt to CT

## 2013-04-19 NOTE — H&P (Signed)
Chief Complaint   Patient presents with   .  Abdominal Pain   (Consider location/radiation/quality/duration/timing/severity/associated sxs/prior  Treatment)  HPI Comments: Patient is a 37 year old [id number] female with a history of C-section x 3 and D&C x 1 who presents for RLQ abdominal pain with onset this morning. Patient states that pain has been constant since onset and gradually worsening. Pain is sharp, aching, and throbbing and worse when lying supine and with palpation; pain has no alleviating factors. Patient endorses associated nausea without emesis. She denies fevers, chest pain or shortness of breath, diarrhea, melena or hematochezia, urinary symptoms, vaginal bleeding or discharge, and numbness or tingling. Patient is febrile on arrival 102.68F. She denies a history of cholecystectomy or appendectomy. Patient states that she had a miscarriage this past August which resulted in a D&C as well as a pelvic abscess which required draining and IV antibiotics. Patient denies any complications since hospital discharge. Patient was to follow up at Gyn clinic at [facility] after hospitalization but she missed her appointmentThe history is provided by the patient. Past Medical History   Diagnosis  Date   .  Medical history non-contributory    Tubo-ovarian abscess 02/2013 Past Surgical History   Procedure  Laterality  Date   .  Cesarean section     .  Dilation and evacuation  N/A  02/03/2013     Procedure: DILATATION AND EVACUATION; Surgeon: Adam Phenix, MD; Location: [facility]; Service: Gynecology; Laterality: N/A;   IR drainage of TOA 02/2013 History reviewed. No pertinent family history.  History   Substance Use Topics   .  Smoking status:  Current Every Day Smoker     Types:  Cigarettes   .  Smokeless tobacco:  Not on file   .  Alcohol Use:  No    OB History    Grav  Para  Term  Preterm  Abortions  TAB  SAB  Ect  Mult  Living    3  3         3      Review of Systems  Constitutional:  Negative for fever.  Respiratory: Negative for shortness of breath.  Cardiovascular: Negative for chest pain.  Gastrointestinal: Positive for nausea and abdominal pain. Negative for vomiting.  Genitourinary: Negative for dysuria, vaginal bleeding and vaginal discharge.  Neurological: Negative for weakness and numbness.  All other systems reviewed and are negative.  Allergies   Strawberry  Home Medications   No current outpatient prescriptions on file.  BP 121/74  Pulse 98  Temp(Src) 98.9 F (37.2 C) (Oral)  Resp 15  SpO2 99%  Physical Exam  Nursing note and vitals reviewed.  Constitutional: She is oriented to person, place, and time. She appears well-nourished. No distress.  HENT:  Head: Normocephalic and atraumatic.  Mouth/Throat: Oropharynx is clear and moist. No oropharyngeal exudate.  Eyes: Conjunctivae and EOM are normal. Pupils are equal, round, and reactive to light. No scleral icterus.  Neck: Normal range of motion.  Cardiovascular: Regular rhythm, normal heart sounds and intact distal pulses. Tachycardia present.  Pulmonary/Chest: Effort normal and breath sounds normal. No respiratory distress. She has no wheezes. She has no rales.  Abdominal: Soft. She exhibits no distension. There is tenderness. There is guarding. There is no rebound.  Mild peritoneal signs and involuntary guarding with focal tenderness to palpation in the right lower quadrant and suprapubic region. Patient in extreme pain when attempting to recline the head of the exam room bed to make patient supine;  patient cries out in extreme pain and becomes tearful, grabbing her RLQ.  Pelvic exam at [facility] discharge, mild CMT, STD probe done, right side tender, no mass palpable Musculoskeletal: Normal range of motion.  Neurological: She is alert and oriented to person, place, and time.  Skin: Skin is warm and dry. No rash noted. She is not diaphoretic. No erythema. No pallor.  Psychiatric: She has a normal mood  and affect. Her behavior is normal.  ED Course   Procedures (including critical care time)  Labs Review  Labs Reviewed   CBC WITH DIFFERENTIAL - Abnormal; Notable for the following:    WBC  16.0 (*)     Neutrophils Relative %  78 (*)     Neutro Abs  12.4 (*)     All other components within normal limits   COMPREHENSIVE METABOLIC PANEL - Abnormal; Notable for the following:    Glucose, Bld  108 (*)     All other components within normal limits   URINALYSIS, ROUTINE W REFLEX MICROSCOPIC - Abnormal; Notable for the following:    APPearance  CLOUDY (*)     Hgb urine dipstick  TRACE (*)     Leukocytes, UA  TRACE (*)     All other components within normal limits   CULTURE, BLOOD (ROUTINE X 2)   CULTURE, BLOOD (ROUTINE X 2)   URINE MICROSCOPIC-ADD ON   POCT PREGNANCY, URINE   CG4 I-STAT (LACTIC ACID)   Imaging Review  US Transvaginal Non-ob  04/19/2013 CLINICAL DATA: Abdominal pain. Rule out torsion. EXAM: TRANSABDOMINAL AND TRANSVAGINAL ULTRASOUND OF PELVIS DOPPLER ULTRASOUND OF OVARIES TECHNIQUE: Both transabdominal and transvaginal ultrasound examinations of the pelvis were performed. Transabdominal technique was performed for global imaging of the pelvis including uterus, ovaries, adnexal regions, and pelvic cul-de-sac. It was necessary to proceed with endovaginal exam following the transabdominal exam to visualize the adnexa. Color and duplex Doppler ultrasound was utilized to evaluate blood flow to the ovaries. COMPARISON: 02/03/2013 FINDINGS: Uterus Measurements: 13 x 4 x 8 cm. No fibroids or other mass visualized. Endometrium Thickness: Around 2 mm. No focal abnormality visualized. Right ovary Measurements: Complex appearing right adnexa, likely with fluid collection around the ovary, which in total measures 12 x 6 x 9 cm. The ovary, centrally within the fluid, has normal low resistance arterial and venous waveform. One of the spectral tracings of the right adnexa is high resistance  arterial, likely non ovarian. Left ovary Measurements: Uncertain size due to complex appearance of the left adnexa status post recent abscess drainage. There is an anechoic cyst with smaller internal cyst, measuring 6 cm. Spectral interrogation in the left adnexa has a low resistance arterial and venous waveform. Pulsed Doppler evaluation of both ovaries as described above. IMPRESSION: 1. Question progression/recurrence of pelvic inflammatory disease, with right tubo-ovarian complex or abscess. Infused abdominal CT suggested. 2. 6 cm left adnexal cyst which could represent an inclusion cyst or remnant from previously treated abscess. 3. Doubt ovarian torsion, although the pelvic inflammatory changes limited assessment. Electronically Signed By: Tiburcio Pea M.D. On: 04/19/2013 00:10  US Pelvis Complete  04/19/2013 CLINICAL DATA: Abdominal pain. Rule out torsion. EXAM: TRANSABDOMINAL AND TRANSVAGINAL ULTRASOUND OF PELVIS DOPPLER ULTRASOUND OF OVARIES TECHNIQUE: Both transabdominal and transvaginal ultrasound examinations of the pelvis were performed. Transabdominal technique was performed for global imaging of the pelvis including uterus, ovaries, adnexal regions, and pelvic cul-de-sac. It was necessary to proceed with endovaginal exam following the transabdominal exam to visualize the adnexa. Color and duplex  Doppler ultrasound was utilized to evaluate blood flow to the ovaries. COMPARISON: 02/03/2013 FINDINGS: Uterus Measurements: 13 x 4 x 8 cm. No fibroids or other mass visualized. Endometrium Thickness: Around 2 mm. No focal abnormality visualized. Right ovary Measurements: Complex appearing right adnexa, likely with fluid collection around the ovary, which in total measures 12 x 6 x 9 cm. The ovary, centrally within the fluid, has normal low resistance arterial and venous waveform. One of the spectral tracings of the right adnexa is high resistance arterial, likely non ovarian. Left ovary Measurements:  Uncertain size due to complex appearance of the left adnexa status post recent abscess drainage. There is an anechoic cyst with smaller internal cyst, measuring 6 cm. Spectral interrogation in the left adnexa has a low resistance arterial and venous waveform. Pulsed Doppler evaluation of both ovaries as described above. IMPRESSION: 1. Question progression/recurrence of pelvic inflammatory disease, with right tubo-ovarian complex or abscess. Infused abdominal CT suggested. 2. 6 cm left adnexal cyst which could represent an inclusion cyst or remnant from previously treated abscess. 3. Doubt ovarian torsion, although the pelvic inflammatory changes limited assessment. Electronically Signed By: Tiburcio Pea M.D. On: 04/19/2013 00:10  Ct Abdomen Pelvis W Contrast  04/19/2013 *RADIOLOGY REPORT* Clinical Data: Right lower quadrant pain, reported abscess on the pelvis reportedly drained. CT ABDOMEN AND PELVIS WITH CONTRAST Technique: Multidetector CT imaging of the abdomen and pelvis was performed following the standard protocol during bolus administration of intravenous contrast. Contrast: OMNIPAQUE IOHEXOL 300 MG/ML SOLN Comparison: Pelvic sonogram April 18, 2013, CT of abdomen February 12, 2013. Findings: Limited view of the lung bases are clear. The included heart and pericardium are unremarkable. Within the right pelvis, and expected location of the right adnexa is a 5.6 x 8.9 x 10.2 cm (transverse by AP by cc) rim enhancing complex fluid collection, without calcifications nor radiopaque foreign bodies or subcutaneous gas. There is extensive surrounding inflammatory changes and trace free fluid. Fluid collection corresponds to that seen on the pelvic sonogram April 18, 2013. Posterior to the fluid collection is the ill-defined soft tissue density within the right retroperitoneal measuring up to 2.5 cm suggesting phlegmon. In addition, low density 5.8 cm left adnexal cyst, previously characterized on  ultrasound as simple cyst. The liver, spleen, pancreas, gallbladder and adrenal glands are normal in size, morphology and enhancement characteristics. The stomach, small are normal in course and caliber without definite wall thickening or inflammatory changes, though there is mass effect on the inferior margin of the cecum due to the right adnexal mass. No free fluid nor free air. The kidneys are well-located, demonstrating normal morphology, size and enhancement without renal masses, nephrolithiasis or hydronephrosis. Ureters are unremarkable. Urinary bladder is partially distended and appears normal. Great vessels are normal in course and caliber. No lymphadenopathy by CT size criteria, small precaval lymph nodes. Internal reproductive organs appear normal. The included soft tissues and osseous structures are non-suspicious. IMPRESSION: 5.6 x 8.9 x 10.2 cm right pelvic rim enhancing fluid collection concerning for recurrent tubal ovarian abscess. This is similar to pelvic sonogram April 18, 2013 though, greatly increased in size from CT of abdomen and pelvis February 12, 2013. 6 cm presumed cyst in the left ovary. Original Report Authenticated By: Awilda Metro  [place] Art/ven Flow Abd Pelv Doppler  04/19/2013 CLINICAL DATA: Abdominal pain. Rule out torsion. EXAM: TRANSABDOMINAL AND TRANSVAGINAL ULTRASOUND OF PELVIS DOPPLER ULTRASOUND OF OVARIES TECHNIQUE: Both transabdominal and transvaginal ultrasound examinations of the pelvis were performed. Transabdominal technique was performed  for global imaging of the pelvis including uterus, ovaries, adnexal regions, and pelvic cul-de-sac. It was necessary to proceed with endovaginal exam following the transabdominal exam to visualize the adnexa. Color and duplex Doppler ultrasound was utilized to evaluate blood flow to the ovaries. COMPARISON: 02/03/2013 FINDINGS: Uterus Measurements: 13 x 4 x 8 cm. No fibroids or other mass visualized. Endometrium Thickness: Around 2  mm. No focal abnormality visualized. Right ovary Measurements: Complex appearing right adnexa, likely with fluid collection around the ovary, which in total measures 12 x 6 x 9 cm. The ovary, centrally within the fluid, has normal low resistance arterial and venous waveform. One of the spectral tracings of the right adnexa is high resistance arterial, likely non ovarian. Left ovary Measurements: Uncertain size due to complex appearance of the left adnexa status post recent abscess drainage. There is an anechoic cyst with smaller internal cyst, measuring 6 cm. Spectral interrogation in the left adnexa has a low resistance arterial and venous waveform. Pulsed Doppler evaluation of both ovaries as described above. IMPRESSION: 1. Question progression/recurrence of pelvic inflammatory disease, with right tubo-ovarian complex or abscess. Infused abdominal CT suggested. 2. 6 cm left adnexal cyst which could represent an inclusion cyst or remnant from previously treated abscess. 3. Doubt ovarian torsion, although the pelvic inflammatory changes limited assessment. Electronically Signed By: Tiburcio Pea M.D. On: 04/19/2013 00:10  Dg Chest Port 1 View  04/18/2013 CLINICAL DATA: Fever and abdominal pains. EXAM: PORTABLE CHEST - 1 VIEW COMPARISON: 02/04/2013 FINDINGS: Single view of the chest demonstrates clear lungs. Heart and mediastinum are within normal limits. The trachea is midline. Bony thorax is intact. IMPRESSION: No acute chest findings. Electronically Signed By: Richarda Overlie M.D. On: 04/18/2013 20:57  EKG Interpretation    None     CRITICAL CARE Performed by: Antony Madura  Total critical care time: 40  Critical care time was exclusive of separately billable procedures and treating other patients.  Critical care was necessary to treat or prevent imminent or life-threatening deterioration.  Critical care was time spent personally by me on the following activities: development of treatment plan with patient  and/or surrogate as well as nursing, discussions with consultants, evaluation of patient's response to treatment, examination of patient, obtaining history from patient or surrogate, ordering and performing treatments and interventions, ordering and review of laboratory studies, ordering and review of radiographic studies, pulse oximetry and re-evaluation of patient's condition.  MDM    1.  Right tubo-ovarian abscess   37 year old female presents for right lower quadrant abdominal pain with onset this morning. Patient meets sepsis criteria on arrival; febrile to 102.52F and tachy to 130 with a leukocytosis of 16.4. Labs otherwise stable. Patient exquisitely TTP in R mid abdomen with mild involuntary guarding and an intolerance to laying supine. Tachycardia and fever improved with IVF and tylenol. Tx in ED with IV vancomycin and zosyn. Labs stable.  CT scan significant for 10cm right TOA, also present on pelvic U/S; no evidence to suggest torsion. Patient has a hx of TOA and was d/c'd on 02/13/13 after surgical drainage. Have spoken with Dr. Erin Fulling on call for OBGYN. Patient transferred to [facility] for further care.  Filed Vitals:    04/18/13 2110  04/18/13 2115  04/18/13 2130  04/18/13 2145   BP:   107/79  117/73  121/74   Pulse:   114  98  98   Temp:  98.9 F (37.2 C)      TempSrc:  Resp:    14  15   SpO2:   100%  100%  99%   Adam Phenix, MD 11:06 AM 04/19/13

## 2013-04-19 NOTE — ED Provider Notes (Signed)
Medical screening examination/treatment/procedure(s) were conducted as a shared visit with non-physician practitioner(s) and myself.  I personally evaluated the patient during the encounter Pt presents w/ RLQ pain, meeting sepsis criteria w/ guarding on exam. She appears uncomfortable, but awake, alert.  Hx of pelvic abscess post-D&C in 8/'14. Concern for intraabdominal abscess vs appendicitis.  CT ab pelvis & pelvic US planned with appropriate surgical consult based on results.   Shanna Cisco, MD 04/19/13 [id number]

## 2013-04-20 DIAGNOSIS — N7093 Salpingitis and oophoritis, unspecified: Secondary | ICD-10-CM | POA: Diagnosis present

## 2013-04-20 DIAGNOSIS — N731 Chronic parametritis and pelvic cellulitis: Secondary | ICD-10-CM

## 2013-04-20 LAB — CBC WITH DIFFERENTIAL/PLATELET
Basophils Absolute: 0 10*3/uL (ref 0.0–0.1)
Eosinophils Relative: 1 % (ref 0–5)
Lymphocytes Relative: 10 % — ABNORMAL LOW (ref 12–46)
Lymphs Abs: 1.7 10*3/uL (ref 0.7–4.0)
MCV: 89.6 fL (ref 78.0–100.0)
Monocytes Relative: 6 % (ref 3–12)
Neutro Abs: 14.2 10*3/uL — ABNORMAL HIGH (ref 1.7–7.7)
Neutrophils Relative %: 84 % — ABNORMAL HIGH (ref 43–77)
Platelets: 224 10*3/uL (ref 150–400)
RBC: 3.45 MIL/uL — ABNORMAL LOW (ref 3.87–5.11)
RDW: 15.2 % (ref 11.5–15.5)
WBC: 17 10*3/uL — ABNORMAL HIGH (ref 4.0–10.5)

## 2013-04-20 LAB — GC/CHLAMYDIA PROBE AMP
CT Probe RNA: NEGATIVE
GC Probe RNA: NEGATIVE

## 2013-04-20 LAB — VANCOMYCIN, TROUGH: Vancomycin Tr: 10.9 ug/mL (ref 10.0–20.0)

## 2013-04-20 MED ORDER — IBUPROFEN 600 MG PO TABS
600.0000 mg | ORAL_TABLET | Freq: Four times a day (QID) | ORAL | Status: DC | PRN
  Administered 2013-04-20 – 2013-04-21 (×2): 600 mg via ORAL
  Filled 2013-04-20 (×2): qty 1

## 2013-04-20 MED ORDER — SIMETHICONE 80 MG PO CHEW
80.0000 mg | CHEWABLE_TABLET | Freq: Four times a day (QID) | ORAL | Status: DC | PRN
  Administered 2013-04-20: 80 mg via ORAL
  Filled 2013-04-20: qty 1

## 2013-04-20 MED ORDER — VANCOMYCIN HCL 10 G IV SOLR
1500.0000 mg | Freq: Three times a day (TID) | INTRAVENOUS | Status: DC
  Administered 2013-04-20 – 2013-04-21 (×3): 1500 mg via INTRAVENOUS
  Filled 2013-04-20 (×5): qty 1500

## 2013-04-20 MED ORDER — HYDROMORPHONE HCL 2 MG PO TABS
2.0000 mg | ORAL_TABLET | ORAL | Status: DC | PRN
  Administered 2013-04-20 – 2013-04-21 (×4): 2 mg via ORAL
  Filled 2013-04-20 (×4): qty 1

## 2013-04-20 NOTE — Progress Notes (Signed)
Upon RN shift assessment, pt sitting on side of bed crying and saying that her pain is a "10/10 when lying, and a 9/10 when moving." Pt stated her chest and stomach were hurting... Lungs auscultated and breath sounds were clear bilaterally. Vitals signs taken, BP: 138/96, HR: 98, O2 Sats: 99% on room air, RR: 20, and Temp: 98.5. Pt appeared to be in no distress except for crying... Pt stated she was "frustrated, and just wanted to go home." Dr. Macon Large notified of pt condition... Orders received... Will continue to monitor pt.

## 2013-04-20 NOTE — Progress Notes (Signed)
Patient left unit with her mother down elevator. Encouraged to stay but patient ambulated with IV pole off the unit without permission and stated that "she has been able to leave and walk previously."

## 2013-04-20 NOTE — Progress Notes (Signed)
Patient still complains of pain like "heartburn" and pains in her abdomen that feel like "gas" per patient. Dr. Lynetta Mare called since Toradol order has been completed. Dr. Lynetta Mare ordered new pain medication and mylicon for patient.

## 2013-04-20 NOTE — Progress Notes (Signed)
Patient sitting on side of bed eating chicken fingers and french fries. Patient states she is in pain radiating from her chest to her stomach. I palpated her abdomen and auscultated bowel sounds. Her abdomen was soft and had active bowel sounds. Patient requested that I call the Doctor to get something for this pain. Dr. Lynetta Mare called at [phone] and gave updates on patient. Ordered were received.

## 2013-04-20 NOTE — Progress Notes (Signed)
ANTIBIOTIC CONSULT NOTE - FOLLOW UP  Pharmacy Consult for Vancomycin Indication:  TOA  Allergies  Allergen Reactions  . Strawberry Swelling    Patient Measurements: Height: 5\' 1"  (154.9 cm) Weight: 185 lb (83.915 kg) IBW/kg (Calculated) : 47.8  Vital Signs: Temp: 99.1 F (37.3 C) (10/16 0600) Temp src: Oral (10/16 0600) BP: 116/68 mmHg (10/16 0600) Pulse Rate: 90 (10/16 0600) Intake/Output from previous day: 10/15 0701 - 10/16 0700 In: 1887.9 [P.O.:240; I.V.:1347.9; IV Piggyback:300] Out: 1000 [Urine:1000] Intake/Output from this shift:    Labs:  Recent Labs  04/18/13 1916 04/20/13 0540  WBC 16.0* 17.0*  HGB 13.3 10.3*  PLT 315 224  CREATININE 0.67  --    Estimated Creatinine Clearance: 96.4 ml/min (by C-G formula based on Cr of 0.67).  Recent Labs  04/20/13 0540  VANCOTROUGH 10.9      Anti-infectives   Start     Dose/Rate Route Frequency Ordered Stop   04/20/13 1400  vancomycin (VANCOCIN) 1,500 mg in sodium chloride 0.9 % 500 mL IVPB     1,500 mg 250 mL/hr over 120 Minutes Intravenous Every 8 hours 04/20/13 0855     04/19/13 0600  vancomycin (VANCOCIN) IVPB 1000 mg/200 mL premix  Status:  Discontinued     1,000 mg 200 mL/hr over 60 Minutes Intravenous Every 8 hours 04/18/13 2144 04/20/13 0856   04/19/13 0400  piperacillin-tazobactam (ZOSYN) IVPB 3.375 g     3.375 g 12.5 mL/hr over 240 Minutes Intravenous Every 8 hours 04/18/13 2144     04/18/13 2130  piperacillin-tazobactam (ZOSYN) IVPB 3.375 g     3.375 g 100 mL/hr over 30 Minutes Intravenous  Once 04/18/13 2122 04/19/13 0057   04/18/13 2130  vancomycin (VANCOCIN) IVPB 1000 mg/200 mL premix     1,000 mg 200 mL/hr over 60 Minutes Intravenous  Once 04/18/13 2122 04/19/13 0243      Assessment: 37 y.o presented to the ED with c/o right lower quadrant pain. In August of this year, she was hospitalized at [facility] for almost 2 weeks for Pelvic abscess/Tuboovarian abscess. Her abscesses were  drained on 8/06. During her hospital course she received cefotetan from 8/01-8/04, Doxycycline from 8/05-8/11, and zosyn from 8/04-8/11. She was then discharged on PO doxycycline and flagyl for an additional 10 days.  Started vancomycin and zosyn for empiric broad coverage this admission.  Pt with low grade fever today and c/o abdominal pain with pain scores 7-9.  Vancomycin trough was 10.9mg /L, so plan to increase dose to 1500 mg IV every 8 hours.  Maintain good hydration b/c patient received IV contrast and will be at increased risk for renal toxicity if she becomes dehydrated.  Will follow up [facility] on 10/17.    Goal of Therapy:  Vancomycin trough of 15 to 20 mg/L for better abscess penetration  Plan:  Increase Vancomycin to 1500 mg IV every 8 hours. Continue Zosyn regimen.  Berlin Hun D 04/20/2013,8:57 AM

## 2013-04-20 NOTE — Progress Notes (Signed)
37 yo G3P3 admitted on 04/18/13 for recurrent/worsening 12 cm right TOA.  Subjective: Patient reports having some lower abdominal pain. She is taking oral Toradol and Vicodin which help with her pain. She is tolerating regular diet, ambulating without difficulty. Her fever curve has gone down; last fever was 101.4 on 04/05/13 at 1100.   Objective: I have reviewed patient's vital signs, intake and output, medications, labs and radiology results.  Temp:  [97.7 F (36.5 C)-99.1 F (37.3 C)] 98.3 F (36.8 C) (10/16 1300) Pulse Rate:  [65-97] 90 (10/16 1300) Resp:  [16-18] 18 (10/16 1300) BP: (113-127)/(65-84) 121/84 mmHg (10/16 1300) SpO2:  [96 %-98 %] 98 % (10/16 1300)  General: alert and no distress Resp: clear to auscultation bilaterally Cardio: regular rate and rhythm GI: mild tenderness to palpation in lower abdomen, no reobound or guarding.  Extremities: extremities normal, atraumatic, no cyanosis or edema and Homans sign is negative, no sign of DVT  Labs and Imaging: Results for orders placed during the hospital encounter of 04/18/13 (from the past 24 hour(s))  CBC WITH DIFFERENTIAL     Status: Abnormal   Collection Time    04/20/13  5:40 AM      Result Value Range   WBC 17.0 (*) 4.0 - 10.5 K/uL   RBC 3.45 (*) 3.87 - 5.11 MIL/uL   Hemoglobin 10.3 (*) 12.0 - 15.0 g/dL   HCT 16.1 (*) 09.6 - 04.5 %   MCV 89.6  78.0 - 100.0 fL   MCH 29.9  26.0 - 34.0 pg   MCHC 33.3  30.0 - 36.0 g/dL   RDW 40.9  81.1 - 91.4 %   Platelets 224  150 - 400 K/uL   Neutrophils Relative % 84 (*) 43 - 77 %   Neutro Abs 14.2 (*) 1.7 - 7.7 K/uL   Lymphocytes Relative 10 (*) 12 - 46 %   Lymphs Abs 1.7  0.7 - 4.0 K/uL   Monocytes Relative 6  3 - 12 %   Monocytes Absolute 1.0  0.1 - 1.0 K/uL   Eosinophils Relative 1  0 - 5 %   Eosinophils Absolute 0.1  0.0 - 0.7 K/uL   Basophils Relative 0  0 - 1 %   Basophils Absolute 0.0  0.0 - 0.1 K/uL  VANCOMYCIN, TROUGH     Status: None   Collection Time   04/20/13  5:40 AM      Result Value Range   Vancomycin Tr 10.9  10.0 - 20.0 ug/mL    04/19/2013   TRANSABDOMINAL AND TRANSVAGINAL ULTRASOUND OF PELVIS  DOPPLER ULTRASOUND OF OVARIES  COMPARISON:  02/03/2013  FINDINGS: Uterus  Measurements: 13 x 4 x 8 cm. No fibroids or other mass visualized.  Endometrium  Thickness: Around 2 mm.  No focal abnormality visualized.  Right ovary  Measurements: Complex appearing right adnexa, likely with fluid collection around the ovary, which in total measures 12 x 6 x 9 cm. The ovary, centrally within the fluid, has normal low resistance arterial and venous waveform. One of the spectral tracings of the right adnexa is high resistance arterial, likely non ovarian.  Left ovary  Measurements: Uncertain size due to complex appearance of the left adnexa status post recent abscess drainage. There is an anechoic cyst with smaller internal cyst, measuring 6 cm. Spectral interrogation in the left adnexa has a low resistance arterial and venous waveform.  Pulsed Doppler evaluation of both ovaries as described above.  IMPRESSION: 1. Question progression/recurrence of pelvic inflammatory disease,  with right tubo-ovarian complex or abscess. Infused abdominal CT suggested. 2. 6 cm left adnexal cyst which could represent an inclusion cyst or remnant from previously treated abscess. 3. Doubt ovarian torsion, although the pelvic inflammatory changes limited assessment.   Electronically Signed   By: Tiburcio Pea M.D.    04/19/2013   CT ABDOMEN AND PELVIS WITH CONTRAST Clinical Data: Right lower quadrant pain, reported abscess on the pelvis reportedly drained.  Comparison: Pelvic sonogram April 18, 2013, CT of abdomen February 12, 2013.  Findings: Limited view of the lung bases are clear.  The included heart and pericardium are unremarkable.  Within the right pelvis, and expected location of the right adnexa is a 5.6 x 8.9 x 10.2 cm (transverse by AP by cc) rim enhancing complex fluid  collection, without calcifications nor radiopaque foreign bodies or subcutaneous gas.  There is extensive surrounding inflammatory changes and trace free fluid.  Fluid collection corresponds to that seen on the pelvic sonogram April 18, 2013. Posterior to the fluid collection is the ill-defined soft tissue density within the right retroperitoneal measuring up to 2.5 cm suggesting phlegmon.  In addition, low density 5.8 cm left adnexal cyst, previously characterized on ultrasound as simple cyst.  The liver, spleen, pancreas, gallbladder and adrenal glands are normal in size, morphology and enhancement characteristics.  The stomach, small are normal in course and caliber without definite wall thickening or inflammatory changes, though there is mass effect on the inferior margin of the cecum due to the right adnexal mass.  No free fluid nor free air.  The kidneys are well-located, demonstrating normal morphology, size and enhancement without renal masses, nephrolithiasis or hydronephrosis.  Ureters are unremarkable.  Urinary bladder is partially distended and appears normal.  Great vessels are normal in course and caliber.  No lymphadenopathy by CT size criteria, small precaval lymph nodes.  Internal reproductive organs appear normal.  The included soft tissues and osseous structures are non-suspicious.  IMPRESSION: 5.6 x 8.9 x 10.2 cm right pelvic rim enhancing fluid collection concerning for recurrent tubal ovarian abscess.  This is similar to pelvic sonogram April 18, 2013 though, greatly increased in size from CT of abdomen and pelvis February 12, 2013.  6 cm presumed cyst in the left ovary.   Original Report Authenticated By: Awilda Metro   04/18/2013   PORTABLE CHEST - 1 VIEW CLINICAL DATA:  Fever and abdominal pains.   COMPARISON:  02/04/2013  FINDINGS: Single view of the chest demonstrates clear lungs. Heart and mediastinum are within normal limits. The trachea is midline. Bony thorax is intact.   IMPRESSION: No acute chest findings.   Electronically Signed   By: Richarda Overlie M.D.   On: 04/18/2013 20:57   Assessment/Plan: Large R TOA, improving temperatures on Vancomycin and Zosyn - Continue antibiotics for now - Continue pain medications - Routine GYN care   LOS: 2 days   Jaynie Collins, MD, FACOG Attending Obstetrician & Gynecologist Faculty Practice, [facility]

## 2013-04-21 LAB — CREATININE, SERUM: GFR calc non Af Amer: >90 mL/min (ref >90)

## 2013-04-21 MED ORDER — FAMOTIDINE 20 MG PO TABS
20.0000 mg | ORAL_TABLET | Freq: Two times a day (BID) | ORAL | Status: DC
  Administered 2013-04-21: 20 mg via ORAL
  Filled 2013-04-21: qty 1

## 2013-04-21 MED ORDER — IBUPROFEN 600 MG PO TABS: 600.0000 mg | ORAL_TABLET | Freq: Four times a day (QID) | ORAL | Status: DC | PRN

## 2013-04-21 MED ORDER — AMOXICILLIN-POT CLAVULANATE ER 1000-62.5 MG PO TB12: 2.0000 | ORAL_TABLET | Freq: Two times a day (BID) | ORAL | Status: DC

## 2013-04-21 MED ORDER — HYDROMORPHONE HCL 2 MG PO TABS: 2.0000 mg | ORAL_TABLET | ORAL | Status: DC | PRN

## 2013-04-21 MED ORDER — DOCUSATE SODIUM 100 MG PO CAPS: 100.0000 mg | ORAL_CAPSULE | Freq: Two times a day (BID) | ORAL | Status: DC | PRN

## 2013-04-21 NOTE — Progress Notes (Signed)
Pt. Is discharged in the care of Mother. Downstairs per ambulatory with Nt. Stable. States she got good relief from pain med. Denies any further discomfort. Discharge instructions withRx were given to pt. Questions were answered and and asked. Aware of follow-up office appointments. States she understands.

## 2013-04-21 NOTE — Progress Notes (Signed)
Per Dr. Ladean Raya, patient to be given return to work letter with the date of May 06, 2013

## 2013-04-21 NOTE — Discharge Summary (Signed)
Physician Discharge Summary  Patient ID: Erica [name] MRN: [id number] DOB/AGE: 02/10/1976 37 y.o.  Admit date: 04/18/2013 Discharge date: 04/21/2013  Admission Diagnoses: Right tubo ovarian abscess  Discharge Diagnoses: same Principal Problem:   Right tubo-ovarian abscess Active Problems:   Pelvic abscess in female   Discharged Condition: good  Hospital Course: patient admitted with recurrent right TOA with abdominal pain and fever. Patient was treated with IV antibiotics and pain management. She remained afebrile for over 48 hours and was eager to be discharged on 10/17. Her pain is still present but manageable with oral pain medication. Patient ambulating and tolerating full meals. She is having bowel movements and voiding without difficulty.  Consults: None  Significant Diagnostic Studies: radiology: CT scan:   CT ABDOMEN AND PELVIS WITH CONTRAST  Technique: Multidetector CT imaging of the abdomen and pelvis was  performed following the standard protocol during bolus  administration of intravenous contrast.  Contrast: OMNIPAQUE IOHEXOL 300 MG/ML SOLN  Comparison: Pelvic sonogram April 18, 2013, CT of abdomen February 12, 2013.  Findings: Limited view of the lung bases are clear. The included  heart and pericardium are unremarkable.  Within the right pelvis, and expected location of the right adnexa  is a 5.6 x 8.9 x 10.2 cm (transverse by AP by cc) rim enhancing  complex fluid collection, without calcifications nor radiopaque  foreign bodies or subcutaneous gas. There is extensive surrounding  inflammatory changes and trace free fluid. Fluid collection  corresponds to that seen on the pelvic sonogram April 18, 2013.  Posterior to the fluid collection is the ill-defined soft tissue  density within the right retroperitoneal measuring up to 2.5 cm  suggesting phlegmon. In addition, low density 5.8 cm left adnexal  cyst, previously characterized on ultrasound as  simple cyst.  The liver, spleen, pancreas, gallbladder and adrenal glands are  normal in size, morphology and enhancement characteristics.  The stomach, small are normal in course and caliber without  definite wall thickening or inflammatory changes, though there is  mass effect on the inferior margin of the cecum due to the right  adnexal mass. No free fluid nor free air.  The kidneys are well-located, demonstrating normal morphology, size  and enhancement without renal masses, nephrolithiasis or  hydronephrosis. Ureters are unremarkable. Urinary bladder is  partially distended and appears normal.  Great vessels are normal in course and caliber. No lymphadenopathy  by CT size criteria, small precaval lymph nodes. Internal  reproductive organs appear normal. The included soft tissues and  osseous structures are non-suspicious.  IMPRESSION:  5.6 x 8.9 x 10.2 cm right pelvic rim enhancing fluid collection  concerning for recurrent tubal ovarian abscess. This is similar to  pelvic sonogram April 18, 2013 though, greatly increased in size  from CT of abdomen and pelvis February 12, 2013.  6 cm presumed cyst in the left ovary.  Original Report Authenticated By: Awilda Metro   Treatments: antibiotics: vancomycin and Zosyn and analgesia: Dilaudid  Discharge Exam: Blood pressure 132/86, pulse 105, temperature 99.4 F (37.4 C), temperature source Oral, resp. rate 16, height 5\' 1"  (1.549 m), weight 185 lb (83.915 kg), SpO2 100.00%. General appearance: alert, cooperative and no distress Resp: clear to auscultation bilaterally Cardio: regular rate and rhythm GI: soft, non distended and tenderness in right lower quadrant, no rebound, no guarding Extremities: Homans sign is negative, no sign of DVT and no edema, redness or tenderness in the calves or thighs  Disposition: 01-Home or Self Care  Medication List         amoxicillin-clavulanate 1000-62.5 MG per tablet  Commonly known  as:  AUGMENTIN XR  Take 2 tablets by mouth 2 (two) times daily.     docusate sodium 100 MG capsule  Commonly known as:  COLACE  Take 1 capsule (100 mg total) by mouth 2 (two) times daily as needed for constipation.     HYDROmorphone 2 MG tablet  Commonly known as:  DILAUDID  Take 1-2 tablets (2-4 mg total) by mouth every 3 (three) hours as needed.     ibuprofen 600 MG tablet  Commonly known as:  ADVIL,MOTRIN  Take 1 tablet (600 mg total) by mouth every 6 (six) hours as needed.           Follow-up Information   Follow up with [facility] On 05/05/2013. (at 10:15 am please arrive 15 minutes prior to appointment time)    Specialty:  Obstetrics and Gynecology   Contact information:   [address] [place] [address] [phone]      Signed: Alic Hilburn 04/21/2013, 9:24 AM

## 2013-04-25 LAB — CULTURE, BLOOD (ROUTINE X 2): Culture: NO GROWTH

## 2013-05-05 ENCOUNTER — Encounter: Payer: Self-pay | Admitting: Obstetrics & Gynecology

## 2013-05-05 ENCOUNTER — Ambulatory Visit (INDEPENDENT_AMBULATORY_CARE_PROVIDER_SITE_OTHER): Payer: Medicaid Other | Admitting: Obstetrics & Gynecology

## 2013-05-05 VITALS — BP 131/95 | HR 94 | Temp 97.6°F | Wt 182.3 lb

## 2013-05-05 DIAGNOSIS — N731 Chronic parametritis and pelvic cellulitis: Secondary | ICD-10-CM

## 2013-05-05 DIAGNOSIS — N739 Female pelvic inflammatory disease, unspecified: Secondary | ICD-10-CM

## 2013-05-05 NOTE — Patient Instructions (Signed)
Hysterectomy Information  A hysterectomy is a procedure where your uterus is surgically removed. It will no longer be possible to have menstrual periods or to become pregnant. The tubes and ovaries can be removed (bilateral salpingo-oopherectomy) during this surgery as well.  REASONS FOR A HYSTERECTOMY  Persistent, abnormal bleeding.  Lasting (chronic) pelvic pain or infection.  The lining of the uterus (endometrium) starts growing outside the uterus (endometriosis).  The endometrium starts growing in the muscle of the uterus (adenomyosis).  The uterus falls down into the vagina (pelvic organ prolapse).  Symptomatic uterine fibroids.  Precancerous cells.  Cervical cancer or uterine cancer. TYPES OF HYSTERECTOMIES  Supracervical hysterectomy. This type removes the top part of the uterus, but not the cervix.  Total hysterectomy. This type removes the uterus and cervix.  Radical hysterectomy. This type removes the uterus, cervix, and the fibrous tissue that holds the uterus in place in the pelvis (parametrium). WAYS A HYSTERECTOMY CAN BE PERFORMED  Abdominal hysterectomy. A large surgical cut (incision) is made in the abdomen. The uterus is removed through this incision.  Vaginal hysterectomy. An incision is made in the vagina. The uterus is removed through this incision. There are no abdominal incisions.  Conventional laparoscopic hysterectomy. A thin, lighted tube with a camera (laparoscope) is inserted into 3 or 4 small incisions in the abdomen. The uterus is cut into small pieces. The small pieces are removed through the incisions, or they are removed through the vagina.  Laparoscopic assisted vaginal hysterectomy (LAVH). Three or four small incisions are made in the abdomen. Part of the surgery is performed laparoscopically and part vaginally. The uterus is removed through the vagina.  Robot-assisted laparoscopic hysterectomy. A laparoscope is inserted into 3 or 4 small  incisions in the abdomen. A computer-controlled device is used to give the surgeon a 3D image. This allows for more precise movements of surgical instruments. The uterus is cut into small pieces and removed through the incisions or removed through the vagina. RISKS OF HYSTERECTOMY   Bleeding and risk of blood transfusion. Tell your caregiver if you do not want to receive any blood products.  Blood clots in the legs or lung.  Infection.  Injury to surrounding organs.  Anesthesia problems or side effects.  Conversion to an abdominal hysterectomy. WHAT TO EXPECT AFTER A HYSTERECTOMY  You will be given pain medicine.  You will need to have someone with you for the first 3 to 5 days after you go home.  You will need to follow up with your surgeon in 2 to 4 weeks after surgery to evaluate your progress.  You may have early menopause symptoms like hot flashes, night sweats, and insomnia.  If you had a hysterectomy for a problem that was not a cancer or a condition that could lead to cancer, then you no longer need Pap tests. However, even if you no longer need a Pap test, a regular exam is a good idea to make sure no other problems are starting. Document Released: 12/16/2000 Document Revised: 09/14/2011 Document Reviewed: 01/31/2011 Exit[employer] Patient Information 2014 [employer].  

## 2013-05-05 NOTE — Progress Notes (Signed)
Patient ID: Erica [name], female   DOB: 02/16/1976, 37 y.o.   MRN: [id number]  Chief Complaint  Patient presents with  . Follow-up    from [facility], talk about hysterectomy     HPI Erica [name] is a 37 y.o. female.  LMP 10/31. Pain is better but some GI discomfort after eating, no diarrhea. Recently readmitted with recurrence of R TOA. HPI  Past Medical History  Diagnosis Date  . Medical history non-contributory     Past Surgical History  Procedure Laterality Date  . Cesarean section    . Dilation and evacuation N/A 02/03/2013    Procedure: DILATATION AND EVACUATION;  Surgeon: Adam Phenix, MD;  Location: [facility];  Service: Gynecology;  Laterality: N/A;    History reviewed. No pertinent family history.  Social History History  Substance Use Topics  . Smoking status: Current Every Day Smoker -- 0.50 packs/day    Types: Cigarettes  . Smokeless tobacco: Never Used  . Alcohol Use: No    Allergies  Allergen Reactions  . Strawberry Swelling    Current Outpatient Prescriptions  Medication Sig Dispense Refill  . HYDROmorphone (DILAUDID) 2 MG tablet Take 1-2 tablets (2-4 mg total) by mouth every 3 (three) hours as needed.  30 tablet  0  . ibuprofen (ADVIL,MOTRIN) 600 MG tablet Take 1 tablet (600 mg total) by mouth every 6 (six) hours as needed.  30 tablet  0  . docusate sodium (COLACE) 100 MG capsule Take 1 capsule (100 mg total) by mouth 2 (two) times daily as needed for constipation.  30 capsule  2   No current facility-administered medications for this visit.    Review of Systems Review of Systems  Constitutional: Negative for fever.  HENT: Positive for congestion and sore throat.   Gastrointestinal: Positive for abdominal pain. Negative for diarrhea.  Genitourinary: Positive for vaginal bleeding and pelvic pain.    Blood pressure 131/95, pulse 94, temperature 97.6 F (36.4 C), temperature source Oral, weight 182 lb 4.8 oz (82.691 kg), last menstrual period  03/22/2013.  Physical Exam Physical Exam  Constitutional: She is oriented to person, place, and time. She appears well-developed. No distress.  Abdominal: Soft. She exhibits no distension. There is no tenderness.  Genitourinary: Vagina normal and uterus normal.  No mass pap done  Neurological: She is alert and oriented to person, place, and time.  Skin: Skin is warm and dry.    Data Reviewed  *RADIOLOGY REPORT*  Clinical Data: Right lower quadrant pain, reported abscess on the  pelvis reportedly drained.  CT ABDOMEN AND PELVIS WITH CONTRAST  Technique: Multidetector CT imaging of the abdomen and pelvis was  performed following the standard protocol during bolus  administration of intravenous contrast.  Contrast: OMNIPAQUE IOHEXOL 300 MG/ML SOLN  Comparison: Pelvic sonogram April 18, 2013, CT of abdomen February 12, 2013.  Findings: Limited view of the lung bases are clear. The included  heart and pericardium are unremarkable.  Within the right pelvis, and expected location of the right adnexa  is a 5.6 x 8.9 x 10.2 cm (transverse by AP by cc) rim enhancing  complex fluid collection, without calcifications nor radiopaque  foreign bodies or subcutaneous gas. There is extensive surrounding  inflammatory changes and trace free fluid. Fluid collection  corresponds to that seen on the pelvic sonogram April 18, 2013.  Posterior to the fluid collection is the ill-defined soft tissue  density within the right retroperitoneal measuring up to 2.5 cm  suggesting phlegmon. In  addition, low density 5.8 cm left adnexal  cyst, previously characterized on ultrasound as simple cyst.  The liver, spleen, pancreas, gallbladder and adrenal glands are  normal in size, morphology and enhancement characteristics.  The stomach, small are normal in course and caliber without  definite wall thickening or inflammatory changes, though there is  mass effect on the inferior margin of the cecum due  to the right  adnexal mass. No free fluid nor free air.  The kidneys are well-located, demonstrating normal morphology, size  and enhancement without renal masses, nephrolithiasis or  hydronephrosis. Ureters are unremarkable. Urinary bladder is  partially distended and appears normal.  Great vessels are normal in course and caliber. No lymphadenopathy  by CT size criteria, small precaval lymph nodes. Internal  reproductive organs appear normal. The included soft tissues and  osseous structures are non-suspicious.  IMPRESSION:  5.6 x 8.9 x 10.2 cm right pelvic rim enhancing fluid collection  concerning for recurrent tubal ovarian abscess. This is similar to  pelvic sonogram April 18, 2013 though, greatly increased in size  from CT of abdomen and pelvis February 12, 2013.  6 cm presumed cyst in the left ovary.  Original Report Authenticated By: Awilda Metro  CBC    Component Value Date/Time   WBC 17.0* 04/20/2013 0540   RBC 3.45* 04/20/2013 0540   HGB 10.3* 04/20/2013 0540   HCT 30.9* 04/20/2013 0540   PLT 224 04/20/2013 0540   MCV 89.6 04/20/2013 0540   MCH 29.9 04/20/2013 0540   MCHC 33.3 04/20/2013 0540   RDW 15.2 04/20/2013 0540   LYMPHSABS 1.7 04/20/2013 0540   MONOABS 1.0 04/20/2013 0540   EOSABS 0.1 04/20/2013 0540   BASOSABS 0.0 04/20/2013 0540     Assessment    Chronic PID, abscess     Plan    Offered TAH/BSO for definitive therapy which she accepts. The risk of anesthesia, bleeding, infection, visceral organ damage were discussed and her questions were answered. She is trying to get Medicaid.        Quinlynn Cuthbert,Mcvicar 05/05/2013, 11:58 AM

## 2013-05-17 ENCOUNTER — Other Ambulatory Visit: Payer: Self-pay | Admitting: Obstetrics & Gynecology

## 2013-06-19 ENCOUNTER — Inpatient Hospital Stay: Admit: 2013-06-19 | Payer: Self-pay | Admitting: Obstetrics & Gynecology

## 2013-06-19 SURGERY — HYSTERECTOMY, ABDOMINAL
Anesthesia: Choice | Site: Abdomen | Laterality: Bilateral

## 2013-06-22 ENCOUNTER — Encounter (HOSPITAL_COMMUNITY): Payer: Self-pay

## 2013-06-23 ENCOUNTER — Other Ambulatory Visit (HOSPITAL_COMMUNITY): Payer: Self-pay

## 2013-06-26 ENCOUNTER — Encounter (HOSPITAL_COMMUNITY)
Admission: RE | Admit: 2013-06-26 | Discharge: 2013-06-26 | Disposition: A | Discharge status: Home / Self Care | Payer: Medicaid Other | Source: Ambulatory Visit | Attending: Obstetrics & Gynecology | Admitting: Obstetrics & Gynecology

## 2013-06-26 ENCOUNTER — Encounter (HOSPITAL_COMMUNITY): Payer: Self-pay

## 2013-06-26 DIAGNOSIS — Z01812 Encounter for preprocedural laboratory examination: Secondary | ICD-10-CM | POA: Insufficient documentation

## 2013-06-26 LAB — CBC
HCT: 38 % (ref 36.0–46.0)
Hemoglobin: 12.7 g/dL (ref 12.0–15.0)
MCH: 29.8 pg (ref 26.0–34.0)
MCHC: 33.4 g/dL (ref 30.0–36.0)
RBC: 4.26 MIL/uL (ref 3.87–5.11)
WBC: 7.5 10*3/uL (ref 4.0–10.5)

## 2013-06-26 NOTE — Patient Instructions (Signed)
Your procedure is scheduled on: 07/07/2013  Enter through the Main Entrance of [facility] at: 0600AM  Pick up the phone at the desk and dial [phone].  Call this number if you have problems the morning of surgery: [phone].  Remember: Do NOT eat food: AFTER MIDNIGHT THURSDAY Do NOT drink clear liquids after: AFTER MIDNIGHT THURSDAY Take these medicines the morning of surgery with a SIP OF WATER: NONE  Do NOT wear jewelry (body piercing), make-up, or nail polish. Do NOT wear lotions, powders, or perfumes.  You may wear deoderant. Do NOT shave for 48 hours prior to surgery. Do NOT bring valuables to the hospital. Contacts, dentures, or bridgework may not be worn into surgery. Leave suitcase in car.  After surgery it may be brought to your room.  For patients admitted to the hospital, checkout time is 11:00 AM the day of discharge.

## 2013-07-06 ENCOUNTER — Encounter (HOSPITAL_COMMUNITY): Payer: Self-pay | Admitting: Anesthesiology

## 2013-07-06 NOTE — Anesthesia Preprocedure Evaluation (Addendum)
Anesthesia Evaluation  Patient identified by MRN, date of birth, ID band Patient awake    Reviewed: Allergy & Precautions, H&P , NPO status , Patient's Chart, lab work & pertinent test results  Airway Mallampati: I TM Distance: >3 FB Neck ROM: Full    Dental no notable dental hx. (+) Teeth Intact   Pulmonary Current Smoker,  breath sounds clear to auscultation  Pulmonary exam normal       Cardiovascular negative cardio ROS  Rhythm:Regular Rate:Normal     Neuro/Psych negative neurological ROS  negative psych ROS   GI/Hepatic negative GI ROS, Neg liver ROS,   Endo/Other  Obesity  Renal/GU negative Renal ROS  negative genitourinary   Musculoskeletal negative musculoskeletal ROS (+)   Abdominal (+) + obese,   Peds  Hematology negative hematology ROS (+)   Anesthesia Other Findings   Reproductive/Obstetrics Chronic PID Right TOA                        Anesthesia Physical Anesthesia Plan  ASA: II  Anesthesia Plan: General   Post-op Pain Management:    Induction: Intravenous  Airway Management Planned: Oral ETT  Additional Equipment:   Intra-op Plan:   Post-operative Plan: Extubation in OR  Informed Consent: I have reviewed the patients History and Physical, chart, labs and discussed the procedure including the risks, benefits and alternatives for the proposed anesthesia with the patient or authorized representative who has indicated his/her understanding and acceptance.   Dental advisory given  Plan Discussed with: CRNA, Anesthesiologist and Surgeon  Anesthesia Plan Comments:        Anesthesia Quick Evaluation

## 2013-07-07 ENCOUNTER — Encounter (HOSPITAL_COMMUNITY): Payer: Medicaid Other | Admitting: Anesthesiology

## 2013-07-07 ENCOUNTER — Encounter (HOSPITAL_COMMUNITY)
Admission: RE | Disposition: A | Discharge status: Home / Self Care | Payer: Self-pay | Source: Ambulatory Visit | Attending: Obstetrics & Gynecology

## 2013-07-07 ENCOUNTER — Inpatient Hospital Stay (HOSPITAL_COMMUNITY)
Admission: RE | Admit: 2013-07-07 | Discharge: 2013-07-09 | DRG: 743 | Disposition: A | Discharge status: Home / Self Care | Payer: Medicaid Other | Source: Ambulatory Visit | Attending: Obstetrics & Gynecology | Admitting: Obstetrics & Gynecology

## 2013-07-07 ENCOUNTER — Encounter (HOSPITAL_COMMUNITY): Payer: Self-pay | Admitting: Anesthesiology

## 2013-07-07 ENCOUNTER — Inpatient Hospital Stay (HOSPITAL_COMMUNITY): Payer: Medicaid Other | Admitting: Anesthesiology

## 2013-07-07 DIAGNOSIS — D251 Intramural leiomyoma of uterus: Secondary | ICD-10-CM | POA: Diagnosis present

## 2013-07-07 DIAGNOSIS — N731 Chronic parametritis and pelvic cellulitis: Principal | ICD-10-CM | POA: Diagnosis present

## 2013-07-07 DIAGNOSIS — N8300 Follicular cyst of ovary, unspecified side: Secondary | ICD-10-CM

## 2013-07-07 DIAGNOSIS — D250 Submucous leiomyoma of uterus: Secondary | ICD-10-CM | POA: Diagnosis present

## 2013-07-07 DIAGNOSIS — F17200 Nicotine dependence, unspecified, uncomplicated: Secondary | ICD-10-CM | POA: Diagnosis present

## 2013-07-07 HISTORY — PX: ABDOMINAL HYSTERECTOMY: SHX81

## 2013-07-07 LAB — PREGNANCY, URINE: PREG TEST UR: NEGATIVE

## 2013-07-07 SURGERY — HYSTERECTOMY, ABDOMINAL
Anesthesia: General | Site: Abdomen

## 2013-07-07 MED ORDER — DEXAMETHASONE SODIUM PHOSPHATE 10 MG/ML IJ SOLN
INTRAMUSCULAR | Status: AC
  Filled 2013-07-07: qty 1

## 2013-07-07 MED ORDER — KETOROLAC TROMETHAMINE 30 MG/ML IJ SOLN
INTRAMUSCULAR | Status: AC
  Filled 2013-07-07: qty 1

## 2013-07-07 MED ORDER — LACTATED RINGERS IV SOLN
INTRAVENOUS | Status: DC
  Administered 2013-07-07: 06:00:00 via INTRAVENOUS

## 2013-07-07 MED ORDER — NICOTINE 14 MG/24HR TD PT24
14.0000 mg | MEDICATED_PATCH | Freq: Every day | TRANSDERMAL | Status: DC
  Administered 2013-07-07 – 2013-07-08 (×2): 14 mg via TRANSDERMAL
  Filled 2013-07-07 (×3): qty 1

## 2013-07-07 MED ORDER — HYDROMORPHONE 0.3 MG/ML IV SOLN
INTRAVENOUS | Status: DC
  Administered 2013-07-07: 0.5 mg via INTRAVENOUS
  Administered 2013-07-07: 0.6 mg via INTRAVENOUS
  Administered 2013-07-07: 0.5 mg via INTRAVENOUS
  Administered 2013-07-07: 3.1 mg via INTRAVENOUS
  Administered 2013-07-07: 2.1 mg via INTRAVENOUS
  Administered 2013-07-07 – 2013-07-08 (×2): via INTRAVENOUS
  Administered 2013-07-08: 0.6 mg via INTRAVENOUS
  Administered 2013-07-08: 0.805 mg via INTRAVENOUS
  Filled 2013-07-07 (×2): qty 25

## 2013-07-07 MED ORDER — KETOROLAC TROMETHAMINE 30 MG/ML IJ SOLN
30.0000 mg | Freq: Four times a day (QID) | INTRAMUSCULAR | Status: DC
  Administered 2013-07-07 – 2013-07-08 (×3): 30 mg via INTRAVENOUS
  Filled 2013-07-07 (×3): qty 1

## 2013-07-07 MED ORDER — FENTANYL CITRATE 0.05 MG/ML IJ SOLN
INTRAMUSCULAR | Status: AC
  Filled 2013-07-07: qty 5

## 2013-07-07 MED ORDER — MEPERIDINE HCL 25 MG/ML IJ SOLN: 6.2500 mg | INTRAMUSCULAR | Status: DC | PRN

## 2013-07-07 MED ORDER — FENTANYL CITRATE 0.05 MG/ML IJ SOLN
INTRAMUSCULAR | Status: AC
  Filled 2013-07-07: qty 2

## 2013-07-07 MED ORDER — FENTANYL CITRATE 0.05 MG/ML IJ SOLN
25.0000 ug | INTRAMUSCULAR | Status: DC | PRN
  Administered 2013-07-07 (×3): 50 ug via INTRAVENOUS

## 2013-07-07 MED ORDER — GLYCOPYRROLATE 0.2 MG/ML IJ SOLN
INTRAMUSCULAR | Status: DC | PRN
  Administered 2013-07-07: 0.6 mg via INTRAVENOUS

## 2013-07-07 MED ORDER — NEOSTIGMINE METHYLSULFATE 1 MG/ML IJ SOLN
INTRAMUSCULAR | Status: DC | PRN
  Administered 2013-07-07: 3 mg via INTRAVENOUS

## 2013-07-07 MED ORDER — LACTATED RINGERS IV SOLN
INTRAVENOUS | Status: DC
  Administered 2013-07-07 (×3): via INTRAVENOUS

## 2013-07-07 MED ORDER — DIPHENHYDRAMINE HCL 50 MG/ML IJ SOLN: 12.5000 mg | Freq: Four times a day (QID) | INTRAMUSCULAR | Status: DC | PRN

## 2013-07-07 MED ORDER — NEOSTIGMINE METHYLSULFATE 1 MG/ML IJ SOLN
INTRAMUSCULAR | Status: AC
  Filled 2013-07-07: qty 1

## 2013-07-07 MED ORDER — ONDANSETRON HCL 4 MG/2ML IJ SOLN: 4.0000 mg | Freq: Four times a day (QID) | INTRAMUSCULAR | Status: DC | PRN

## 2013-07-07 MED ORDER — LACTATED RINGERS IV SOLN
INTRAVENOUS | Status: DC
  Administered 2013-07-07 – 2013-07-08 (×3): via INTRAVENOUS

## 2013-07-07 MED ORDER — PROPOFOL 10 MG/ML IV EMUL
INTRAVENOUS | Status: AC
  Filled 2013-07-07: qty 20

## 2013-07-07 MED ORDER — SODIUM CHLORIDE 0.9 % IJ SOLN: 9.0000 mL | INTRAMUSCULAR | Status: DC | PRN

## 2013-07-07 MED ORDER — FENTANYL CITRATE 0.05 MG/ML IJ SOLN
INTRAMUSCULAR | Status: AC
  Administered 2013-07-07: 50 ug via INTRAVENOUS
  Filled 2013-07-07: qty 2

## 2013-07-07 MED ORDER — GLYCOPYRROLATE 0.2 MG/ML IJ SOLN
INTRAMUSCULAR | Status: AC
  Filled 2013-07-07: qty 3

## 2013-07-07 MED ORDER — LORAZEPAM 2 MG/ML IJ SOLN
1.0000 mg | Freq: Four times a day (QID) | INTRAMUSCULAR | Status: DC | PRN
  Administered 2013-07-07: 1 mg via INTRAVENOUS
  Filled 2013-07-07: qty 1

## 2013-07-07 MED ORDER — NALOXONE HCL 0.4 MG/ML IJ SOLN
0.4000 mg | INTRAMUSCULAR | Status: DC | PRN
Start: 2013-07-07 — End: 2013-07-08

## 2013-07-07 MED ORDER — HYDROMORPHONE HCL PF 1 MG/ML IJ SOLN
1.0000 mg | Freq: Once | INTRAMUSCULAR | Status: AC
  Administered 2013-07-07: 1 mg via INTRAVENOUS

## 2013-07-07 MED ORDER — CEFAZOLIN SODIUM-DEXTROSE 2-3 GM-% IV SOLR
2.0000 g | INTRAVENOUS | Status: AC
  Administered 2013-07-07: 2 g via INTRAVENOUS

## 2013-07-07 MED ORDER — HYDROMORPHONE HCL PF 1 MG/ML IJ SOLN
INTRAMUSCULAR | Status: DC | PRN
  Administered 2013-07-07 (×2): 0.5 mg via INTRAVENOUS

## 2013-07-07 MED ORDER — ROCURONIUM BROMIDE 100 MG/10ML IV SOLN
INTRAVENOUS | Status: AC
  Filled 2013-07-07: qty 1

## 2013-07-07 MED ORDER — DIPHENHYDRAMINE HCL 12.5 MG/5ML PO ELIX: 12.5000 mg | ORAL_SOLUTION | Freq: Four times a day (QID) | ORAL | Status: DC | PRN

## 2013-07-07 MED ORDER — BUPIVACAINE HCL (PF) 0.5 % IJ SOLN
INTRAMUSCULAR | Status: AC
  Filled 2013-07-07: qty 30

## 2013-07-07 MED ORDER — FENTANYL CITRATE 0.05 MG/ML IJ SOLN
INTRAMUSCULAR | Status: DC | PRN
Start: 2013-07-07 — End: 2013-07-07
  Administered 2013-07-07: 50 ug via INTRAVENOUS
  Administered 2013-07-07: 100 ug via INTRAVENOUS
  Administered 2013-07-07: 50 ug via INTRAVENOUS
  Administered 2013-07-07: 150 ug via INTRAVENOUS
  Administered 2013-07-07 (×2): 50 ug via INTRAVENOUS

## 2013-07-07 MED ORDER — KETOROLAC TROMETHAMINE 30 MG/ML IJ SOLN
INTRAMUSCULAR | Status: DC | PRN
  Administered 2013-07-07: 30 mg via INTRAVENOUS

## 2013-07-07 MED ORDER — DEXAMETHASONE SODIUM PHOSPHATE 10 MG/ML IJ SOLN
INTRAMUSCULAR | Status: DC | PRN
  Administered 2013-07-07: 10 mg via INTRAVENOUS

## 2013-07-07 MED ORDER — ONDANSETRON HCL 4 MG/2ML IJ SOLN
INTRAMUSCULAR | Status: DC | PRN
  Administered 2013-07-07: 4 mg via INTRAVENOUS

## 2013-07-07 MED ORDER — HYDROMORPHONE HCL PF 1 MG/ML IJ SOLN
INTRAMUSCULAR | Status: AC
  Filled 2013-07-07: qty 1

## 2013-07-07 MED ORDER — OXYCODONE-ACETAMINOPHEN 5-325 MG PO TABS: 1.0000 | ORAL_TABLET | ORAL | Status: DC | PRN

## 2013-07-07 MED ORDER — ONDANSETRON HCL 4 MG/2ML IJ SOLN
INTRAMUSCULAR | Status: AC
  Filled 2013-07-07: qty 2

## 2013-07-07 MED ORDER — LIDOCAINE HCL (CARDIAC) 20 MG/ML IV SOLN
INTRAVENOUS | Status: AC
  Filled 2013-07-07: qty 5

## 2013-07-07 MED ORDER — METOCLOPRAMIDE HCL 5 MG/ML IJ SOLN: 10.0000 mg | Freq: Once | INTRAMUSCULAR | Status: DC | PRN

## 2013-07-07 MED ORDER — KETOROLAC TROMETHAMINE 30 MG/ML IJ SOLN
30.0000 mg | Freq: Four times a day (QID) | INTRAMUSCULAR | Status: DC
  Administered 2013-07-08 – 2013-07-09 (×4): 30 mg via INTRAMUSCULAR
  Filled 2013-07-07 (×4): qty 1

## 2013-07-07 MED ORDER — TEMAZEPAM 15 MG PO CAPS
15.0000 mg | ORAL_CAPSULE | Freq: Every evening | ORAL | Status: DC | PRN
Start: 2013-07-07 — End: 2013-07-09

## 2013-07-07 MED ORDER — MIDAZOLAM HCL 2 MG/2ML IJ SOLN
INTRAMUSCULAR | Status: DC | PRN
  Administered 2013-07-07: 2 mg via INTRAVENOUS

## 2013-07-07 MED ORDER — PROPOFOL 10 MG/ML IV BOLUS
INTRAVENOUS | Status: DC | PRN
  Administered 2013-07-07: 200 mg via INTRAVENOUS

## 2013-07-07 MED ORDER — ONDANSETRON HCL 4 MG PO TABS: 4.0000 mg | ORAL_TABLET | Freq: Four times a day (QID) | ORAL | Status: DC | PRN

## 2013-07-07 MED ORDER — MIDAZOLAM HCL 2 MG/2ML IJ SOLN
INTRAMUSCULAR | Status: AC
  Filled 2013-07-07: qty 2

## 2013-07-07 MED ORDER — ROCURONIUM BROMIDE 100 MG/10ML IV SOLN
INTRAVENOUS | Status: DC | PRN
  Administered 2013-07-07: 50 mg via INTRAVENOUS
  Administered 2013-07-07: 10 mg via INTRAVENOUS

## 2013-07-07 MED ORDER — LIDOCAINE HCL (CARDIAC) 20 MG/ML IV SOLN
INTRAVENOUS | Status: DC | PRN
  Administered 2013-07-07: 30 mg via INTRAVENOUS

## 2013-07-07 SURGICAL SUPPLY — 36 items
CANISTER SUCT 3000ML (MISCELLANEOUS) ×2 IMPLANT
CHLORAPREP W/TINT 26ML (MISCELLANEOUS) ×4 IMPLANT
CLOTH BEACON ORANGE TIMEOUT ST (SAFETY) ×2 IMPLANT
CONT PATH 16OZ SNAP LID 3702 (MISCELLANEOUS) ×2 IMPLANT
DISSECTOR SPONGE CHERRY (GAUZE/BANDAGES/DRESSINGS) ×2 IMPLANT
DRAPE WARM FLUID 44X44 (DRAPE) IMPLANT
DRSG OPSITE POSTOP 4X10 (GAUZE/BANDAGES/DRESSINGS) ×2 IMPLANT
GAUZE SPONGE 4X4 16PLY XRAY LF (GAUZE/BANDAGES/DRESSINGS) ×2 IMPLANT
GLOVE BIO SURGEON STRL SZ 6.5 (GLOVE) ×2 IMPLANT
GLOVE BIO SURGEON STRL SZ7 (GLOVE) ×2 IMPLANT
GLOVE BIOGEL PI IND STRL 7.0 (GLOVE) ×4 IMPLANT
GLOVE BIOGEL PI INDICATOR 7.0 (GLOVE) ×4
GOWN PREVENTION PLUS LG XLONG (DISPOSABLE) ×6 IMPLANT
HEMOSTAT SURGICEL 4X8 (HEMOSTASIS) ×2 IMPLANT
NEEDLE HYPO 22GX1.5 SAFETY (NEEDLE) ×2 IMPLANT
NS IRRIG 1000ML POUR BTL (IV SOLUTION) ×2 IMPLANT
PACK ABDOMINAL GYN (CUSTOM PROCEDURE TRAY) ×2 IMPLANT
PAD ABD 7.5X8 STRL (GAUZE/BANDAGES/DRESSINGS) ×2 IMPLANT
PAD OB MATERNITY 4.3X12.25 (PERSONAL CARE ITEMS) ×2 IMPLANT
PROTECTOR NERVE ULNAR (MISCELLANEOUS) ×2 IMPLANT
SPONGE LAP 18X18 X RAY DECT (DISPOSABLE) ×4 IMPLANT
STAPLER VISISTAT 35W (STAPLE) IMPLANT
SUT VIC AB 0 CT1 18XCR BRD8 (SUTURE) ×3 IMPLANT
SUT VIC AB 0 CT1 27 (SUTURE) ×1
SUT VIC AB 0 CT1 27XBRD ANBCTR (SUTURE) ×1 IMPLANT
SUT VIC AB 0 CT1 36 (SUTURE) ×4 IMPLANT
SUT VIC AB 0 CT1 8-18 (SUTURE) ×3
SUT VIC AB 2-0 CT1 27 (SUTURE) ×1
SUT VIC AB 2-0 CT1 TAPERPNT 27 (SUTURE) ×1 IMPLANT
SUT VIC AB 4-0 PS2 27 (SUTURE) IMPLANT
SUT VICRYL 0 TIES 12 18 (SUTURE) ×2 IMPLANT
SYR CONTROL 10ML LL (SYRINGE) ×2 IMPLANT
TAPE HYPAFIX 4 X10 (GAUZE/BANDAGES/DRESSINGS) ×2 IMPLANT
TOWEL OR 17X24 6PK STRL BLUE (TOWEL DISPOSABLE) ×6 IMPLANT
TRAY FOLEY CATH 14FR (SET/KITS/TRAYS/PACK) ×2 IMPLANT
WATER STERILE IRR 1000ML POUR (IV SOLUTION) IMPLANT

## 2013-07-07 NOTE — H&P (Signed)
Erica [name] is an 38 y.o. female. G3P3 No LMP recorded. LMP 06/17/13, admitted for TAH/BSO after 2 episodes of PID with TOA. At first admission she had IR drainage if abscess. No current pain or fever.  Pertinent Gynecological History: Menses: flow is moderate Bleeding: none Contraception: tubal ligation DES exposure: denies Blood transfusions: none Sexually transmitted diseases: PID Previous GYN Procedures: DNC and 3 CS   Last pap: normal Date: 05/05/13 OB History: G3P3    Menstrual History:  No LMP recorded.    Past Medical History  Diagnosis Date  . Medical history non-contributory     Past Surgical History  Procedure Laterality Date  . Cesarean section    . Dilation and evacuation N/A 02/03/2013    Procedure: DILATATION AND EVACUATION;  Surgeon: Woodroe Mode, MD;  Location: [facility];  Service: Gynecology;  Laterality: N/A;   IR drainage pelvic abscess 02/08/13 No family history on file.  Social History:  reports that she has been smoking Cigarettes.  She has a 7.5 pack-year smoking history. She has never used smokeless tobacco. She reports that she drinks alcohol. She reports that she does not use illicit drugs.  Allergies:  Allergies  Allergen Reactions  . Strawberry Swelling    Prescriptions prior to admission  Medication Sig Dispense Refill  . acetaminophen (TYLENOL) 500 MG tablet Take 1,000 mg by mouth daily as needed for mild pain or headache.        Review of Systems  Constitutional: Negative.   Respiratory: Negative.   Gastrointestinal: Negative.   Genitourinary: Negative.   Musculoskeletal: Negative.   Neurological: Negative for headaches.    Blood pressure 130/89, pulse 76, temperature 98.1 F (36.7 C), temperature source Oral, resp. rate 18. Physical Exam  Constitutional: She is oriented to person, place, and time. She appears well-developed. No distress.  HENT:  Head: Normocephalic.  Neck: Normal range of motion.  Cardiovascular: Normal  rate.   Respiratory: Effort normal. No respiratory distress.  GI: Soft. She exhibits no mass. There is no tenderness.  Transverse low abd scars  Musculoskeletal: Normal range of motion. She exhibits no edema.  Neurological: She is alert and oriented to person, place, and time.  Skin: Skin is warm and dry.  Psychiatric: She has a normal mood and affect. Her behavior is normal.    Results for orders placed during the hospital encounter of 07/07/13 (from the past 24 hour(s))  PREGNANCY, URINE     Status: None   Collection Time    07/07/13  6:00 AM      Result Value Range   Preg Test, Ur NEGATIVE  NEGATIVE   CBC    Component Value Date/Time   WBC 7.5 06/26/2013 1150   RBC 4.26 06/26/2013 1150   HGB 12.7 06/26/2013 1150   HCT 38.0 06/26/2013 1150   PLT 282 06/26/2013 1150   MCV 89.2 06/26/2013 1150   MCH 29.8 06/26/2013 1150   MCHC 33.4 06/26/2013 1150   RDW 15.1 06/26/2013 1150   LYMPHSABS 1.7 04/20/2013 0540   MONOABS 1.0 04/20/2013 0540   EOSABS 0.1 04/20/2013 0540   BASOSABS 0.0 04/20/2013 0540   *RADIOLOGY REPORT*  Clinical Data: Right lower quadrant pain, reported abscess on the  pelvis reportedly drained.  CT ABDOMEN AND PELVIS WITH CONTRAST  Technique: Multidetector CT imaging of the abdomen and pelvis was  performed following the standard protocol during bolus  administration of intravenous contrast.  Contrast: 115mL OMNIPAQUE IOHEXOL 300 MG/ML SOLN  Comparison: Pelvic sonogram April 18, 2013, CT of abdomen February 12, 2013.  Findings: Limited view of the lung bases are clear. The included  heart and pericardium are unremarkable.  Within the right pelvis, and expected location of the right adnexa  is a 5.6 x 8.9 x 10.2 cm (transverse by AP by cc) rim enhancing  complex fluid collection, without calcifications nor radiopaque  foreign bodies or subcutaneous gas. There is extensive surrounding  inflammatory changes and trace free fluid. Fluid collection   corresponds to that seen on the pelvic sonogram April 18, 2013.  Posterior to the fluid collection is the ill-defined soft tissue  density within the right retroperitoneal measuring up to 2.5 cm  suggesting phlegmon. In addition, low density 5.8 cm left adnexal  cyst, previously characterized on ultrasound as simple cyst.  The liver, spleen, pancreas, gallbladder and adrenal glands are  normal in size, morphology and enhancement characteristics.  The stomach, small are normal in course and caliber without  definite wall thickening or inflammatory changes, though there is  mass effect on the inferior margin of the cecum due to the right  adnexal mass. No free fluid nor free air.  The kidneys are well-located, demonstrating normal morphology, size  and enhancement without renal masses, nephrolithiasis or  hydronephrosis. Ureters are unremarkable. Urinary bladder is  partially distended and appears normal.  Great vessels are normal in course and caliber. No lymphadenopathy  by CT size criteria, small precaval lymph nodes. Internal  reproductive organs appear normal. The included soft tissues and  osseous structures are non-suspicious.  IMPRESSION:  5.6 x 8.9 x 10.2 cm right pelvic rim enhancing fluid collection  concerning for recurrent tubal ovarian abscess. This is similar to  pelvic sonogram April 18, 2013 though, greatly increased in size  from CT of abdomen and pelvis February 12, 2013.  6 cm presumed cyst in the left ovary.  Original Report Authenticated By: Elon Alas       Assessment/Plan: H/O pelvic abscess after 2 admission last year for TAH/BSO. Procedure and the risk of anesthesia, pain, bleeding, infection, bowel or urinary tract injury discussed and her questions were answered.   ARNOLD,Pedone 07/07/2013, 7:00 AM

## 2013-07-07 NOTE — Op Note (Signed)
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 Emeterio Reeve Assistant: Dr. Lavonia Drafts Anesthesia: Gen. endotracheal Blood loss: 350 mL Urine output: 100 mL IV fluids: 2000 mL of crystalloid Complications: None Drains: Foley catheter Specimen: Uterus tubes and ovaries Counts: Correct   Patient gave written consent for total bowel hysterectomy and bilateral salpingo-oophorectomy. She had 2 episodes of hospitalization for tubo-ovarian abscess. He wished to have definitive therapy for this with total abdominal hysterectomy. Patient identification was confirmed and she was brought to the OR and general anesthesia was induced. She's placed in dorsal supine position. Abdomen perineum and vagina were sterilely prepped and a Foley catheter was placed. After sterile draping a #10 blade was used to make a Pfannenstiel incision at the previous cesarean section scar. Incision was carried down to the fascia and the fascia was incised transversely. Fascia was separated from underlying scar and tissue attachments with blunt and sharp dissection. The bellies of the rectus muscles were separated midline by incising with Mayo scissors and the incision was extended vertically and the peritoneum was entered. A pack was placed to displace the bowel. The uterus was adhered to the anterior bowel wall. Was dense adhesion was easily taken down with cautery. Uterus was elevated using Lahey clamp. Kelly clamps were placed across the adnexa were excised. The ovaries appeared normal and there were minimal adhesions which were lysed with Metzenbaum scissors. There were no pelvic masses or cysts. The round ligaments were clamped and cut and suture ligated 0 Vicryl. The bladder was taken down anteriorly the round ligament was likewise taken down. The  infundibulopelvic ligaments were isolated and doubly clamped proximal to the adnexa. Pedicles were cut and suture ligated and good hemostasis was seen. The uterine vessels were exposed and clamped with Heaney clamps cut and suture ligated with 0 Vicryl. The cardinal ligaments and uterosacral ligaments were likewise clamped cut and suture ligated. Vaginal angles were clamped and cut and the vagina was entered and the specimen was excised with scissors and sent to pathology. The vaginal cuff was closed with a running locking suture with 0 Vicryl. Hemostasis was obtained at the right angle with a hemostatic suture. Some minimal oozing was seen and Surgicel was placed at the cuff with good hemostasis. All packs and instruments were removed. The self-retaining retractor was used during the procedure. The anterior peritoneum was closed with a running suture with 2-0 Vicryl. Fascia is closed with running suture with 0 Vicryl. Interrupted sutures with 0 Vicryl were placed in the subcutaneous layer. Good hemostasis was seen the incision incision was irrigated. Skin was closed with a running subcuticular suture with 4-0 Vicryl. Sterile dressing was applied. Patient tolerated the procedure well without crease. Estimated blood loss was 300 mL. She was brought in stable condition to PACU.   Dr. Emeterio Reeve 07/07/2013 9:58 AM

## 2013-07-07 NOTE — Anesthesia Postprocedure Evaluation (Signed)
Anesthesia Post Note  Patient: Erica [name]  Procedure(s) Performed: Procedure(s) (LRB): HYSTERECTOMY ABDOMINAL with bilateral salpingo-oophorectomy (N/A)  Anesthesia type: GA  Patient location: PACU  Post pain: Pain level controlled  Post assessment: Post-op Vital signs reviewed  Last Vitals:  Filed Vitals:   07/07/13 1045  BP:   Pulse:   Temp:   Resp: 16    Post vital signs: Reviewed  Level of consciousness: sedated  Complications: No apparent anesthesia complications

## 2013-07-07 NOTE — Transfer of Care (Signed)
Immediate Anesthesia Transfer of Care Note  Patient: Erica [name]  Procedure(s) Performed: Procedure(s): HYSTERECTOMY ABDOMINAL with bilateral salpingo-oophorectomy (N/A)  Patient Location: PACU  Anesthesia Type:General  Level of Consciousness: awake, oriented and patient cooperative  Airway & Oxygen Therapy: Patient Spontanous Breathing and Patient connected to nasal cannula oxygen  Post-op Assessment: Report given to PACU RN and Post -op Vital signs reviewed and stable  Post vital signs: Reviewed and stable  Complications: No apparent anesthesia complications

## 2013-07-07 NOTE — Anesthesia Postprocedure Evaluation (Signed)
Anesthesia Post Note  Patient: Erica [name]  Procedure(s) Performed: Procedure(s) (LRB): HYSTERECTOMY ABDOMINAL with bilateral salpingo-oophorectomy (N/A)  Anesthesia type: General  Patient location: [facility]  Post pain: Pain level controlled  Post assessment: Post-op Vital signs reviewed  Last Vitals:  Filed Vitals:   07/07/13 1814  BP:   Pulse:   Temp:   Resp: 11    Post vital signs: Reviewed  Level of consciousness: sedated  Complications: No apparent anesthesia complications

## 2013-07-08 LAB — CBC
HCT: 32.3 % — ABNORMAL LOW (ref 36.0–46.0)
HEMOGLOBIN: 10.7 g/dL — AB (ref 12.0–15.0)
MCH: 29.7 pg (ref 26.0–34.0)
MCHC: 33.1 g/dL (ref 30.0–36.0)
MCV: 89.7 fL (ref 78.0–100.0)
Platelets: 211 10*3/uL (ref 150–400)
RBC: 3.6 MIL/uL — ABNORMAL LOW (ref 3.87–5.11)
RDW: 15.6 % — ABNORMAL HIGH (ref 11.5–15.5)
WBC: 12.2 10*3/uL — ABNORMAL HIGH (ref 4.0–10.5)

## 2013-07-08 MED ORDER — LORAZEPAM 1 MG PO TABS
1.0000 mg | ORAL_TABLET | Freq: Four times a day (QID) | ORAL | Status: DC | PRN
  Administered 2013-07-08: 1 mg via ORAL
  Filled 2013-07-08: qty 1

## 2013-07-08 MED ORDER — IBUPROFEN 800 MG PO TABS: 800.0000 mg | ORAL_TABLET | Freq: Three times a day (TID) | ORAL | Status: DC | PRN

## 2013-07-08 MED ORDER — OXYCODONE-ACETAMINOPHEN 5-325 MG PO TABS: 1.0000 | ORAL_TABLET | Freq: Four times a day (QID) | ORAL | Status: DC | PRN

## 2013-07-08 MED ORDER — HYDROMORPHONE HCL 2 MG PO TABS
2.0000 mg | ORAL_TABLET | ORAL | Status: DC | PRN
  Administered 2013-07-08 – 2013-07-09 (×6): 2 mg via ORAL
  Filled 2013-07-08 (×7): qty 1

## 2013-07-08 NOTE — Progress Notes (Signed)
1 Day Post-Op Procedure(s) (LRB): HYSTERECTOMY ABDOMINAL with bilateral salpingo-oophorectomy (N/A)  Subjective: Patient reports incisional pain and tolerating PO.    Objective: I have reviewed patient's vital signs, intake and output, medications and labs.  General: alert, cooperative and no distress Resp: normal respiration GI: soft, non-tender; bowel sounds normal; no masses,  no organomegaly and incision: clean, dry and intact Extremities: extremities normal, atraumatic, no cyanosis or edema Vaginal Bleeding: minimal CBC    Component Value Date/Time   WBC 12.2* 07/08/2013 0515   RBC 3.60* 07/08/2013 0515   HGB 10.7* 07/08/2013 0515   HCT 32.3* 07/08/2013 0515   PLT 211 07/08/2013 0515   MCV 89.7 07/08/2013 0515   MCH 29.7 07/08/2013 0515   MCHC 33.1 07/08/2013 0515   RDW 15.6* 07/08/2013 0515   LYMPHSABS 1.7 04/20/2013 0540   MONOABS 1.0 04/20/2013 0540   EOSABS 0.1 04/20/2013 0540   BASOSABS 0.0 04/20/2013 0540     Assessment: s/p Procedure(s): HYSTERECTOMY ABDOMINAL with bilateral salpingo-oophorectomy (N/A): stable, progressing well and tolerating diet  Plan: Advance diet Encourage ambulation Advance to PO medication Discontinue IV fluids  LOS: 1 day    ARNOLD,Nall 07/08/2013, 8:52 AM

## 2013-07-08 NOTE — Discharge Instructions (Signed)
Hysterectomy °Care After °Refer to this sheet in the next few weeks. These instructions provide you with information on caring for yourself after your procedure. Your caregiver may also give you more specific instructions. Your treatment has been planned according to current medical practices, but problems sometimes occur. Call your caregiver if you have any problems or questions after your procedure. °HOME CARE INSTRUCTIONS  °Healing will take time. You may have discomfort, tenderness, swelling, and bruising at the surgical site for about 2 weeks. This is normal and will get better as time goes on. °· Only take over-the-counter or prescription medicines for pain, discomfort, or fever as directed by your caregiver. °· Do not take aspirin. It can cause bleeding. °· Do not drive when taking pain medicine. °· Follow your caregiver's advice regarding exercise, lifting, driving, and general activities. °· Resume your usual diet as directed and allowed. °· Get plenty of rest and sleep. °· Do not douche, use tampons, or have sexual intercourse for at least 6 weeks or until your caregiver gives you permission. °· Change your bandages (dressings) as directed by your caregiver. °· Monitor your temperature. °· Take showers instead of baths for 2 to 3 weeks. °· Do not drink alcohol until your caregiver gives you permission. °· If you are constipated, you may take a mild laxative with your caregiver's permission. Bran foods may help with constipation problems. Drinking enough fluids to keep your urine clear or pale yellow may help as well. °· Try to have someone home with you for 1 or 2 weeks to help around the house. °· Keep all of your follow-up appointments as directed by your caregiver. °SEEK MEDICAL CARE IF:  °· You have swelling, redness, or increasing pain in the surgical cut (incision) area. °· You have pus coming from the incision. °· You notice a bad smell coming from the incision or dressing. °· You have swelling,  redness, or pain around the intravenous (IV) site. °· Your incision breaks open. °· You feel dizzy or lightheaded. °· You have pain or bleeding when you urinate. °· You have persistent diarrhea. °· You have persistent nausea and vomiting. °· You have abnormal vaginal discharge. °· You have a rash. °· You have any type of abnormal reaction or develop an allergy to your medicine. °· Your pain is not controlled with your prescribed medicine. °SEEK IMMEDIATE MEDICAL CARE IF:  °· You have a fever. °· You have severe abdominal pain. °· You have chest pain. °· You have shortness of breath. °· You faint. °· You have pain, swelling, or redness of your leg. °· You have heavy vaginal bleeding with blood clots. °MAKE SURE YOU: °· Understand these instructions. °· Will watch your condition. °· Will get help right away if you are not doing well or get worse. °Document Released: 01/09/2005 Document Revised: 09/14/2011 Document Reviewed: 02/06/2011 °ExitCare® Patient Information ©2014 [employer]. ° °

## 2013-07-09 MED ORDER — HYDROMORPHONE HCL 2 MG PO TABS: 2.0000 mg | ORAL_TABLET | ORAL | Status: DC | PRN

## 2013-07-09 NOTE — Progress Notes (Signed)
Pressure dressing removed, honeycomb stuck to pressure dressing.  Steri strips and new honeycomb dressing applied.  Incision is free of any redness and well approximated.  Incision care discussed with patient, no questions at this time.  Follow up appointments, medications, activity, when to call the doctor and community resources discussed.  No questions at this time.  Patient left unit in stable condition with all personal belongings and prescriptions accompanied by staff.  Leighton Roach, RN----------

## 2013-07-09 NOTE — Plan of Care (Signed)
Problem: Phase III Progression Outcomes Goal: Ambulates without assistance Outcome: Completed/Met Date Met:  07/09/13 Walks in halls without difficulty and tolerates well

## 2013-07-09 NOTE — Discharge Summary (Signed)
Gynecology Physician Discharge Summary  Patient ID: Erica [name] MRN: [id number] DOB/AGE: 36-02-1976 36 y.o.  Admit date: 07/07/2013 Discharge date: 07/09/2013  Preoperative Diagnoses:  Patient Active Problem List   Diagnosis Date Noted  . Chronic PID (chronic pelvic inflammatory disease) 07/07/2013  . Right tubo-ovarian abscess 04/20/2013  . Pelvic abscess in female 02/08/2013    Procedures: Procedure(s) (LRB): HYSTERECTOMY ABDOMINAL with bilateral salpingo-oophorectomy (N/A)  Results for orders placed during the hospital encounter of 07/07/13 (from the past 336 hour(s))  PREGNANCY, URINE   Collection Time    07/07/13  6:00 AM      Result Value Range   Preg Test, Ur NEGATIVE  NEGATIVE  CBC   Collection Time    07/08/13  5:15 AM      Result Value Range   WBC 12.2 (*) 4.0 - 10.5 K/uL   RBC 3.60 (*) 3.87 - 5.11 MIL/uL   Hemoglobin 10.7 (*) 12.0 - 15.0 g/dL   HCT 32.3 (*) 36.0 - 46.0 %   MCV 89.7  78.0 - 100.0 fL   MCH 29.7  26.0 - 34.0 pg   MCHC 33.1  30.0 - 36.0 g/dL   RDW 15.6 (*) 11.5 - 15.5 %   Platelets 211  150 - 400 K/uL  Results for orders placed during the hospital encounter of 06/26/13 (from the past 336 hour(s))  CBC   Collection Time    06/26/13 11:50 AM      Result Value Range   WBC 7.5  4.0 - 10.5 K/uL   RBC 4.26  3.87 - 5.11 MIL/uL   Hemoglobin 12.7  12.0 - 15.0 g/dL   HCT 38.0  36.0 - 46.0 %   MCV 89.2  78.0 - 100.0 fL   MCH 29.8  26.0 - 34.0 pg   MCHC 33.4  30.0 - 36.0 g/dL   RDW 15.1  11.5 - 15.5 %   Platelets 282  150 - 400 K/uL    Hospital Course:  Erica [name] is a 38 y.o. G3P3  admitted for scheduled surgery.  She underwent the procedures as mentioned above, her operation was uncomplicated. For further details about surgery, please refer to the operative report. Patient had an uncomplicated postoperative course. By time of discharge on POD#2, her pain was controlled on oral pain medications; she was ambulating, voiding without difficulty,  tolerating regular diet and passing flatus. She was deemed stable for discharge to home.   Discharge Exam: Blood pressure 123/67, pulse 98, temperature 98.5 F (36.9 C), temperature source Oral, resp. rate 16, height 5\' 1"  (1.549 m), weight 185 lb (83.915 kg), SpO2 99.00%. General appearance: alert and no distress   GI: soft, appropriately tender; bowel sounds normal; no masses, no organomegaly.  Incisions: C/D/I, no erythema, no drainage noted Extremities: extremities normal, atraumatic, no cyanosis or edema and Homans sign is negative, no sign of DVT  Discharged Condition: Stable  Disposition: 01-Home or Self Care  Discharge Orders   Future Orders Complete By Expires   Call MD for:  persistant nausea and vomiting  As directed    Call MD for:  redness, tenderness, or signs of infection (pain, swelling, redness, odor or green/yellow discharge around incision site)  As directed    Call MD for:  severe uncontrolled pain  As directed    Call MD for:  temperature >100.4  As directed    Diet - low sodium heart healthy  As directed    Discharge wound care:  As directed  Comments:     Please remove dressing prior to discharge.  Keep area clean and dry.   Increase activity slowly  As directed    Lifting restrictions  As directed    Comments:     Nothing > 20 lbs x 6 wks   Sexual Activity Restrictions  As directed    Comments:     Nothing in the vagina x 6 wks       Medication List         acetaminophen 500 MG tablet  Commonly known as:  TYLENOL  Take 1,000 mg by mouth daily as needed for mild pain or headache.     ibuprofen 800 MG tablet  Commonly known as:  ADVIL,MOTRIN  Take 1 tablet (800 mg total) by mouth every 8 (eight) hours as needed.     oxyCODONE-acetaminophen 5-325 MG per tablet  Commonly known as:  PERCOCET/ROXICET  Take 1-2 tablets by mouth every 6 (six) hours as needed.           Follow-up Information   Follow up with [facility] In 4 weeks. (They will  call you with this appointment.)    Contact information:   [place] [address] [phone]       Signed:  Donnamae Jude 07/09/2013 7:44 AM

## 2013-07-10 ENCOUNTER — Encounter (HOSPITAL_COMMUNITY): Payer: Self-pay | Admitting: Obstetrics & Gynecology

## 2013-07-12 NOTE — Addendum Note (Signed)
Addendum created 07/12/13 1133 by Rudean Curt, MD   Modules edited: Anesthesia Responsible Staff

## 2013-07-17 ENCOUNTER — Encounter: Payer: Self-pay | Admitting: *Deleted

## 2013-07-31 ENCOUNTER — Encounter: Payer: Self-pay | Admitting: *Deleted

## 2013-07-31 ENCOUNTER — Telehealth: Payer: Self-pay | Admitting: General Practice

## 2013-07-31 MED ORDER — IBUPROFEN 800 MG PO TABS: 800.0000 mg | ORAL_TABLET | Freq: Three times a day (TID) | ORAL | Status: DC | PRN

## 2013-07-31 NOTE — Telephone Encounter (Signed)
Patient called and left message that she was wondering if she could come in today to get a note for her work stating she is out of work and had a hysterectomy on 1/2 and also is wondering if she can get a refill on her pain medication

## 2013-07-31 NOTE — Telephone Encounter (Signed)
Pt came to clinic at 1215 today stating that she had left a message for the nurse and not received a return call. She had hysterectomy on 1/2 and requested pain medicine refill of her narcotic dilaudid and also ibuprofen. She also wanted a letter for her job stating that she had surgery and would be out of work. I informed pt that she should not need narcotic medication at this time and if her pain is that severe, she may need further evaluation. (also, pt was taking Percocet, not dilaudid per chart review)  I offered to send refill for the ibuprofen and she accepted. I also composed letter for pt as requested. Pt voiced understanding of all information and instructions. She had no further requests.

## 2013-08-09 ENCOUNTER — Ambulatory Visit: Payer: Medicaid Other | Admitting: Obstetrics & Gynecology

## 2013-08-16 ENCOUNTER — Ambulatory Visit (INDEPENDENT_AMBULATORY_CARE_PROVIDER_SITE_OTHER): Payer: Medicaid Other | Admitting: Obstetrics & Gynecology

## 2013-08-16 ENCOUNTER — Encounter: Payer: Self-pay | Admitting: Obstetrics & Gynecology

## 2013-08-16 VITALS — BP 134/91 | HR 97 | Temp 97.8°F | Ht 61.0 in | Wt 181.1 lb

## 2013-08-16 DIAGNOSIS — E8941 Symptomatic postprocedural ovarian failure: Secondary | ICD-10-CM

## 2013-08-16 DIAGNOSIS — N898 Other specified noninflammatory disorders of vagina: Secondary | ICD-10-CM

## 2013-08-16 DIAGNOSIS — N958 Other specified menopausal and perimenopausal disorders: Secondary | ICD-10-CM

## 2013-08-16 DIAGNOSIS — Z09 Encounter for follow-up examination after completed treatment for conditions other than malignant neoplasm: Secondary | ICD-10-CM

## 2013-08-16 MED ORDER — ESTRADIOL 1 MG PO TABS: 1.0000 mg | ORAL_TABLET | Freq: Every day | ORAL | Status: DC

## 2013-08-16 NOTE — Progress Notes (Signed)
Patient ID: Erica [name], female   DOB: Nov 24, 1975, 38 y.o.   MRN: [id number] Subjective: 6 weeks postop TAH/BSO     Erica [name] is a 38 y.o. female who presents to the clinic 6 weeks status post TAH/BSOfor h/o pelvic abscess, pain. Eating a regular diet without difficulty. Bowel movements are normal. Pain is controlled without any medications.Slight incisional soreness and vaginal discharge. VMS notes  The following portions of the patient's history were reviewed and updated as appropriate: allergies, current medications, past family history, past medical history, past social history, past surgical history and problem list.  Review of Systems Pertinent items are noted in HPI.    Objective:    BP 134/91  Pulse 97  Temp(Src) 97.8 F (36.6 C) (Oral)  Ht 5\' 1"  (1.549 m)  Wt 181 lb 1.6 oz (82.146 kg)  BMI 34.24 kg/m2  LMP 06/17/2013 General:  alert, cooperative and no distress  Abdomen: soft, non-tender  Incision:   healing well, no drainage, no erythema, no hernia, no seroma, no swelling, no dehiscence, incision well approximated    yellow vaginal discharge and cuff intact,minimal tenderness at cuff, no mass Assessment:    Doing well postoperatively. Operative findings again reviewed. Pathology report discussed.   f/U on wet prep from today Plan:    1. Continue any current medications. 2. Wound care discussed. 3. Activity restrictions: none 4. Anticipated return to work: 1-2 weeks. 5. Follow up: 2 months to review progress on ERT, estrace 1 mg daily.  Woodroe Mode, MD 08/16/2013

## 2013-08-16 NOTE — Patient Instructions (Signed)

## 2013-08-17 LAB — WET PREP, GENITAL
Trich, Wet Prep: NONE SEEN
WBC, Wet Prep HPF POC: NONE SEEN
Yeast Wet Prep HPF POC: NONE SEEN

## 2013-08-18 MED ORDER — METRONIDAZOLE 500 MG PO TABS: 500.0000 mg | ORAL_TABLET | Freq: Two times a day (BID) | ORAL | Status: AC

## 2013-08-18 NOTE — Addendum Note (Signed)
Addended by: Woodroe Mode on: 08/18/2013 12:14 PM   Modules accepted: Orders

## 2013-08-21 ENCOUNTER — Telehealth: Payer: Self-pay

## 2013-08-21 NOTE — Telephone Encounter (Signed)
Message copied by Geanie Logan on Mon Aug 21, 2013 10:04 AM ------      Message from: Woodroe Mode      Created: Fri Aug 18, 2013 12:04 PM       Dx BV, Flagyl Rx sent ------

## 2013-08-21 NOTE — Telephone Encounter (Signed)
Called pt. And informed of results and medication waiting for her at pharmacy. Informed pt. This is not a STI, it is simply an overgrowth of good and bad bacteria. Pt. Verbalizes understanding and has no other questions or concerns.

## 2013-09-14 ENCOUNTER — Telehealth: Payer: Self-pay | Admitting: *Deleted

## 2013-09-14 DIAGNOSIS — N958 Other specified menopausal and perimenopausal disorders: Secondary | ICD-10-CM

## 2013-09-14 MED ORDER — EST ESTROGENS-METHYLTEST 1.25-2.5 MG PO TABS: 1.0000 | ORAL_TABLET | Freq: Every day | ORAL | Status: DC

## 2013-09-14 NOTE — Telephone Encounter (Signed)
Erica [name] called and left a message she had a hysterectomy and was prescribed estradiol.  Wants to know if there is anything else- states it is not working- still feeling hot, sweating.  States she also wants to talk to someone about feeling emotionally crazy- states she is crying, nervous and dosen't know if that is part of it.   Requests call asap

## 2013-09-14 NOTE — Telephone Encounter (Signed)
Discussed patient complaints and requests with Dr. Roselie Awkward- new rx for estratest sent to pharmacy. Called [facility]- she answered but could not hear me. Called back and got a message [facility] customer not available.

## 2013-09-14 NOTE — Telephone Encounter (Signed)
Discussed with Dr. Roselie Awkward, will prescribe different medicine

## 2013-09-15 MED ORDER — EST ESTROGENS-METHYLTEST 1.25-2.5 MG PO TABS
1.0000 | ORAL_TABLET | Freq: Every day | ORAL | Status: DC
Start: 2013-09-15 — End: 2014-01-05

## 2013-09-15 NOTE — Telephone Encounter (Signed)
Called Erica [name] and notified her Dr. Roselie Awkward has ordered a new medicine which I am calling in to pharmacy because it will not eprescribe.  We disccussed because she went thru sudden menopause with having hysterectomy and BSO that it is normal to have feelings of being emotional/ crying, etc but that should get better as her body adjusts and the medicine should help. She denies thoughts of hurting self. Informed her it should get better as her body adjusts, but if it doesn't call us back. [name] voices understanding.

## 2013-09-15 NOTE — Addendum Note (Signed)
Addended by: Samuel Germany on: 09/15/2013 12:17 PM   Modules accepted: Orders

## 2013-12-25 ENCOUNTER — Encounter: Payer: Self-pay | Admitting: *Deleted

## 2014-01-05 ENCOUNTER — Emergency Department (HOSPITAL_COMMUNITY)
Admission: EM | Admit: 2014-01-05 | Discharge: 2014-01-05 | Disposition: A | Discharge status: Home / Self Care | Payer: Medicaid Other | Attending: Emergency Medicine | Admitting: Emergency Medicine

## 2014-01-05 ENCOUNTER — Encounter (HOSPITAL_COMMUNITY): Payer: Self-pay | Admitting: Emergency Medicine

## 2014-01-05 DIAGNOSIS — F17200 Nicotine dependence, unspecified, uncomplicated: Secondary | ICD-10-CM | POA: Diagnosis not present

## 2014-01-05 DIAGNOSIS — Z202 Contact with and (suspected) exposure to infections with a predominantly sexual mode of transmission: Secondary | ICD-10-CM | POA: Insufficient documentation

## 2014-01-05 DIAGNOSIS — Z711 Person with feared health complaint in whom no diagnosis is made: Secondary | ICD-10-CM

## 2014-01-05 LAB — HIV ANTIBODY (ROUTINE TESTING W REFLEX): HIV 1&2 Ab, 4th Generation: NONREACTIVE

## 2014-01-05 LAB — RPR

## 2014-01-05 LAB — RAPID HIV SCREEN (WH-MAU): SUDS RAPID HIV SCREEN: NONREACTIVE

## 2014-01-05 MED ORDER — LIDOCAINE HCL (PF) 1 % IJ SOLN
INTRAMUSCULAR | Status: AC
  Administered 2014-01-05: 2 mL
  Filled 2014-01-05: qty 5

## 2014-01-05 MED ORDER — AZITHROMYCIN 250 MG PO TABS
1000.0000 mg | ORAL_TABLET | Freq: Once | ORAL | Status: AC
  Administered 2014-01-05: 1000 mg via ORAL
  Filled 2014-01-05: qty 4

## 2014-01-05 MED ORDER — CEFTRIAXONE SODIUM 250 MG IJ SOLR
250.0000 mg | Freq: Once | INTRAMUSCULAR | Status: AC
  Administered 2014-01-05: 250 mg via INTRAMUSCULAR
  Filled 2014-01-05: qty 250

## 2014-01-05 NOTE — ED Notes (Signed)
EDP Manly at bedside.

## 2014-01-05 NOTE — Discharge Instructions (Signed)
Emergency Department Screening Exam  A medical screening exam helps find the cause of your problem. This exam also helps determine if your problem requires treatment right away. Your exam has shown that you do not require immediate emergency treatment. It is safe for you to go to your caregiver's office or clinic for treatment. Plans may have been made for you to be seen by your regular caregiver today.  Patients must be treated in an emergency department regardless of their ability to pay. You can decide to stay and receive continued treatment in the emergency department. Some insurance plans may not cover the cost of this service. In some, but not all, states in the [place] the hospital and/or your caregiver might bill you directly for your evaluation and treatment in the emergency department.  If your condition worsens or changes in any way, return for re-evaluation or you may go to your caregiver's office or clinic for treatment.  Document Released: 09/18/2008 Document Revised: 09/14/2011 Document Reviewed: 09/18/2008  ExitCare® Patient Information ©2015 [employer]. This information is not intended to replace advice given to you by your health care provider. Make sure you discuss any questions you have with your health care provider.
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
## 2014-01-05 NOTE — ED Notes (Signed)
Pt states that her boyfriend states that he is HIV positive. Pt wants to be checked for STD's and HIV. Pt states she was with her boyfriend a month ago and prior to but she does not know when he found out about the HIV positive.

## 2014-04-06 ENCOUNTER — Emergency Department (HOSPITAL_COMMUNITY): Payer: Medicaid Other

## 2014-04-06 ENCOUNTER — Encounter (HOSPITAL_COMMUNITY): Payer: Self-pay | Admitting: Emergency Medicine

## 2014-04-06 ENCOUNTER — Emergency Department (HOSPITAL_COMMUNITY)
Admission: EM | Admit: 2014-04-06 | Discharge: 2014-04-06 | Disposition: A | Discharge status: Home / Self Care | Payer: Medicaid Other | Attending: Emergency Medicine | Admitting: Emergency Medicine

## 2014-04-06 DIAGNOSIS — S72432A Displaced fracture of medial condyle of left femur, initial encounter for closed fracture: Secondary | ICD-10-CM | POA: Insufficient documentation

## 2014-04-06 DIAGNOSIS — S3992XA Unspecified injury of lower back, initial encounter: Secondary | ICD-10-CM | POA: Diagnosis not present

## 2014-04-06 DIAGNOSIS — S72412A Displaced unspecified condyle fracture of lower end of left femur, initial encounter for closed fracture: Secondary | ICD-10-CM

## 2014-04-06 DIAGNOSIS — Y9389 Activity, other specified: Secondary | ICD-10-CM | POA: Insufficient documentation

## 2014-04-06 DIAGNOSIS — F419 Anxiety disorder, unspecified: Secondary | ICD-10-CM | POA: Diagnosis not present

## 2014-04-06 DIAGNOSIS — Y92410 Unspecified street and highway as the place of occurrence of the external cause: Secondary | ICD-10-CM | POA: Insufficient documentation

## 2014-04-06 DIAGNOSIS — Z720 Tobacco use: Secondary | ICD-10-CM | POA: Insufficient documentation

## 2014-04-06 DIAGNOSIS — M545 Low back pain, unspecified: Secondary | ICD-10-CM

## 2014-04-06 MED ORDER — NAPROXEN 500 MG PO TABS: 500.0000 mg | ORAL_TABLET | Freq: Two times a day (BID) | ORAL | Status: DC

## 2014-04-06 MED ORDER — METHOCARBAMOL 500 MG PO TABS: 500.0000 mg | ORAL_TABLET | Freq: Two times a day (BID) | ORAL | Status: DC | PRN

## 2014-04-06 MED ORDER — LORAZEPAM 2 MG/ML IJ SOLN
1.0000 mg | Freq: Once | INTRAMUSCULAR | Status: AC
  Administered 2014-04-06: 1 mg via INTRAMUSCULAR
  Filled 2014-04-06: qty 1

## 2014-04-06 NOTE — Progress Notes (Signed)
Orthopedic Tech Progress Note Patient Details:  Erica [name] Jun 29, 1976 [id number] Applied Velcro knee immobilizer to LLE.  Pulses, sensation, motion intact before and after application.  Capillary refill less than 2 seconds before and after application.  Fit crutches and taught pt. use of same. Ortho Devices Type of Ortho Device: Knee Immobilizer Ortho Device/Splint Location: LLE Ortho Device/Splint Interventions: Application   Darrol Poke 04/06/2014, 10:31 PM
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
## 2014-04-07 NOTE — ED Provider Notes (Signed)
Medical screening examination/treatment/procedure(s) were performed by non-physician practitioner and as supervising physician I was immediately available for consultation/collaboration.   EKG Interpretation None        Debby Freiberg, MD 04/07/14 [phone]

## 2014-04-24 ENCOUNTER — Ambulatory Visit: Payer: Medicaid Other | Attending: Surgery

## 2014-04-24 DIAGNOSIS — M25562 Pain in left knee: Secondary | ICD-10-CM | POA: Insufficient documentation

## 2014-04-24 DIAGNOSIS — Z5189 Encounter for other specified aftercare: Secondary | ICD-10-CM | POA: Diagnosis not present

## 2014-04-24 DIAGNOSIS — M545 Low back pain: Secondary | ICD-10-CM | POA: Diagnosis not present

## 2014-05-07 ENCOUNTER — Encounter (HOSPITAL_COMMUNITY): Payer: Self-pay | Admitting: Emergency Medicine

## 2014-07-03 ENCOUNTER — Encounter (HOSPITAL_COMMUNITY): Payer: Self-pay | Admitting: Emergency Medicine

## 2014-07-03 ENCOUNTER — Emergency Department (INDEPENDENT_AMBULATORY_CARE_PROVIDER_SITE_OTHER)
Admission: EM | Admit: 2014-07-03 | Discharge: 2014-07-03 | Disposition: A | Discharge status: Home / Self Care | Payer: Medicaid Other | Source: Home / Self Care | Attending: Family Medicine | Admitting: Family Medicine

## 2014-07-03 ENCOUNTER — Other Ambulatory Visit (HOSPITAL_COMMUNITY)
Admission: RE | Admit: 2014-07-03 | Discharge: 2014-07-03 | Disposition: A | Discharge status: Home / Self Care | Payer: Medicaid Other | Source: Ambulatory Visit | Attending: Family Medicine | Admitting: Family Medicine

## 2014-07-03 DIAGNOSIS — N760 Acute vaginitis: Secondary | ICD-10-CM | POA: Insufficient documentation

## 2014-07-03 DIAGNOSIS — Z113 Encounter for screening for infections with a predominantly sexual mode of transmission: Secondary | ICD-10-CM | POA: Diagnosis not present

## 2014-07-03 DIAGNOSIS — Z202 Contact with and (suspected) exposure to infections with a predominantly sexual mode of transmission: Secondary | ICD-10-CM

## 2014-07-03 LAB — POCT URINALYSIS DIP (DEVICE)
Bilirubin Urine: NEGATIVE
Glucose, UA: NEGATIVE mg/dL
Ketones, ur: NEGATIVE mg/dL
Leukocytes, UA: NEGATIVE
Nitrite: NEGATIVE
PH: 5 (ref 5.0–8.0)
PROTEIN: NEGATIVE mg/dL
Specific Gravity, Urine: <=1.005 (ref 1.005–1.030)
Urobilinogen, UA: 0.2 mg/dL (ref 0.0–1.0)

## 2014-07-03 LAB — CERVICOVAGINAL ANCILLARY ONLY
WET PREP (BD AFFIRM): POSITIVE — AB
Wet Prep (BD Affirm): NEGATIVE
Wet Prep (BD Affirm): POSITIVE — AB

## 2014-07-03 LAB — POCT PREGNANCY, URINE: Preg Test, Ur: NEGATIVE

## 2014-07-03 LAB — RPR

## 2014-07-03 LAB — HIV ANTIBODY (ROUTINE TESTING W REFLEX): HIV 1&2 Ab, 4th Generation: NONREACTIVE

## 2014-07-03 MED ORDER — CEFTRIAXONE SODIUM 250 MG IJ SOLR
250.0000 mg | Freq: Once | INTRAMUSCULAR | Status: AC
  Administered 2014-07-03: 250 mg via INTRAMUSCULAR

## 2014-07-03 MED ORDER — AZITHROMYCIN 250 MG PO TABS
1000.0000 mg | ORAL_TABLET | Freq: Once | ORAL | Status: AC
  Administered 2014-07-03: 1000 mg via ORAL

## 2014-07-03 MED ORDER — CEFTRIAXONE SODIUM 250 MG IJ SOLR
INTRAMUSCULAR | Status: AC
  Filled 2014-07-03: qty 250

## 2014-07-03 MED ORDER — AZITHROMYCIN 250 MG PO TABS
ORAL_TABLET | ORAL | Status: AC
Start: 2014-07-03 — End: 2014-07-03
  Filled 2014-07-03: qty 4

## 2014-07-03 MED ORDER — LIDOCAINE HCL (PF) 1 % IJ SOLN
INTRAMUSCULAR | Status: AC
  Filled 2014-07-03: qty 5

## 2014-07-03 NOTE — ED Notes (Signed)
Patient reports possible exposure to std last week, had unprotected sex and then partner suggested he may have std's and needing to be evaluated.  Patient reports burning labia, but denies any discharge
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
## 2014-07-03 NOTE — ED Notes (Signed)
Gardnerella and Trich pos., Chlamydia neg.   Message sent to Dr.Kindl. Roselyn Meier 07/03/2014

## 2014-07-03 NOTE — Discharge Instructions (Signed)
Sexually Transmitted Disease A sexually transmitted disease (STD) is a disease or infection that may be passed (transmitted) from person to person, usually during sexual activity. This may happen by way of saliva, semen, blood, vaginal mucus, or urine. Common STDs include:   Gonorrhea.   Chlamydia.   Syphilis.   HIV and AIDS.   Genital herpes.   Hepatitis B and C.   Trichomonas.   Human papillomavirus (HPV).   Pubic lice.   Scabies.  Mites.  Bacterial vaginosis. WHAT ARE CAUSES OF STDs? An STD may be caused by bacteria, a virus, or parasites. STDs are often transmitted during sexual activity if one person is infected. However, they may also be transmitted through nonsexual means. STDs may be transmitted after:   Sexual intercourse with an infected person.   Sharing sex toys with an infected person.   Sharing needles with an infected person or using unclean piercing or tattoo needles.  Having intimate contact with the genitals, mouth, or rectal areas of an infected person.   Exposure to infected fluids during birth. WHAT ARE THE SIGNS AND SYMPTOMS OF STDs? Different STDs have different symptoms. Some people may not have any symptoms. If symptoms are present, they may include:   Painful or bloody urination.   Pain in the pelvis, abdomen, vagina, anus, throat, or eyes.   A skin rash, itching, or irritation.  Growths, ulcerations, blisters, or sores in the genital and anal areas.  Abnormal vaginal discharge with or without bad odor.   Penile discharge in men.   Fever.   Pain or bleeding during sexual intercourse.   Swollen glands in the groin area.   Yellow skin and eyes (jaundice). This is seen with hepatitis.   Swollen testicles.  Infertility.  Sores and blisters in the mouth. HOW ARE STDs DIAGNOSED? To make a diagnosis, your health care provider may:   Take a medical history.   Perform a physical exam.   Take a sample of  any discharge to examine.  Swab the throat, cervix, opening to the penis, rectum, or vagina for testing.  Test a sample of your first morning urine.   Perform blood tests.   Perform a Pap test, if this applies.   Perform a colposcopy.   Perform a laparoscopy.  HOW ARE STDs TREATED? Treatment depends on the STD. Some STDs may be treated but not cured.   Chlamydia, gonorrhea, trichomonas, and syphilis can be cured with antibiotic medicine.   Genital herpes, hepatitis, and HIV can be treated, but not cured, with prescribed medicines. The medicines lessen symptoms.   Genital warts from HPV can be treated with medicine or by freezing, burning (electrocautery), or surgery. Warts may come back.   HPV cannot be cured with medicine or surgery. However, abnormal areas may be removed from the cervix, vagina, or vulva.   If your diagnosis is confirmed, your recent sexual partners need treatment. This is true even if they are symptom-free or have a negative culture or evaluation. They should not have sex until their health care providers say it is okay. HOW CAN I REDUCE MY RISK OF GETTING AN STD? Take these steps to reduce your risk of getting an STD:  Use latex condoms, dental dams, and water-soluble lubricants during sexual activity. Do not use petroleum jelly or oils.  Avoid having multiple sex partners.  Do not have sex with someone who has other sex partners.  Do not have sex with anyone you do not know or who is at   high risk for an STD.  Avoid risky sex practices that can break your skin.  Do not have sex if you have open sores on your mouth or skin.  Avoid drinking too much alcohol or taking illegal drugs. Alcohol and drugs can affect your judgment and put you in a vulnerable position.  Avoid engaging in oral and anal sex acts.  Get vaccinated for HPV and hepatitis. If you have not received these vaccines in the past, talk to your health care provider about whether one  or both might be right for you.   If you are at risk of being infected with HIV, it is recommended that you take a prescription medicine daily to prevent HIV infection. This is called pre-exposure prophylaxis (PrEP). You are considered at risk if:  You are a man who has sex with other men (MSM).  You are a heterosexual man or woman and are sexually active with more than one partner.  You take drugs by injection.  You are sexually active with a partner who has HIV.  Talk with your health care provider about whether you are at high risk of being infected with HIV. If you choose to begin PrEP, you should first be tested for HIV. You should then be tested every 3 months for as long as you are taking PrEP.  WHAT SHOULD I DO IF I THINK I HAVE AN STD?  See your health care provider.   Tell your sexual partner(s). They should be tested and treated for any STDs.  Do not have sex until your health care provider says it is okay. WHEN SHOULD I GET IMMEDIATE MEDICAL CARE? Contact your health care provider right away if:   You have severe abdominal pain.  You are a man and notice swelling or pain in your testicles.  You are a woman and notice swelling or pain in your vagina. Document Released: 09/12/2002 Document Revised: 06/27/2013 Document Reviewed: 01/10/2013 [facility] Patient Information 2015 [place], [place]. This information is not intended to replace advice given to you by your health care provider. Make sure you discuss any questions you have with your health care provider.  Safe Sex Safe sex is about reducing the risk of giving or getting a sexually transmitted disease (STD). STDs are spread through sexual contact involving the genitals, mouth, or rectum. Some STDs can be cured and others cannot. Safe sex can also prevent unintended pregnancies.  WHAT ARE SOME SAFE SEX PRACTICES?  Limit your sexual activity to only one partner who is having sex with only you.  Talk to your partner  about his or her past partners, past STDs, and drug use.  Use a condom every time you have sexual intercourse. This includes vaginal, oral, and anal sexual activity. Both females and males should wear condoms during oral sex. Only use latex or polyurethane condoms and water-based lubricants. Using petroleum-based lubricants or oils to lubricate a condom will weaken the condom and increase the chance that it will break. The condom should be in place from the beginning to the end of sexual activity. Wearing a condom reduces, but does not completely eliminate, your risk of getting or giving an STD. STDs can be spread by contact with infected body fluids and skin.  Get vaccinated for hepatitis B and HPV.  Avoid alcohol and recreational drugs, which can affect your judgment. You may forget to use a condom or participate in high-risk sex.  For females, avoid douching after sexual intercourse. Douching can spread an infection  farther into the reproductive tract.  Check your body for signs of sores, blisters, rashes, or unusual discharge. See your health care provider if you notice any of these signs.  Avoid sexual contact if you have symptoms of an infection or are being treated for an STD. If you or your partner has herpes, avoid sexual contact when blisters are present. Use condoms at all other times.  If you are at risk of being infected with HIV, it is recommended that you take a prescription medicine daily to prevent HIV infection. This is called pre-exposure prophylaxis (PrEP). You are considered at risk if:  You are a man who has sex with other men (MSM).  You are a heterosexual man or woman who is sexually active with more than one partner.  You take drugs by injection.  You are sexually active with a partner who has HIV.  Talk with your health care provider about whether you are at high risk of being infected with HIV. If you choose to begin PrEP, you should first be tested for HIV. You  should then be tested every 3 months for as long as you are taking PrEP.  See your health care provider for regular screenings, exams, and tests for other STDs. Before having sex with a new partner, each of you should be screened for STDs and should talk about the results with each other. WHAT ARE THE BENEFITS OF SAFE SEX?   There is less chance of getting or giving an STD.  You can prevent unwanted or unintended pregnancies.  By discussing safe sex concerns with your partner, you may increase feelings of intimacy, comfort, trust, and honesty between the two of you. Document Released: 07/30/2004 Document Revised: 11/06/2013 Document Reviewed: 12/14/2011 [facility] Patient Information 2015 [place], [place]. This information is not intended to replace advice given to you by your health care provider. Make sure you discuss any questions you have with your health care provider.

## 2014-07-03 NOTE — ED Notes (Signed)
Patient aware of post injection delay prior to discharge.   

## 2014-07-04 ENCOUNTER — Telehealth (HOSPITAL_COMMUNITY): Payer: Self-pay | Admitting: *Deleted

## 2014-07-04 LAB — CERVICOVAGINAL ANCILLARY ONLY
Chlamydia: NEGATIVE
Neisseria Gonorrhea: NEGATIVE

## 2014-07-04 NOTE — ED Notes (Signed)
GC/Chlamydia neg., Affirm: Candida neg., Gardnerella and Trich pos.  Message sent to Dr. Juventino Slovak. 07/03/2014 Discussed with Dr. Juventino Slovak and VO given for Flagyl 500 mg. PO BID x 7 days. I called pt. and left a message to call.  Call 1. 07/04/2014

## 2014-07-09 MED ORDER — METRONIDAZOLE 500 MG PO TABS: 500.0000 mg | ORAL_TABLET | Freq: Two times a day (BID) | ORAL | Status: DC

## 2014-07-09 NOTE — ED Notes (Signed)
After  Id     Checked  Pt      Given results  Of   Lab  Results  And   rx  Sent  To      [facility]     Per  Dr  Bridgett Larsson  Pt  Given   PLan of  Care  Instructions

## 2014-08-29 ENCOUNTER — Encounter: Payer: Self-pay | Admitting: *Deleted

## 2015-07-06 IMAGING — CR DG CHEST 1V PORT
1 series · 1 of 1 positions shown · non-contrast
Comparison: 02/04/2013

CLINICAL DATA: Fever and abdominal pains.

EXAM:
PORTABLE CHEST - 1 VIEW

[AP]
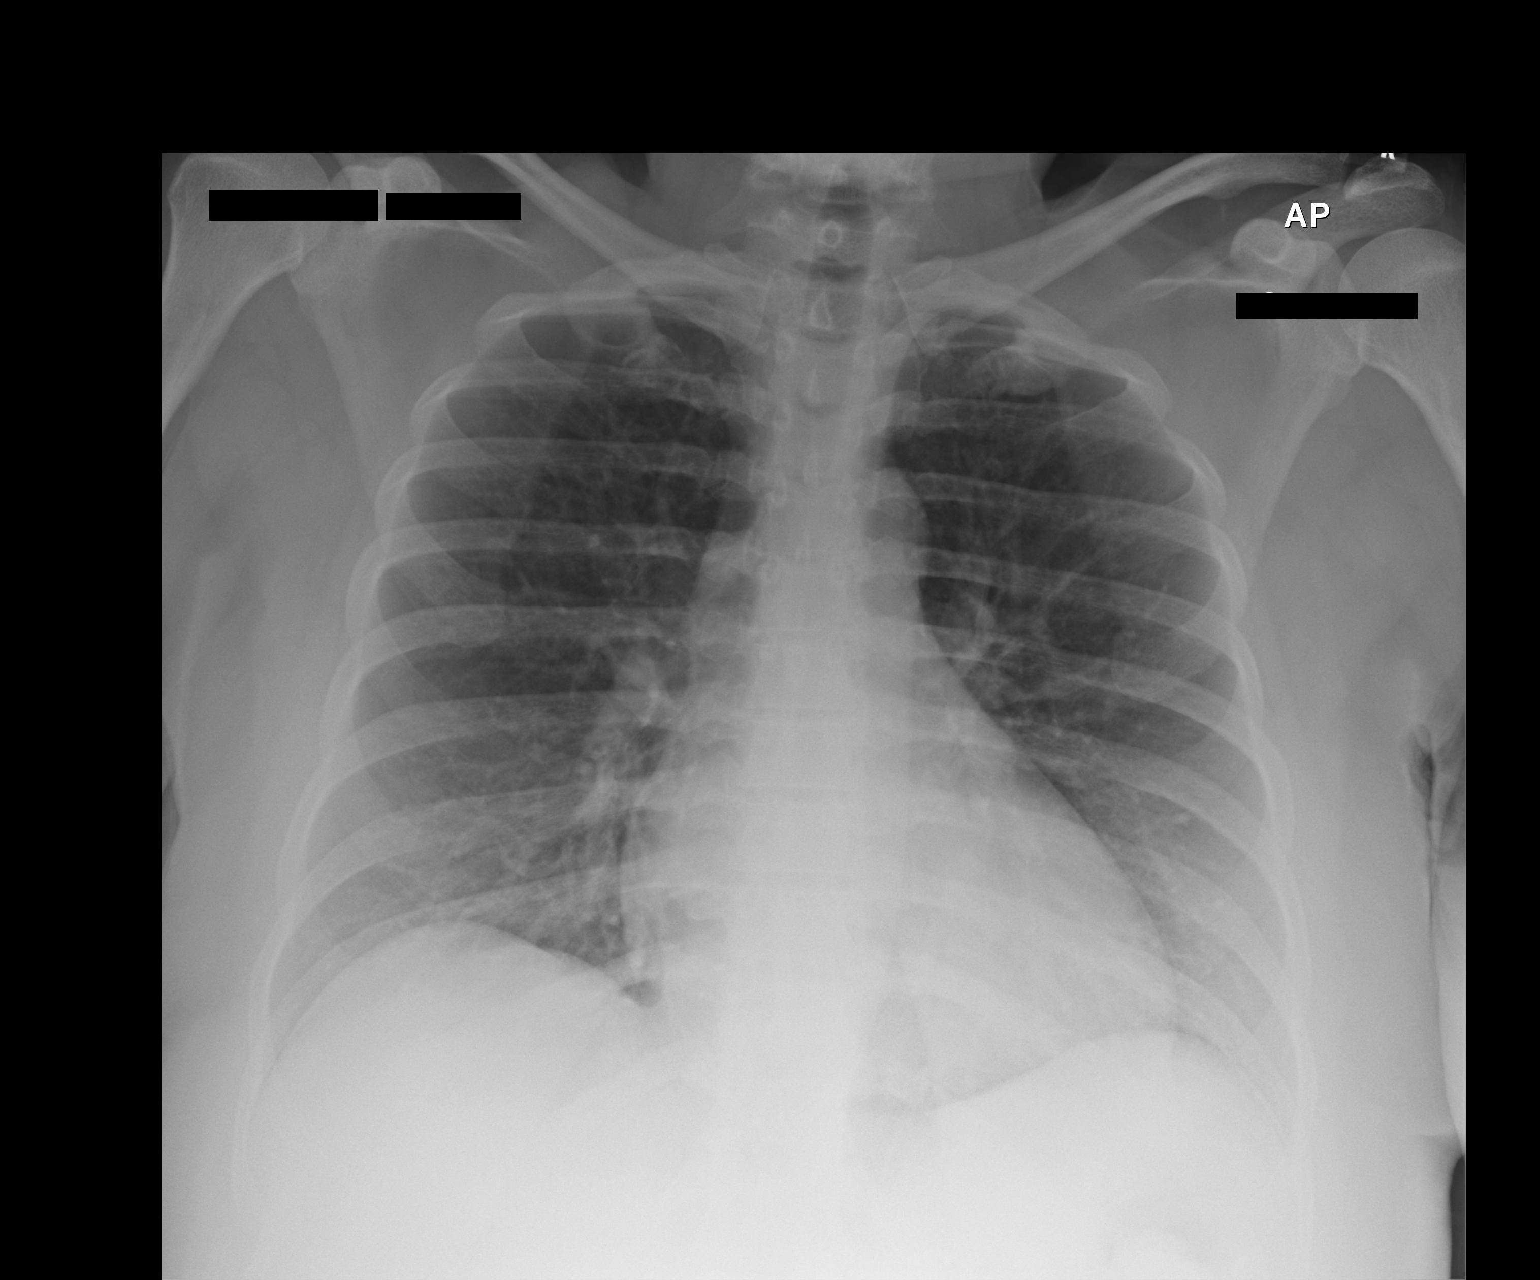

[1 of 1 positions shown; findings below may reference images not displayed]

FINDINGS: Single view of the chest demonstrates clear lungs. Heart and
mediastinum are within normal limits. The trachea is midline. Bony
thorax is intact.
IMPRESSION: No acute chest findings.

## 2015-08-11 ENCOUNTER — Emergency Department (HOSPITAL_COMMUNITY): Payer: Medicaid Other

## 2015-08-11 ENCOUNTER — Emergency Department (HOSPITAL_COMMUNITY)
Admission: EM | Admit: 2015-08-11 | Discharge: 2015-08-11 | Disposition: A | Discharge status: Home / Self Care | Payer: Medicaid Other | Attending: Emergency Medicine | Admitting: Emergency Medicine

## 2015-08-11 ENCOUNTER — Encounter (HOSPITAL_COMMUNITY): Payer: Self-pay | Admitting: Emergency Medicine

## 2015-08-11 DIAGNOSIS — F17210 Nicotine dependence, cigarettes, uncomplicated: Secondary | ICD-10-CM | POA: Insufficient documentation

## 2015-08-11 DIAGNOSIS — R0981 Nasal congestion: Secondary | ICD-10-CM | POA: Insufficient documentation

## 2015-08-11 DIAGNOSIS — R42 Dizziness and giddiness: Secondary | ICD-10-CM | POA: Diagnosis present

## 2015-08-11 DIAGNOSIS — J3489 Other specified disorders of nose and nasal sinuses: Secondary | ICD-10-CM | POA: Insufficient documentation

## 2015-08-11 DIAGNOSIS — H81399 Other peripheral vertigo, unspecified ear: Secondary | ICD-10-CM

## 2015-08-11 LAB — I-STAT CHEM 8, ED
BUN: 13 mg/dL (ref 6–20)
CREATININE: 0.7 mg/dL (ref 0.44–1.00)
Calcium, Ion: 1.23 mmol/L (ref 1.12–1.23)
Chloride: 105 mmol/L (ref 101–111)
Glucose, Bld: 109 mg/dL — ABNORMAL HIGH (ref 65–99)
HCT: 45 % (ref 36.0–46.0)
Hemoglobin: 15.3 g/dL — ABNORMAL HIGH (ref 12.0–15.0)
Potassium: 3.8 mmol/L (ref 3.5–5.1)
Sodium: 144 mmol/L (ref 135–145)
TCO2: 28 mmol/L (ref 0–100)

## 2015-08-11 MED ORDER — MECLIZINE HCL 25 MG PO TABS: 25.0000 mg | ORAL_TABLET | Freq: Three times a day (TID) | ORAL | Status: DC | PRN

## 2015-08-11 MED ORDER — ONDANSETRON HCL 4 MG PO TABS: 4.0000 mg | ORAL_TABLET | Freq: Three times a day (TID) | ORAL | Status: DC | PRN

## 2015-08-11 MED ORDER — ONDANSETRON 8 MG PO TBDP
8.0000 mg | ORAL_TABLET | Freq: Once | ORAL | Status: AC
  Administered 2015-08-11: 8 mg via ORAL
  Filled 2015-08-11: qty 1

## 2015-08-11 MED ORDER — MECLIZINE HCL 12.5 MG PO TABS
25.0000 mg | ORAL_TABLET | Freq: Once | ORAL | Status: AC
  Administered 2015-08-11: 25 mg via ORAL
  Filled 2015-08-11: qty 2

## 2015-08-11 NOTE — ED Provider Notes (Signed)
CSN: [id number]     Arrival date & time 08/11/15  1815 History   First MD Initiated Contact with Patient 08/11/15 1847     Chief Complaint  Patient presents with  . Dizziness  . Emesis  . Facial Pain     HPI Pt was seen at 1855. Per pt, c/o sudden onset and persistence of constant "dizziness" that began PTA. Describes the dizziness as "everything is moving around." Dizziness worsens with movement of her head side to side. Has been associated with runny/stuffy nose, sinus congestion, and N/V.  Denies fevers, no rash, no CP/SOB, no diarrhea, no sore throat, no abd pain, no visual changes, no focal motor weakness, no tingling/numbness in extremities, no ataxia, no slurred speech, no facial droop.    Past Medical History  Diagnosis Date  . Medical history non-contributory    Past Surgical History  Procedure Laterality Date  . Cesarean section    . Dilation and evacuation N/A 02/03/2013    Procedure: DILATATION AND EVACUATION;  Surgeon: Woodroe Mode, MD;  Location: [facility];  Service: Gynecology;  Laterality: N/A;  . Abdominal hysterectomy N/A 07/07/2013    Procedure: HYSTERECTOMY ABDOMINAL with bilateral salpingo-oophorectomy;  Surgeon: Woodroe Mode, MD;  Location: [facility];  Service: Gynecology;  Laterality: N/A;    Social History  Substance Use Topics  . Smoking status: Current Every Day Smoker -- 0.50 packs/day for 15 years    Types: Cigarettes  . Smokeless tobacco: Never Used  . Alcohol Use: Yes     Comment: occ   OB History    Gravida Para Term Preterm AB TAB SAB Ectopic Multiple Living   4 3   1  1   3      Review of Systems ROS: Statement: All systems negative except as marked or noted in the HPI; Constitutional: Negative for fever and chills. ; ; Eyes: Negative for eye pain, redness and discharge. ; ; ENMT: Negative for ear pain, hoarseness, sore throat. +nasal congestion, +sinus pressure. ; ; Cardiovascular: Negative for chest pain, palpitations, diaphoresis, dyspnea and  peripheral edema. ; ; Respiratory: Negative for cough, wheezing and stridor. ; ; Gastrointestinal: +N/V. Negative for diarrhea, abdominal pain, blood in stool, hematemesis, jaundice and rectal bleeding. . ; ; Genitourinary: Negative for dysuria, flank pain and hematuria. ; ; Musculoskeletal: Negative for back pain and neck pain. Negative for swelling and trauma.; ; Skin: Negative for pruritus, rash, abrasions, blisters, bruising and skin lesion.; ; Neuro: +"dizziness." Negative for headache, lightheadedness and neck stiffness. Negative for weakness, altered level of consciousness , altered mental status, extremity weakness, paresthesias, involuntary movement, seizure and syncope.      Allergies  Strawberry (diagnostic)  Home Medications   Prior to Admission medications   Not on File   BP 130/72 mmHg  Pulse 69  Temp(Src) 98 F (36.7 C) (Oral)  Resp 16  Ht 5\' 1"  (1.549 m)  Wt 192 lb (87.091 kg)  BMI 36.30 kg/m2  SpO2 99%  LMP 06/17/2013 Physical Exam  1900; Physical examination:  Nursing notes reviewed; Vital signs and O2 SAT reviewed;  Constitutional: Well developed, Well nourished, Well hydrated, In no acute distress; Head:  Normocephalic, atraumatic; Eyes: EOMI, PERRL, No scleral icterus; ENMT: TM's clear bilat. +edemetous nasal turbinates bilat with clear rhinorrhea. Mouth and pharynx normal, Mucous membranes moist; Neck: Supple, Full range of motion, No lymphadenopathy; Cardiovascular: Regular rate and rhythm, No murmur, rub, or gallop; Respiratory: Breath sounds clear & equal bilaterally, No rales, rhonchi, wheezes.  Speaking full sentences with ease, Normal respiratory effort/excursion; Chest: Nontender, Movement normal; Abdomen: Soft, Nontender, Nondistended, Normal bowel sounds; Genitourinary: No CVA tenderness; Extremities: Pulses normal, No tenderness, No edema, No calf edema or asymmetry.; Neuro: AA&Ox3, Major CN grossly intact. Speech clear.  No facial droop.  +left horizontal end  gaze fatigable nystagmus which reproduces pt's symptoms. Grips equal. Strength 5/5 equal bilat UE's and LE's.  DTR 2/4 equal bilat UE's and LE's.  No gross sensory deficits.  Normal cerebellar testing bilat UE's (finger-nose) and LE's (heel-shin).; Skin: Color normal, Warm, Dry.   ED Course  Procedures (including critical care time) Labs Review  Imaging Review  I have personally reviewed and evaluated these images and lab results as part of my medical decision-making.   EKG Interpretation None      MDM  MDM Reviewed: previous chart, nursing note and vitals Interpretation: labs and CT scan      Results for orders placed or performed during the hospital encounter of 08/11/15  I-stat Chem 8, ED  Result Value Ref Range   Sodium 144 135 - 145 mmol/L   Potassium 3.8 3.5 - 5.1 mmol/L   Chloride 105 101 - 111 mmol/L   BUN 13 6 - 20 mg/dL   Creatinine, Ser 0.70 0.44 - 1.00 mg/dL   Glucose, Bld 109 (H) 65 - 99 mg/dL   Calcium, Ion 1.23 1.12 - 1.23 mmol/L   TCO2 28 0 - 100 mmol/L   Hemoglobin 15.3 (H) 12.0 - 15.0 g/dL   HCT 45.0 36.0 - 46.0 %   Ct Head Wo Contrast 08/11/2015  CLINICAL DATA:  Headache, nasal congestion, dizziness EXAM: CT HEAD WITHOUT CONTRAST TECHNIQUE: Contiguous axial images were obtained from the base of the skull through the vertex without intravenous contrast. COMPARISON:  None. FINDINGS: There is no evidence of mass effect, midline shift or extra-axial fluid collections. There is no evidence of a space-occupying lesion or intracranial hemorrhage. There is no evidence of a cortical-based area of acute infarction. The ventricles and sulci are appropriate for the patient's age. The basal cisterns are patent. Visualized portions of the orbits are unremarkable. The visualized portions of the paranasal sinuses and mastoid air cells are unremarkable. The osseous structures are unremarkable. IMPRESSION: No acute intracranial pathology. Electronically Signed   By: Kathreen Devoid    On: 08/11/2015 20:21    2055:  Pt feels better after meds and wants to go home now. Pt has tol PO well without N/V. Pt has ambulated with steady gait, easy resps, NAD. Tx symptomatically, f/u ENT. Dx and testing d/w pt and family.  Questions answered.  Verb understanding, agreeable to d/c home with outpt f/u.   Francine Graven, DO 08/14/15 1721

## 2015-08-11 NOTE — Discharge Instructions (Signed)
°Emergency Department Resource Guide °1) Find a Doctor and Pay Out of Pocket °Although you won't have to find out who is covered by your insurance plan, it is a good idea to ask around and get recommendations. You will then need to call the office and see if the doctor you have chosen will accept you as a new patient and what types of options they offer for patients who are self-pay. Some doctors offer discounts or will set up payment plans for their patients who do not have insurance, but you will need to ask so you aren't surprised when you get to your appointment. ° °2) Contact Your Local Health Department °Not all health departments have doctors that can see patients for sick visits, but many do, so it is worth a call to see if yours does. If you don't know where your local health department is, you can check in your phone book. The CDC also has a tool to help you locate your state's health department, and many state websites also have listings of all of their local health departments. ° °3) Find a Walk-in Clinic °If your illness is not likely to be very severe or complicated, you may want to try a walk in clinic. These are popping up all over the country in pharmacies, drugstores, and shopping centers. They're usually staffed by nurse practitioners or physician assistants that have been trained to treat common illnesses and complaints. They're usually fairly quick and inexpensive. However, if you have serious medical issues or chronic medical problems, these are probably not your best option. ° °No Primary Care Doctor: °- Call [facility] at  832-8000 - they can help you locate a primary care doctor that  accepts your insurance, provides certain services, etc. °- Physician Referral Service- 1-800-533-3463 ° °Chronic Pain Problems: °Organization         Address  Phone   Notes  °[facility]  (336) [phone] Patients need to be referred by their primary care doctor.  ° °Medication  Assistance: °Organization         Address  Phone   Notes  °[place] [facility] [address] °[place], [place] [address] (336) 641-8030 --Must be a resident of [place] °-- Must have NO insurance coverage whatsoever (no Medicaid/ Medicare, etc.) °-- The pt. MUST have a primary care doctor that directs their care regularly and follows them in the community °  °[facility]  (866) 331-1348   °[employer]  (888) 892-1162   ° °Agencies that provide inexpensive medical care: °Organization         Address  Phone   Notes  °[facility]  (336) 832-8035   °[facility]    (336) 832-7272   °[facility] [address] °[place], [place] [address] (336) 832-4777   °[facility] [address], °[place] (336) 271-4999   °[facility]    (336) 373-0678   °[facility]    (336) 272-1050   °[facility] ° [address], [place] Phone:  (336) 832-4444, Fax:  (336) 832-4440 Hours of Operation:  9 am - 6 pm, M-F.  Also accepts Medicaid/Medicare and self-pay.  °[facility] ° [address], [place] Phone: (336) 832-3150, Fax: (336) [phone]. Hours of Operation:  8:30 am - 5:30 pm, M-F.  Also accepts Medicaid and self-pay.  °[facility] [place] [address], [place] Phone: (336) 878-6027   °[facility] [address], [place], [place] (336)723-1848, Ext. 123 Mondays & Thursdays: 7-9 AM.  First 15 patients are seen on a first come, first serve basis. °  ° °Medicaid-accepting [place] Providers: ° °Organization         Address  Phone   Notes  °[facility] [address], [place] (336) 641-2100 Also accepts self-pay patients.  °[facility] [address], [place] ° (336) 856-9996   °[facility] [address], [place]  (336) 288-8857   °[facility] [address] [place] [address], [place] (336) 299-7000   °Veita Bland [address], [place]  ° (336) 373-1557 Only accepts [place] Access Medicaid patients after they have their name applied to their card.  ° °Self-Pay (no insurance) in [place]: ° °Organization         Address  Phone   Notes  °Sickle Cell Patients, [facility] [address], [place] (336) 832-1970   °[facility] [address], [place] (336) 832-4400   °[facility] ° [address], [place] (336) 992-4800   °[facility]/Dr. Osei-Bonsu ° [address] [place] [address], [place] or [address], [place] (336) [phone] Phone number for both [place] and [place] locations is the same.  °[facility] [address], [place] (336) 299-0000   °[facility] [address] [place] [address], [place] or [address] (336) 852-7530 °(336) 878-2260   °[facility] [address], [place] (336) 350-1642, phone; (336) 294-5005, fax Sees patients 1st and 3rd Saturday of every month.  Must not qualify for public or private insurance (i.e. Medicaid, Medicare, [place] Health Choice, Veterans' Benefits) • Household income should be no more than 200% of the poverty level •The clinic cannot treat you if you are pregnant or think you are pregnant • Sexually transmitted diseases are not treated at the clinic.  ° ° °Dental Care: °Organization         Address  Phone  Notes  °[place] [facility] [address], [place] (336) 641-6152 Accepts children up to age 21 who are enrolled in Medicaid or [place] Health Choice; pregnant women with a Medicaid card; and children who have applied for Medicaid or [place] Health Choice, but were declined, whose parents can pay a reduced fee at time of service.  °[place]  [facility] [place]  [address], [place] (336) 641-7733 Accepts children up to age 21 who are enrolled in Medicaid or [place] Health Choice; pregnant women with a Medicaid card; and children who have applied for Medicaid or [place] Health Choice, but were declined, whose parents can pay a reduced fee at time of service.  °[facility] ° [address], [place] (336) 641-4533 Patients are seen by appointment only. Walk-ins are not accepted. [facility] will see patients 18 years of age and older. °Monday - Tuesday (8am-5pm) °Most Wednesdays (8:30-5pm) °$30 per visit, cash only  °[facility] ° [address], [place] (336) 641-4533 Patients are seen by appointment only. Walk-ins are not accepted. [facility] will see patients 18 years of age and older. °One   Wednesday Evening (Monthly: Volunteer Based).  $30 per visit, cash only  °[facility]  (919) 537-3737 for adults; Children under age 4, call Graduate Pediatric Dentistry at ([phone]) [phone]. Children aged 4-14, please call ([phone]) [phone] to request a pediatric application. ° Dental services are provided in all areas of dental care including fillings, crowns and bridges, complete and partial dentures, implants, gum treatment, root canals, and extractions. Preventive care is also provided. Treatment is provided to both adults and children. °Patients are selected via a lottery and there is often a waiting list. °  °[facility] [address], °[place] ° (336) 763-8833 [email or web address] °  °[facility] [address], [place], [place] (336)723-1848, Ext. 123 Second and Fourth Thursday of each month, opens at 6:30 AM; Clinic ends at 9 AM.  Patients are seen on a first-come first-served basis, and a limited number are seen during each clinic.  ° °[facility] ° [address], [place], [place] (336) 723-7904    Eligibility Requirements °You must have lived in [place], [place], or [place] counties for at least the last three months. °  You cannot be eligible for state or federal sponsored healthcare insurance, including Veterans Administration, Medicaid, or Medicare. °  You generally cannot be eligible for healthcare insurance through your employer.  °  How to apply: °Eligibility screenings are held every Tuesday and Wednesday afternoon from 1:00 pm until 4:00 pm. You do not need an appointment for the interview!  °[facility] [address], [place], [place] 336-631-2330   °[facility]  336-342-8273   °[facility]  336-703-3100   °[facility]  336-570-6415   ° °Behavioral Health Resources in the Community: °Intensive Outpatient Programs °Organization         Address  Phone  Notes  °[facility] [address], [place], [place] 336-878-6098   °[facility] [address], [place], [place] 336-832-9800   °[facility] [address], [place], [place] ° 336-882-2125   °[facility] [address],  °[place], [place] 1-800-853-5163 or 336-641-4981   °Substance Abuse Resources °Organization         Address  Phone  Notes  °[facility]  336-882-2125   °[facility]  336-784-9470   °[facility]  336-285-9073   °[facility]  336-845-3988   °Residential & Outpatient Substance Abuse Program  1-800-659-3381   °Psychological Services °Organization         Address  Phone  Notes  °[facility]  336- 832-9600   °[facility]  336- 378-7881   °[facility] [address], [place] 1-800-853-5163 or 336-641-4981   ° °Mobile Crisis Teams °Organization         Address  Phone  Notes  °[facility], Mobile Crisis Care Unit  1-877-626-1772   °Assertive °[facility] ° [address]  [place], [place] 336-834-9664   °Sharon DeEsch [address] °[place] 336-554-5454   ° °Self-Help/Support Groups °Organization         Address  Phone             Notes  °[facility] [place] - variety of support groups  336- 373-1402 Call for more information  °Narcotics Anonymous (NA), [facility] [address], °[place]   2 meetings at this location  ° °  Residential Treatment Programs Organization         Address  Phone  Notes  [facility] [address]    [place]  1-206-647-4842   [facility]  [address] [place] [id number], [place], [place]   [place], [place] 8308684697 Admissions: 8am-3pm M-F  [facility] [place] [address]    [place], [place] [id number]   [facility] [address] [place], [place], [place]   [facility] [address]  [place], [place]   [facility] Intensive Outpatient [address], [place], [place]   [facility] ([place]  [place], [place] 1-860 409 4917 or 425-448-0869   [facility] [address], [place], [place] Accepts Medicaid  [facility] [address],  [place] 1-(934)394-1220 Substance Abuse/Addiction Treatment   [facility]         Address  Phone  Notes  [facility]  2367677444   Domenic Schwab, PhD [address] [place], [place]   276-076-9536 or 223-003-0497   [facility] [place], [place] 513-567-4050   [facility] [address] [place], [place] 614-631-0876 Insurance/Medicaid/sponsorship through [facility] and Families [address]                                    [place], [place] [phone] [place] [address], [place] [phone]    Dr. Adele Schilder  [phone]   [facility] Dept. 1) [address] [place] 2) [place] 3)  [address], [place] 251-728-0026 [phone])[phone]  [phone]   [place] 4164590998 or (469) [phone] (After Hours)      Take over the counter decongestant (such as sudafed), as directed on packaging, for the next week.  Use over the counter normal saline nasal spray, as instructed in the Emergency Department, several times per day for the next 2 weeks. Take the prescriptions as directed. Call your regular medical doctor and the ENT doctor tomorrow to schedule a follow up appointment this week.  Return to the Emergency Department immediately if worsening.

## 2015-08-11 NOTE — ED Notes (Signed)
PT states nausea with vomiting, nasal congestion, headache and some dizziness with movement x1 starting around an hour ago today. PT states normal BM this am and denies any urinary symptoms.

## 2015-08-11 NOTE — ED Notes (Signed)
Patient able to ambulate with no assistance with steady gait

## 2020-06-11 ENCOUNTER — Encounter: Payer: Self-pay | Admitting: Pulmonary Disease

## 2020-06-11 ENCOUNTER — Other Ambulatory Visit: Payer: Self-pay

## 2020-06-11 ENCOUNTER — Ambulatory Visit (INDEPENDENT_AMBULATORY_CARE_PROVIDER_SITE_OTHER): Payer: Medicaid Other | Admitting: Pulmonary Disease

## 2020-06-11 ENCOUNTER — Ambulatory Visit (HOSPITAL_COMMUNITY)
Admission: RE | Admit: 2020-06-11 | Discharge: 2020-06-11 | Disposition: A | Discharge status: Home / Self Care | Payer: Medicaid Other | Source: Ambulatory Visit | Attending: Pulmonary Disease | Admitting: Pulmonary Disease

## 2020-06-11 VITALS — BP 158/92 | HR 101 | Temp 98.1°F | Ht 61.0 in | Wt 196.0 lb

## 2020-06-11 DIAGNOSIS — R053 Chronic cough: Secondary | ICD-10-CM | POA: Diagnosis not present

## 2020-06-11 DIAGNOSIS — Z720 Tobacco use: Secondary | ICD-10-CM | POA: Diagnosis not present

## 2020-06-11 DIAGNOSIS — R0683 Snoring: Secondary | ICD-10-CM

## 2020-06-11 MED ORDER — ALBUTEROL SULFATE 108 (90 BASE) MCG/ACT IN AEPB: 2.0000 | INHALATION_SPRAY | Freq: Four times a day (QID) | RESPIRATORY_TRACT | 5 refills | Status: DC | PRN

## 2020-06-11 MED ORDER — ALBUTEROL SULFATE HFA 108 (90 BASE) MCG/ACT IN AERS: 2.0000 | INHALATION_SPRAY | Freq: Four times a day (QID) | RESPIRATORY_TRACT | 6 refills | Status: DC | PRN

## 2020-06-11 NOTE — Progress Notes (Signed)
[facility]  Chief Complaint  Patient presents with  . 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  Brief Summary:  Erica [name] is a 44 y.o. female smoker with snoring and cough.      Subjective:   She smokes about 1 pack per day.  She gets intermittent episodes of coughing.  She will sometimes bring up clear sputum.  She will also get episodes of wheezing.  No history of asthma or allergies.  Never tried any inhalers before.  She also snores.  Her daughter says she stops breathing while asleep.  She has trouble staying awake during the day.  She goes to sleep at different times.  She falls asleep quickly.  She wakes up 2 to 3 times to use the bathroom.  She gets out of bed at 7 am.  She feels tired in the morning.  She denies morning headache.  She does not use anything to help her fall sleep or stay awake.  She denies sleep walking, sleep talking, bruxism, or nightmares.  There is no history of restless legs.  She denies sleep hallucinations, sleep paralysis, or cataplexy.  The Epworth score is 23 out of 24.    Physical Exam:   Appearance - well kempt   ENMT - no sinus tenderness, no oral exudate, no LAN, Mallampati 3 airway, no stridor, poor dentition  Respiratory - equal breath sounds bilaterally, no wheezing or rales  CV - s1s2 regular rate and rhythm, no murmurs  Ext - no clubbing, no edema  Skin - no  rashes  Psych - normal mood and affect   Pulmonary Testing:    Sleep Tests:    Social History:  She  reports that she has been smoking cigarettes. She has a 15.00 pack-year smoking history. She has never used smokeless tobacco. She reports current alcohol use. She reports that she does not use drugs.  Family History:  Her family history includes Hypertension in her mother.    Discussion:  She has chronic cough and wheezing.  She has history of tobacco abuse.  She could have obstructive lung disease.  She has snoring, sleep disruption, apnea, and daytime sleepiness.  She has elevated blood pressure today.  Her BMI is > 35. I am concerned she could have obstructive sleep apnea.  Assessment/Plan:   Chronic cough. - will have her try albuterol prn for now - will arrange for chest xray and pulmonary function test  Tobacco abuse. - she will try nicotine replacement therapy and gradually quitting  Snoring with excessive daytime sleepiness. - will need to arrange for a in lab sleep study  Obesity. - discussed how weight can impact sleep and risk for sleep disordered breathing - discussed options to assist with weight loss: combination of diet modification, cardiovascular and strength training exercises  Cardiovascular risk. - had an extensive discussion regarding the adverse health consequences related to untreated  sleep disordered breathing - specifically discussed the risks for hypertension, coronary artery disease, cardiac dysrhythmias, cerebrovascular disease, and diabetes - lifestyle modification discussed  Safe driving practices. - discussed how sleep disruption can increase risk of accidents, particularly when driving - safe driving practices were discussed  Therapies for obstructive sleep apnea. - if the sleep study shows significant sleep apnea, then various therapies for treatment were reviewed: CPAP, oral appliance, and surgical interventions  Time Spent Involved  in Patient Care on Day of Examination:  47 minutes  Follow up:  Patient Instructions  Albuterol two puffs every 6 hours as needed for coughing, wheezing, chest congestion, or shortness of breath  Will arrange for chest xray, pulmonary function test and in lab sleep study  Follow up in 8 weeks   Medication List:   Allergies as of 06/11/2020      Reactions   Strawberry (diagnostic) Swelling      Medication List       Accurate as of June 11, 2020 10:48 AM. If you have any questions, ask your nurse or doctor.        STOP taking these medications   meclizine 25 MG tablet Commonly known as: ANTIVERT Stopped by: Chesley Mires, MD   ondansetron 4 MG tablet Commonly known as: ZOFRAN Stopped by: Chesley Mires, MD     TAKE these medications   Albuterol Sulfate 108 (90 Base) MCG/ACT Aepb Commonly known as: PROAIR RESPICLICK Inhale 2 puffs into the lungs every 6 (six) hours as needed (cough, wheeze, or shortness of breath). Started by: Chesley Mires, MD   cyclobenzaprine 10 MG tablet Commonly known as: FLEXERIL Take 10 mg by mouth 2 (two) times daily as needed.   ibuprofen 800 MG tablet Commonly known as: ADVIL Take 800 mg by mouth 2 (two) times daily as needed.       Signature:  Chesley Mires, MD [place] Pager - [phone] 06/11/2020, 10:48 AM

## 2020-06-11 NOTE — Patient Instructions (Signed)
Albuterol two puffs every 6 hours as needed for coughing, wheezing, chest congestion, or shortness of breath  Will arrange for chest xray, pulmonary function test and in lab sleep study  Follow up in 8 weeks

## 2020-06-11 NOTE — Addendum Note (Signed)
Addended by: Merrilee Seashore on: 06/11/2020 02:33 PM   Modules accepted: Orders

## 2020-06-12 ENCOUNTER — Telehealth: Payer: Self-pay | Admitting: Pulmonary Disease

## 2020-06-12 DIAGNOSIS — J849 Interstitial pulmonary disease, unspecified: Secondary | ICD-10-CM

## 2020-06-12 NOTE — Telephone Encounter (Signed)
DG Chest 2 View  Result Date: 06/12/2020 CLINICAL DATA:  Chronic cough, shortness of breath EXAM: CHEST - 2 VIEW COMPARISON:  04/18/2013 FINDINGS: The heart size and mediastinal contours are within normal limits. Mild, diffuse interstitial pulmonary opacity. The visualized skeletal structures are unremarkable. IMPRESSION: Mild, diffuse interstitial pulmonary opacity, which may reflect edema, atypical infection, and/or chronic interstitial change. No focal airspace opacity. In the setting of chronic cough, consider ILD protocol CT to further evaluate if there is clinical suspicion for fibrotic interstitial lung disease. Electronically Signed   By: Eddie Candle M.D.   On: 06/12/2020 08:18     Please let her know her chest xray shows scarring in her lungs.  She needs to do a high resolution CT chest to further assess.  Please use ILD for diagnosis code when ordering high resolution CT chest.

## 2020-06-12 NOTE — Telephone Encounter (Signed)
Called and left message on voicemail to please return phone call. Contact number provided. 

## 2020-06-13 NOTE — Telephone Encounter (Signed)
Patient returned phone call. Writer went over xray results per Dr Halford Chessman with patient. All questions answered and patient expressed full understanding. Patient agreeable to Dr Juanetta Gosling recommendation to order high resolution CT. Order placed per Dr Halford Chessman. Nothing further needed at this time.

## 2020-06-13 NOTE — Addendum Note (Signed)
Addended by: Merrilee Seashore on: 06/13/2020 10:44 AM   Modules accepted: Orders

## 2020-06-27 ENCOUNTER — Encounter (HOSPITAL_COMMUNITY): Payer: Self-pay

## 2020-06-27 ENCOUNTER — Ambulatory Visit (HOSPITAL_COMMUNITY)
Admission: RE | Admit: 2020-06-27 | Discharge: 2020-06-27 | Disposition: A | Discharge status: Home / Self Care | Payer: Medicaid Other | Source: Ambulatory Visit | Attending: Pulmonary Disease | Admitting: Pulmonary Disease

## 2020-06-27 ENCOUNTER — Other Ambulatory Visit: Payer: Self-pay

## 2020-06-27 DIAGNOSIS — J849 Interstitial pulmonary disease, unspecified: Secondary | ICD-10-CM | POA: Diagnosis not present

## 2020-07-01 ENCOUNTER — Ambulatory Visit: Payer: Medicaid Other | Attending: Pulmonary Disease | Admitting: Pulmonary Disease

## 2020-07-01 ENCOUNTER — Other Ambulatory Visit: Payer: Self-pay

## 2020-07-01 DIAGNOSIS — G4733 Obstructive sleep apnea (adult) (pediatric): Secondary | ICD-10-CM | POA: Diagnosis not present

## 2020-07-01 DIAGNOSIS — R0683 Snoring: Secondary | ICD-10-CM | POA: Diagnosis present

## 2020-07-02 ENCOUNTER — Telehealth: Payer: Self-pay | Admitting: Pulmonary Disease

## 2020-07-02 NOTE — Telephone Encounter (Signed)
Attempted to call pt but unable to reach. Left message for her to return call. 

## 2020-07-03 ENCOUNTER — Telehealth: Payer: Self-pay | Admitting: Pulmonary Disease

## 2020-07-03 NOTE — Telephone Encounter (Signed)
Pt called and asked for CT results. Pt was advised CT had not resulted. Dr. Craige Cotta would you please review CT and provide results? Thank you

## 2020-07-03 NOTE — Telephone Encounter (Signed)
Dr. Craige Cotta, please advise on pts HRCT results from 12.23.21. Thanks.

## 2020-07-08 NOTE — Telephone Encounter (Signed)
Please see phone note from 07/08/20.

## 2020-07-08 NOTE — Procedures (Signed)
    Patient Name: Erica [name], Erica [name] Date: 07/01/2020 Gender: Female D.O.B: 1975/07/28 Age (years): 89 Referring Provider: Coralyn Helling MD, ABSM Height (inches): 61 Interpreting Physician: Coralyn Helling MD, ABSM Weight (lbs): 193 RPSGT: Alfonso Ellis BMI: 36 MRN: [id number] Neck Size: 14.50  CLINICAL INFORMATION Sleep Study Type: NPSG  Indication for sleep study: History of snoring, sleep disruption, witnessed apnea, and daytime sleepiness.  Epworth Sleepiness Score: 19  SLEEP STUDY TECHNIQUE As per the AASM Manual for the Scoring of Sleep and Associated Events v2.3 (April 2016) with a hypopnea requiring 4% desaturations.  The channels recorded and monitored were frontal, central and occipital EEG, electrooculogram (EOG), submentalis EMG (chin), nasal and oral airflow, thoracic and abdominal wall motion, anterior tibialis EMG, snore microphone, electrocardiogram, and pulse oximetry.  MEDICATIONS Medications self-administered by patient taken the night of the study : N/A  SLEEP ARCHITECTURE The study was initiated at 10:56:13 PM and ended at 5:22:15 AM.  Sleep onset time was 21.2 minutes and the sleep efficiency was 84.7%. The total sleep time was 326.8 minutes.  Stage REM latency was 129.0 minutes.  The patient spent 6.58% of the night in stage N1 sleep, 69.40% in stage N2 sleep, 5.20% in stage N3 and 18.8% in REM.  Alpha intrusion was absent.  Supine sleep was 0.00%.  RESPIRATORY PARAMETERS The overall apnea/hypopnea index (AHI) was 8.3 per hour. There were 0 total apneas, including 0 obstructive, 0 central and 0 mixed apneas. There were 45 hypopneas and 0 RERAs.  The AHI during Stage REM sleep was 30.2 per hour.  AHI while supine was N/A per hour.  The mean oxygen saturation was 92.24%. The minimum SpO2 during sleep was 82.00%.  moderate snoring was noted during this study.  CARDIAC DATA The 2 lead EKG demonstrated sinus rhythm. The mean heart rate was  89.05 beats per minute. Other EKG findings include: None.  LEG MOVEMENT DATA The total PLMS were 0 with a resulting PLMS index of 0.00. Associated arousal with leg movement index was 0.0 .  IMPRESSIONS - Mild obstructive sleep apnea with an AHI of 8.3 and SpO2 low of 82%.  Significant REM effect with REM AHI of 30.2.  occurred during this study (AHI = 8.3/h).  DIAGNOSIS - Obstructive Sleep Apnea (G47.33)  RECOMMENDATIONS - Additional therapies include CPAP, oral appliance, or surgical assessment - Avoid alcohol, sedatives and other CNS depressants that may worsen sleep apnea and disrupt normal sleep architecture. - Sleep hygiene should be reviewed to assess factors that may improve sleep quality. - Weight management and regular exercise should be initiated or continued if appropriate.  [Electronically signed] 07/08/2020 08:57 AM  Coralyn Helling MD, ABSM Diplomate, American Board of Sleep Medicine   NPI: [id number]

## 2020-07-08 NOTE — Telephone Encounter (Addendum)
HRCT chest 07/01/20 >> Patchy mild/mod GGO in peripheral upper lobes b/l, diffuse bronchial wall thickening.  PSG 07/01/20 >> AHI 8.3, SpO2 low 82%.  REM AHI 30.2.  Please let her know her CT chest shows changes in airways that can be seen with asthma, and she also has changes in her lungs that could be related to prior respiratory infection.  Her sleep study showed mild obstructive sleep apnea.  She needs to complete PFT that was ordered on 06/11/20 and then get scheduled for ROV with me or NP.

## 2020-07-08 NOTE — Telephone Encounter (Signed)
Pt is calling back regarding test results - states that she still doesn't understand them - still concerned about the ILD - please return call - [phone]

## 2020-07-08 NOTE — Telephone Encounter (Signed)
Called and went over CT results per Dr Craige Cotta with patient. Patient concerned that the diagnosis says ILD and wants to know why it says that. Dr Craige Cotta please advise.

## 2020-07-09 NOTE — Telephone Encounter (Signed)
Left voicemail explain that ILD was clinical concern based on chest xray finding from 06/12/20 and that is why this was used a diagnosis code when ordering HRCT.  However, HRCT chest did not show evidence for ILD.  Rather HRCT chest more consistent with findings of asthma and prior respiratory infection.  Advised her to call back if she has additional questions.  Otherwise, can review again at next ROV.

## 2020-07-10 ENCOUNTER — Ambulatory Visit (HOSPITAL_COMMUNITY): Admission: RE | Admit: 2020-07-10 | Payer: Medicaid Other | Source: Ambulatory Visit

## 2020-07-12 ENCOUNTER — Telehealth: Payer: Self-pay | Admitting: Pulmonary Disease

## 2020-07-12 NOTE — Telephone Encounter (Signed)
Called and spoke with patient to let her know of message from Dr. Halford Chessman:  Left voicemail explain that ILD was clinical concern based on chest xray finding from 06/12/20 and that is why this was used a diagnosis code when ordering HRCT.  However, HRCT chest did not show evidence for ILD.  Rather HRCT chest more consistent with findings of asthma and prior respiratory infection.  Advised her to call back if she has additional questions.  Otherwise, can review again at next ROV.  Patient has an upcoming OFT on 1/13 and OV 2/2. Patient states that she has more questions in regards to this and the scarring that is seen on scan and does not want to wait until her appointment in February. Informed her that the PFT would possibly provide more answers as far as if she has asthma or not, etc. She expressed understanding. Patient can be reached at [phone]

## 2020-07-15 ENCOUNTER — Other Ambulatory Visit (HOSPITAL_COMMUNITY): Payer: Medicaid Other

## 2020-08-02 ENCOUNTER — Other Ambulatory Visit (HOSPITAL_COMMUNITY)
Admission: RE | Admit: 2020-08-02 | Discharge: 2020-08-02 | Disposition: A | Discharge status: Home / Self Care | Payer: Medicaid Other | Source: Ambulatory Visit | Attending: Pulmonary Disease | Admitting: Pulmonary Disease

## 2020-08-02 DIAGNOSIS — Z01812 Encounter for preprocedural laboratory examination: Secondary | ICD-10-CM | POA: Insufficient documentation

## 2020-08-02 DIAGNOSIS — Z20822 Contact with and (suspected) exposure to covid-19: Secondary | ICD-10-CM | POA: Diagnosis not present

## 2020-08-02 LAB — SARS CORONAVIRUS 2 (TAT 6-24 HRS): SARS Coronavirus 2: NEGATIVE

## 2020-08-07 ENCOUNTER — Other Ambulatory Visit: Payer: Self-pay

## 2020-08-07 ENCOUNTER — Encounter: Payer: Self-pay | Admitting: Pulmonary Disease

## 2020-08-07 ENCOUNTER — Ambulatory Visit (INDEPENDENT_AMBULATORY_CARE_PROVIDER_SITE_OTHER): Payer: Medicaid Other | Admitting: Pulmonary Disease

## 2020-08-07 VITALS — BP 128/90 | HR 104 | Temp 97.7°F | Ht 61.0 in | Wt 195.8 lb

## 2020-08-07 DIAGNOSIS — G4733 Obstructive sleep apnea (adult) (pediatric): Secondary | ICD-10-CM | POA: Diagnosis not present

## 2020-08-07 DIAGNOSIS — Z720 Tobacco use: Secondary | ICD-10-CM | POA: Diagnosis not present

## 2020-08-07 DIAGNOSIS — J4540 Moderate persistent asthma, uncomplicated: Secondary | ICD-10-CM

## 2020-08-07 DIAGNOSIS — G4730 Sleep apnea, unspecified: Secondary | ICD-10-CM

## 2020-08-07 DIAGNOSIS — R918 Other nonspecific abnormal finding of lung field: Secondary | ICD-10-CM

## 2020-08-07 DIAGNOSIS — E669 Obesity, unspecified: Secondary | ICD-10-CM

## 2020-08-07 MED ORDER — VARENICLINE TARTRATE 1 MG PO TABS: 1.0000 mg | ORAL_TABLET | Freq: Two times a day (BID) | ORAL | 3 refills | Status: DC

## 2020-08-07 MED ORDER — VARENICLINE TARTRATE 0.5 MG PO TABS: ORAL_TABLET | ORAL | 0 refills | Status: AC

## 2020-08-07 MED ORDER — BREO ELLIPTA 100-25 MCG/INH IN AEPB: 1.0000 | INHALATION_SPRAY | Freq: Every day | RESPIRATORY_TRACT | 5 refills | Status: DC

## 2020-08-07 NOTE — Addendum Note (Signed)
Addended by: Merrilee Seashore on: 08/07/2020 11:22 AM   Modules accepted: Orders

## 2020-08-07 NOTE — Patient Instructions (Addendum)
Breo one puff daily, and rinse your mouth after each use  Ventolin two puffs every 6 hours as needed for cough, wheeze, or chest congestion  Varenicline (chantix) 0.5 mg pill daily for 3 days, then 0.5 mg pill twice per day for 4 days.  Set your quit date to stop smoking after you have been on varenicline for 1 week.  Then switch to varenicline 1 mg twice per day.  Will reschedule pulmonary function test  Will arrange for auto CPAP set up  Can contact [facility] at [phone]  Follow up in 2 months

## 2020-08-07 NOTE — Progress Notes (Signed)
[facility]  Chief Complaint  Patient presents with  . 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  Brief Summary:  Erica [name] is a 45 y.o. female smoker with asthmatic bronchitis, ground glass opacities on CT chest and obstructive sleep apnea.      Subjective:   She had chest xray on 1/208/21.  Showed interstitial opacities.  She then had high resolution CT chest.  Showed very mild, patchy areas of GGO in upper lungs and bronchial wall thickening.  Nothing else to suggest ILD.  Did sleep study on December.  Showed mild sleep apnea, but significant REM effect.  Her ride fell through, and she wasn't able to get to [facility] to do PFT on 08/06/20.  She is down to 1/2 pack per day.  Didn't like nicotine gum.  Patch kept falling off.  She has cough with clear sputum and chest congestion.  Physical Exam:   Appearance - well kempt   ENMT - no sinus tenderness, no oral exudate, no LAN, Mallampati 3 airway, no stridor, poor dentition  Respiratory - equal breath sounds bilaterally, no wheezing or rales  CV - s1s2 regular rate and rhythm, no murmurs  Ext - no clubbing, no edema  Skin - no rashes  Psych - normal mood and affect   Pulmonary Testing:    Chest imaging:   HRCT chest 07/01/20 >> Patchy mild/mod GGO in peripheral upper lobes b/l, diffuse bronchial wall thickening.  Sleep  Tests:   PSG 07/01/20 >> AHI 8.3, SpO2 low 82%.  REM AHI 30.2.  Social History:  She  reports that she has been smoking cigarettes. She has a 15.00 pack-year smoking history. She has never used smokeless tobacco. She reports current alcohol use. She reports that she does not use drugs.  Family History:  Her family history includes Hypertension in her mother.     Assessment/Plan:   Asthmatic bronchitis. - add breo 100 one puff daily - continue prn albuterol - will reschedule pulmonary function test  Ground glass opacities seen on CT chest. - has appearance of resolving viral pneumonitis - will plan for repeat CT chest in June 2022  Tobacco abuse. - she is willing to try chantix - discussed dosing schedule, setting quit date, and side effects to monitor for  Obstructive sleep apnea. - reviewed her sleep study with her - treatment options discussed - reviewed health consequences of untreated sleep apnea - will arrange for auto CPAP set up  Obesity. - provided contact information for [facility]   Time Spent Involved in Patient Care on Day of Examination:  35 minutes  Follow up:  Patient Instructions  Breo one puff daily, and rinse your mouth after each use  Ventolin two puffs every 6 hours as needed for cough, wheeze, or chest congestion  Varenicline (chantix) 0.5 mg pill daily for 3  days, then 0.5 mg pill twice per day for 4 days.  Set your quit date to stop smoking after you have been on varenicline for 1 week.  Then switch to varenicline 1 mg twice per day.  Will reschedule pulmonary function test  Will arrange for auto CPAP set up  Can contact [facility] at [phone]  Follow up in 2 months   Medication List:   Allergies as of 08/07/2020      Reactions   Strawberry (diagnostic) Swelling      Medication List       Accurate as of August 07, 2020 11:16 AM. If you have any questions, ask  your nurse or doctor.        albuterol 108 (90 Base) MCG/ACT inhaler Commonly known as: VENTOLIN HFA Inhale 2 puffs into the lungs every 6 (six) hours as needed for wheezing or shortness of breath.   Breo Ellipta 100-25 MCG/INH Aepb Generic drug: fluticasone furoate-vilanterol Inhale 1 puff into the lungs daily. Started by: Chesley Mires, MD   cyclobenzaprine 10 MG tablet Commonly known as: FLEXERIL Take 10 mg by mouth 2 (two) times daily as needed.   ibuprofen 800 MG tablet Commonly known as: ADVIL Take 800 mg by mouth 2 (two) times daily as needed.   varenicline 0.5 MG tablet Commonly known as: CHANTIX Take 1 tablet (0.5 mg total) by mouth daily for 3 days, THEN 1 tablet (0.5 mg total) 2 (two) times daily for 4 days. Start taking on: August 07, 2020 Started by: Chesley Mires, MD   varenicline 1 MG tablet Commonly known as: CHANTIX Take 1 tablet (1 mg total) by mouth 2 (two) times daily. Started by: Chesley Mires, MD       Signature:  Chesley Mires, MD [place] Pager - [phone] 08/07/2020, 11:16 AM

## 2020-08-14 ENCOUNTER — Telehealth: Payer: Self-pay | Admitting: Pulmonary Disease

## 2020-08-14 MED ORDER — FLUTICASONE-SALMETEROL 100-50 MCG/DOSE IN AEPB: 1.0000 | INHALATION_SPRAY | Freq: Two times a day (BID) | RESPIRATORY_TRACT | 5 refills | Status: DC

## 2020-08-14 NOTE — Telephone Encounter (Signed)
Will send in Advair 100 Twice daily  To pharm. It is covered by Medicaid   Discussed alternatives for chantix , wellbutrin, nicotine patches , nicotrol .   She says Dr. Halford Chessman  Advised another medication , will have to wait till his return for recs. She is ok with that .

## 2020-08-21 ENCOUNTER — Telehealth: Payer: Self-pay | Admitting: Pulmonary Disease

## 2020-08-21 MED ORDER — BUPROPION HCL ER (SR) 150 MG PO TB12: ORAL_TABLET | ORAL | 2 refills | Status: DC

## 2020-08-21 MED ORDER — FLUTICASONE-SALMETEROL 100-50 MCG/DOSE IN AEPB: 1.0000 | INHALATION_SPRAY | Freq: Two times a day (BID) | RESPIRATORY_TRACT | 5 refills | Status: DC

## 2020-08-21 NOTE — Telephone Encounter (Signed)
Noted.   Will await f/u from VS.

## 2020-08-21 NOTE — Telephone Encounter (Signed)
Other option is bupropion.  Can send script for bupropion 150 mg daily for 3 days, then 150 mg bid.  She should set quit date to stop smoking after she has been on bupropion for 7 days.  Send 1 month supply with 2 refills.  Can d/c chantix vfrom her medication list.

## 2020-08-21 NOTE — Telephone Encounter (Signed)
Called and spoke with pt letting her know the info stated by VS and she verbalized understanding. Rx for wellbutrin  Has been sent to preferred pharmacy for pt. I have also resent Rx for Advair to preferred pharmacy for pt as this was originally sent to wrong pharmacy. Nothing further needed.

## 2020-08-21 NOTE — Telephone Encounter (Signed)
Patient states [facility] does not have RX called in. Patient phone number is [phone].

## 2020-08-22 NOTE — Telephone Encounter (Signed)
Tried initiating PA for pt for her Advair Diskus on CMM. When trying to initiate PA, it asked if pt had tried any of preferred inhalers and due to pt not trying any other inhalers, PA was not attempted and insurance company was called.  Called [employer] at [phone] and spoke with [facility] to see what inhalers were covered by pt's insurance. Per Benancio Deeds, covered inhalers include Advair HFA, Bevespi, Dulera, Symbicort, Combivent Respimat, and Advair Diskus.  When stated that Advair Diskus is covered, asked her if she could tell if pharmacy had tried to fill generic Advair Diskus instead of brand name Advair Diskus since that is the inhaler that we had prescribed for pt. Per [place], pharmacy had tried filling generic name fluticasone-salameterol instead of brand name Advair Diskus. [place] stated if specified brand name, pt will be able to get inhaler.  Called pt's pharmacy and spoke with pharmacy tech Lauren in regards to this. Lauren stated that she did see where generic was rejected but it did say that brand name is covered so she will get inhaler ready for pt.  Called and spoke with pt letting her know this info and she verbalized understanding. Nothing further needed.

## 2020-09-12 ENCOUNTER — Telehealth: Payer: Self-pay | Admitting: Pulmonary Disease

## 2020-09-12 NOTE — Telephone Encounter (Signed)
Pt given RT's phone # to call to r/s.  Patient verbalized understanding.  Nothing further needed at this time.

## 2020-10-25 ENCOUNTER — Other Ambulatory Visit: Payer: Self-pay | Admitting: Pulmonary Disease

## 2020-10-25 NOTE — Telephone Encounter (Signed)
error 

## 2021-01-23 DIAGNOSIS — F17200 Nicotine dependence, unspecified, uncomplicated: Secondary | ICD-10-CM | POA: Insufficient documentation

## 2021-02-05 ENCOUNTER — Encounter: Payer: Self-pay | Admitting: Pulmonary Disease

## 2021-02-05 ENCOUNTER — Ambulatory Visit: Payer: Medicaid Other | Admitting: Pulmonary Disease

## 2021-02-05 ENCOUNTER — Other Ambulatory Visit: Payer: Self-pay

## 2021-02-05 VITALS — BP 140/98 | HR 95 | Temp 96.8°F | Ht 61.0 in | Wt 201.0 lb

## 2021-02-05 DIAGNOSIS — R911 Solitary pulmonary nodule: Secondary | ICD-10-CM | POA: Diagnosis not present

## 2021-02-05 DIAGNOSIS — J4550 Severe persistent asthma, uncomplicated: Secondary | ICD-10-CM | POA: Diagnosis not present

## 2021-02-05 DIAGNOSIS — Z720 Tobacco use: Secondary | ICD-10-CM | POA: Diagnosis not present

## 2021-02-05 DIAGNOSIS — R918 Other nonspecific abnormal finding of lung field: Secondary | ICD-10-CM

## 2021-02-05 DIAGNOSIS — G4733 Obstructive sleep apnea (adult) (pediatric): Secondary | ICD-10-CM

## 2021-02-05 MED ORDER — FLUTICASONE PROPIONATE 50 MCG/ACT NA SUSP: 1.0000 | Freq: Every day | NASAL | 2 refills | Status: DC

## 2021-02-05 MED ORDER — ALBUTEROL SULFATE HFA 108 (90 BASE) MCG/ACT IN AERS: 2.0000 | INHALATION_SPRAY | Freq: Four times a day (QID) | RESPIRATORY_TRACT | 5 refills | Status: DC | PRN

## 2021-02-05 MED ORDER — FLUTICASONE-SALMETEROL 500-50 MCG/ACT IN AEPB: 1.0000 | INHALATION_SPRAY | Freq: Two times a day (BID) | RESPIRATORY_TRACT | 5 refills | Status: DC

## 2021-02-05 MED ORDER — PREDNISONE 10 MG PO TABS: ORAL_TABLET | ORAL | 0 refills | Status: AC

## 2021-02-05 NOTE — Patient Instructions (Signed)
Prednisone 10 mg pill >> 3 pills daily for 2 days, 2 pills daily for 2 days, 1 pill daily for 2 days Will change advair to 500 one puff twice per day, and rinse mouth after each use Will change albuterol inhaler Flonase one spray in each nostril daily Will arrange for pulmonary function test in [place] Will arrange for CT chest Will have medical supply company set up auto CPAP  Follow up in 4 weeks

## 2021-02-05 NOTE — Progress Notes (Signed)
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  Brief Summary:  Erica [name] is a 45 y.o. female smoker with asthmatic bronchitis, ground glass opacities on CT chest and obstructive sleep apnea.      Subjective:   She didn't really use chantix.  Still smoking.  Gets cough, wheeze, and chest congestion.  Also has sinus congestion.  Was scared to find out about test results and didn't schedule CT chest or PFT.  Has been using low dose advair.  Feels albuterol works better, but has respiclick and doesn't like this.  Physical Exam:   Appearance - well kempt   ENMT - no sinus tenderness, no oral exudate, no LAN, Mallampati 3 airway, no stridor  Respiratory - bilateral expiratory wheezing  CV - s1s2 regular rate and rhythm, no murmurs  Ext - no clubbing, no edema  Skin - eczema on hands  Psych - normal mood and affect    Pulmonary Testing:    Chest imaging:  HRCT chest 07/01/20 >> Patchy mild/mod GGO in peripheral upper lobes b/l, diffuse bronchial wall thickening.  Sleep Tests:  PSG 07/01/20 >> AHI 8.3, SpO2 low 82%.  REM AHI 30.2.  Social History:  She  reports that she has been smoking cigarettes. She has a 15.00 pack-year smoking history. She has never used smokeless tobacco. She reports current alcohol use. She reports that she does not use drugs.  Family History:  Her family history includes  Hypertension in her mother.     Assessment/Plan:   Severe, persistent asthma with acute exacerbation. - change advair to 500 bid - change delivery mechanism for albuterol HFA - prednisone taper - reschedule PFT  Allergic rhinitis. - add flonase  Ground glass opacities seen on CT chest. - has appearance of resolving viral pneumonitis - reschedule CT chest w/o contrast  Tobacco abuse. - discussed importance of smoking cessation  Obstructive sleep apnea. - will try to get auto CPAP set up again  Time Spent Involved in Patient Care on Day of Examination:  33 minutes  Follow up:   Patient Instructions  Prednisone 10 mg pill >> 3 pills daily for 2 days, 2 pills daily for 2 days, 1 pill daily for 2 days Will change advair to 500 one puff twice per day, and rinse mouth after each use Will change albuterol inhaler Flonase one spray in each nostril daily Will arrange for pulmonary function test in [place] Will arrange for CT chest Will have medical supply company set up auto CPAP  Follow up in 4 weeks  Medication List:   Allergies as of 02/05/2021       Reactions   Strawberry (diagnostic) Swelling        Medication List        Accurate as of February 05, 2021 11:16 AM. If you have any questions, ask your nurse or doctor.  STOP taking these medications    Fluticasone-Salmeterol 100-50 MCG/DOSE Aepb Commonly known as: Advair Diskus Replaced by: fluticasone-salmeterol 500-50 MCG/ACT Aepb Stopped by: Chesley Mires, MD       TAKE these medications    albuterol 108 (90 Base) MCG/ACT inhaler Commonly known as: VENTOLIN HFA Inhale 2 puffs into the lungs every 6 (six) hours as needed for wheezing or shortness of breath.   buPROPion 150 MG 12 hr tablet Commonly known as: WELLBUTRIN SR Take '150mg'$  daily x3 days, then '150mg'$  twice daily. Patient needs to set quit date to stop smoking after been on Rx for 7 days.   cyclobenzaprine 10 MG tablet Commonly  known as: FLEXERIL Take 10 mg by mouth 2 (two) times daily as needed.   fluticasone 50 MCG/ACT nasal spray Commonly known as: FLONASE Place 1 spray into both nostrils daily. Started by: Chesley Mires, MD   fluticasone-salmeterol 500-50 MCG/ACT Aepb Commonly known as: ADVAIR Inhale 1 puff into the lungs in the morning and at bedtime. Replaces: Fluticasone-Salmeterol 100-50 MCG/DOSE Aepb Started by: Chesley Mires, MD   ibuprofen 800 MG tablet Commonly known as: ADVIL Take 800 mg by mouth 2 (two) times daily as needed.   predniSONE 10 MG tablet Commonly known as: DELTASONE Take 3 tablets (30 mg total) by mouth daily with breakfast for 2 days, THEN 2 tablets (20 mg total) daily with breakfast for 2 days, THEN 1 tablet (10 mg total) daily with breakfast for 2 days. Start taking on: February 05, 2021 Started by: Chesley Mires, MD        Signature:  Chesley Mires, MD [place] Pager - [phone] 02/05/2021, 11:16 AM

## 2021-02-26 ENCOUNTER — Telehealth: Payer: Self-pay | Admitting: Pulmonary Disease

## 2021-02-26 NOTE — Telephone Encounter (Signed)
ATC patient, LMTCB 

## 2021-02-28 NOTE — Telephone Encounter (Signed)
Spoke with pt  She is scheduled with VS for 03/21/21  Needs PFT prior to appt and AP booked out until after that  I scheduled her for [place] office 03/07/21 for PFT and she is fully vaxxed and aware must bring vax card  [place] address provided and nothing further needed

## 2021-03-04 ENCOUNTER — Telehealth: Payer: Self-pay | Admitting: Pulmonary Disease

## 2021-03-04 DIAGNOSIS — R911 Solitary pulmonary nodule: Secondary | ICD-10-CM

## 2021-03-04 DIAGNOSIS — R918 Other nonspecific abnormal finding of lung field: Secondary | ICD-10-CM

## 2021-03-04 DIAGNOSIS — J4550 Severe persistent asthma, uncomplicated: Secondary | ICD-10-CM

## 2021-03-04 NOTE — Telephone Encounter (Signed)
Reference number for call: [id number]  Spoke with Dr. Forest Gleason whose speciality is emergency medicine, [place] medical license number [id number].    She did not feel that CT chest at this time met the insurance companies guidelines.  She requested that the patient have a repeat chest xray and complete PFTs.  After these tests are done, then the insurance company would reconsider whether a CT chest is allowed based on insurance company guidelines.    Please cancel order for CT chest on 03/05/21.   Please schedule patient to have chest xray and make sure patient has PFT scheduled.  Will call her back after review of these and then see if we can then order follow up CT chest.

## 2021-03-04 NOTE — Telephone Encounter (Signed)
-----   Message from Joellen Jersey sent at 03/04/2021  9:29 AM EDT ----- Regarding: chest ct Pt had chest ct scheduled for 03/05/21'@annie'$  peen it has been cancelled because insurance denied the ct do you want to do a peer to peer [phone] case # [id number] thanks libby

## 2021-03-05 ENCOUNTER — Ambulatory Visit (HOSPITAL_COMMUNITY): Payer: Medicaid Other

## 2021-03-05 NOTE — Telephone Encounter (Signed)
It looks like PFT has been scheduled for 9/2 in the [place] office with an OV w/ VS in [place] for 9/16.  Will you please place an order for the CXR & will it be ok for this patient to complete the CXR at the [facility] office when the PFT is completed?

## 2021-03-07 ENCOUNTER — Ambulatory Visit: Payer: Medicaid Other

## 2021-03-07 NOTE — Telephone Encounter (Signed)
Called and spoke with patient to let her know that when she gets to the office for her PFT Dr. Halford Chessman would also like for her to have a chest x-ray. Patient expressed understanding. Nothing further needed at this time.

## 2021-03-11 ENCOUNTER — Ambulatory Visit: Payer: Medicaid Other

## 2021-03-13 ENCOUNTER — Telehealth: Payer: Self-pay | Admitting: Pulmonary Disease

## 2021-03-14 NOTE — Telephone Encounter (Signed)
I checked with [facility] and CT denied.  She sent message to VS on 8/30 to do a peer to peer and hasn't heard back from him yet.  I tried to call pt to reschedule her pft but vm is full.

## 2021-03-17 NOTE — Telephone Encounter (Signed)
Pt no showed for pft and chest ct was cancelled will discuss further when pt returns call Erica [name]

## 2021-03-19 ENCOUNTER — Telehealth: Payer: Self-pay | Admitting: Pulmonary Disease

## 2021-03-19 NOTE — Telephone Encounter (Signed)
I know it was denied to sent message to MD Joellen Jersey

## 2021-03-19 NOTE — Telephone Encounter (Signed)
Pt no showed for her PFT & will need to be rescheduled by the front desk.    [facility]- do you know the status of the CT auth?

## 2021-03-21 ENCOUNTER — Ambulatory Visit: Payer: Medicaid Other | Admitting: Pulmonary Disease

## 2021-03-21 NOTE — Telephone Encounter (Signed)
Patient was scheduled for appt 9/16 and is in the hospital at [facility] in [place].  States they were going to do a CT while she's in the hospital.  Patient is rescheduled for PFT and follow-up with Dr. Halford Chessman in [facility] 10/18.  Made patient aware that she would want to get a copy of her CT on a CD to bring to appt.  Just making Dr. Halford Chessman aware of hospitalization.

## 2021-04-22 ENCOUNTER — Other Ambulatory Visit: Payer: Self-pay

## 2021-04-22 ENCOUNTER — Encounter: Payer: Self-pay | Admitting: Pulmonary Disease

## 2021-04-22 ENCOUNTER — Ambulatory Visit (INDEPENDENT_AMBULATORY_CARE_PROVIDER_SITE_OTHER): Payer: Medicaid Other | Admitting: Pulmonary Disease

## 2021-04-22 ENCOUNTER — Ambulatory Visit (INDEPENDENT_AMBULATORY_CARE_PROVIDER_SITE_OTHER): Payer: Medicaid Other

## 2021-04-22 VITALS — BP 130/84 | HR 110 | Ht 60.5 in | Wt 208.4 lb

## 2021-04-22 DIAGNOSIS — G4733 Obstructive sleep apnea (adult) (pediatric): Secondary | ICD-10-CM | POA: Diagnosis not present

## 2021-04-22 DIAGNOSIS — J4550 Severe persistent asthma, uncomplicated: Secondary | ICD-10-CM

## 2021-04-22 DIAGNOSIS — Z8701 Personal history of pneumonia (recurrent): Secondary | ICD-10-CM

## 2021-04-22 DIAGNOSIS — Z720 Tobacco use: Secondary | ICD-10-CM

## 2021-04-22 LAB — PULMONARY FUNCTION TEST
DL/VA % pred: 113 %
DL/VA: 5.13 ml/min/mmHg/L
DLCO cor % pred: 86 %
DLCO cor: 16.63 ml/min/mmHg
DLCO unc % pred: 84 %
DLCO unc: 16.26 ml/min/mmHg
FEF 25-75 Post: 2.74 L/sec
FEF 25-75 Pre: 2.04 L/sec
FEF2575-%Change-Post: 34 %
FEF2575-%Pred-Post: 110 %
FEF2575-%Pred-Pre: 82 %
FEV1-%Change-Post: 5 %
FEV1-%Pred-Post: 89 %
FEV1-%Pred-Pre: 85 %
FEV1-Post: 1.95 L
FEV1-Pre: 1.85 L
FEV1FVC-%Change-Post: 8 %
FEV1FVC-%Pred-Pre: 99 %
FEV6-%Change-Post: -3 %
FEV6-%Pred-Post: 83 %
FEV6-%Pred-Pre: 86 %
FEV6-Post: 2.17 L
FEV6-Pre: 2.24 L
FEV6FVC-%Change-Post: 0 %
FEV6FVC-%Pred-Post: 102 %
FEV6FVC-%Pred-Pre: 102 %
FVC-%Change-Post: -3 %
FVC-%Pred-Post: 81 %
FVC-%Pred-Pre: 84 %
FVC-Post: 2.17 L
Post FEV1/FVC ratio: 90 %
Post FEV6/FVC ratio: 100 %
Pre FEV1/FVC ratio: 83 %
Pre FEV6/FVC Ratio: 100 %
RV % pred: 98 %
RV: 1.45 L
TLC % pred: 81 %
TLC: 3.71 L

## 2021-04-22 MED ORDER — MONTELUKAST SODIUM 10 MG PO TABS: 10.0000 mg | ORAL_TABLET | Freq: Every day | ORAL | 5 refills | Status: DC

## 2021-04-22 NOTE — Progress Notes (Signed)
PFT done today. 

## 2021-04-22 NOTE — Progress Notes (Signed)
[facility]  Chief Complaint  Patient presents with   Follow-up    Hospitalized 9/13-9/18 at [facility], had CT scan during hospitalization for pna.  Feeling better now.    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  Brief Summary:  Erica [name] is a 45 y.o. female smoker with asthmatic bronchitis, ground glass opacities on CT chest and obstructive sleep apnea.      Subjective:   She was admitted to [facility] for pneumonia from rhinovirus.  Feeling better not.  Got a nebulizer, but not needing it now.  Didn't need oxygen at home.  Still using albuterol inhaler bid.  Has more sinus congestion.  Not having cough, wheeze, or sputum.  Should be getting CPAP next month.  PFT today was normal.  Physical Exam:   Appearance - well kempt   ENMT - no sinus tenderness, no oral exudate, no LAN, Mallampati 3 airway, no stridor  Respiratory - equal breath sounds bilaterally, no wheezing or rales  CV - s1s2 regular rate and rhythm, no murmurs  Ext - no clubbing, no edema  Skin - no rashes  Psych - normal mood and affect     Pulmonary Testing:  PFT 04/22/21 >> FEV1 1.95 (89%), FEV1% 90, TLC 3.71 (81%), DLCO 84%  Chest imaging:  HRCT chest 07/01/20 >> Patchy mild/mod GGO in peripheral upper lobes b/l, diffuse bronchial wall thickening. CT chest 03/21/21 >> b/l patchy opacities  Sleep Tests:  PSG 07/01/20 >> AHI 8.3, SpO2 low 82%.  REM AHI 30.2.  Social History:  She  reports that she has been smoking cigarettes. She  has a 15.00 pack-year smoking history. She has never used smokeless tobacco. She reports current alcohol use. She reports that she does not use drugs.  Family History:  Her family history includes Hypertension in her mother.     Assessment/Plan:   Severe, persistent asthma with acute exacerbation. - continue advair 500 one puff bid - prn ventolin HFA - she has home nebulizer machine also - will add singulair 10 mg qhs  Allergic rhinitis. - continue flonase 1 spray each nostril daily - add singulair  Ground glass opacities seen on CT chest. - likely from viral pneumonitis - will repeat chest xray today and then determine if she needs follow up CT chest  Tobacco abuse. - she will try to gradually quit  Obstructive sleep apnea. - she should be getting auto CPAP set up in November 2022 through [employer]  Time Spent Involved in Patient Care on Day of Examination:  34 minutes  Follow up:   Patient Instructions  Chest xray today  Montelukast (singulair) 10 mg pill nightly  Follow up in 2 to 3 months in [place] office  Medication List:   Allergies as of 04/22/2021       Reactions   Strawberry (diagnostic) Swelling        Medication List        Accurate as of April 22, 2021 11:59 AM. If you have any questions,  ask your nurse or doctor.          albuterol 108 (90 Base) MCG/ACT inhaler Commonly known as: VENTOLIN HFA Inhale 2 puffs into the lungs every 6 (six) hours as needed for wheezing or shortness of breath.   buPROPion 150 MG 12 hr tablet Commonly known as: WELLBUTRIN SR Take 150mg  daily x3 days, then 150mg  twice daily. Patient needs to set quit date to stop smoking after been on Rx for 7 days.   cyclobenzaprine 10 MG tablet Commonly known as: FLEXERIL Take 10 mg by mouth 2 (two) times daily as needed.   fluticasone 50 MCG/ACT nasal spray Commonly known as: FLONASE Place 1 spray into both nostrils daily.   fluticasone-salmeterol 500-50 MCG/ACT  Aepb Commonly known as: ADVAIR Inhale 1 puff into the lungs in the morning and at bedtime.   ibuprofen 800 MG tablet Commonly known as: ADVIL Take 800 mg by mouth 2 (two) times daily as needed.   montelukast 10 MG tablet Commonly known as: SINGULAIR Take 1 tablet (10 mg total) by mouth at bedtime. Started by: Chesley Mires, MD        Signature:  Chesley Mires, MD [place] Pager - [phone] 04/22/2021, 11:59 AM

## 2021-04-22 NOTE — Patient Instructions (Signed)
Chest xray today  Montelukast (singulair) 10 mg pill nightly  Follow up in 2 to 3 months in [place] office

## 2021-05-26 NOTE — Telephone Encounter (Signed)
Patient seen in office 10/18.   Nothing further needed at this time.

## 2021-06-25 ENCOUNTER — Ambulatory Visit: Payer: Medicaid Other | Admitting: Pulmonary Disease

## 2021-07-11 ENCOUNTER — Telehealth: Payer: Self-pay | Admitting: Pulmonary Disease

## 2021-07-11 NOTE — Telephone Encounter (Signed)
Atc patient regarding med records request. Left detailed message on vm (OK PER DPR). Gave patient phone number: [phone] to call to get med records and advised her to give [place] a call back if she had any further questions/ concerns

## 2021-07-23 ENCOUNTER — Telehealth: Payer: Self-pay | Admitting: Pulmonary Disease

## 2021-07-23 MED ORDER — PREDNISONE 10 MG PO TABS: ORAL_TABLET | ORAL | 0 refills | Status: AC

## 2021-07-23 MED ORDER — ALBUTEROL SULFATE HFA 108 (90 BASE) MCG/ACT IN AERS: 2.0000 | INHALATION_SPRAY | Freq: Four times a day (QID) | RESPIRATORY_TRACT | 2 refills | Status: DC | PRN

## 2021-07-23 MED ORDER — COMBIVENT RESPIMAT 20-100 MCG/ACT IN AERS: 1.0000 | INHALATION_SPRAY | Freq: Four times a day (QID) | RESPIRATORY_TRACT | 2 refills | Status: DC

## 2021-07-23 NOTE — Telephone Encounter (Signed)
ATC patient. LMTCB with [facility] office number  

## 2021-07-23 NOTE — Telephone Encounter (Signed)
Patient called and states that she was seen at PCP and had elevated WBC and they noted she had a lot of wheezing and wanted her to follow up with pulmonary. Patient states she had her rescue inhaler changed and since then her wheezing has been worse. Patient is running out of inhaler and is unsure if she should get it refilled or if she can have a new prescription.   Patient is also having some concerns with CPAP machine.   Refused appt in [place] office and no available appts in [place] office. Patient sees Dr. Halford Chessman and wanted a sooner appt than 2/28 but no openings.   Will send as a hp message to Dr. Loanne Drilling in regards to inhaler.

## 2021-07-23 NOTE — Telephone Encounter (Signed)
[facility] Telephone Encounter  She reports she using Ventolin (usually uses ProAir) and is having wheezing, cough and shortness of breath that began a few weeks ago.  I acknowledged her frustration with the discontinuation of ProAir.   Assessment Asthma exacerbation --Prednisone taper --START combivent PRN for shortness of breath or wheezing --START albuterol PRN nebulizer for treatment --I prescribed albuterol inhaler and advised patient to discuss with pharmacy other options aside from ventolin  Call for follow-up as needed.

## 2021-07-23 NOTE — Addendum Note (Signed)
Addended by: Rodman Pickle on: 07/23/2021 04:59 PM   Modules accepted: Orders

## 2021-08-18 ENCOUNTER — Telehealth: Payer: Self-pay | Admitting: Pulmonary Disease

## 2021-08-18 NOTE — Telephone Encounter (Signed)
Okay to send script for albuterol for nebulizer.  Please schedule ROV for 08/19/21.

## 2021-08-18 NOTE — Telephone Encounter (Signed)
Primary Pulmonologist: Dr. Halford Chessman  Last office visit and with whom: 04/22/21 Dr. Halford Chessman What do we see them for (pulmonary problems): asthma Last OV assessment/plan: see below  Was appointment offered to patient (explain)?  None available today in either office but patient is willing to come to [facility] for an appt tomorrow if needed.   Reason for call: Patient calls and states her SOB has increased over the last couple of weeks and she is needing to use her rescue inhaler every 3-4 hours. She states on her last sick call she was prescribed a new rescue inhaler (combivent) since proair was discontinued. She says it was working well for a few weeks and now she is struggling with SOB again. Patient was also told to start albuterol nebulizer by Dr. Loanne Drilling during last sick call but states she needs a new prescription since she thought she already had some at home but is out. She states she has been so SOB that she has gotten lightheaded and felt she was going to pass out. O2 sats remain about 90% when checked.   Dr. Halford Chessman please advise  Allergies  Allergen Reactions   Strawberry (Diagnostic) Swelling    Immunization History  Administered Date(s) Administered   Influenza,inj,Quad PF,6+ Mos 12/08/2019   Moderna Sars-Covid-2 Vaccination 06/05/2020, 08/29/2020   Pneumococcal Polysaccharide-23 02/04/2013     Assessment/Plan:    Severe, persistent asthma with acute exacerbation. - continue advair 500 one puff bid - prn ventolin HFA - she has home nebulizer machine also - will add singulair 10 mg qhs   Allergic rhinitis. - continue flonase 1 spray each nostril daily - add singulair   Ground glass opacities seen on CT chest. - likely from viral pneumonitis - will repeat chest xray today and then determine if she needs follow up CT chest   Tobacco abuse. - she will try to gradually quit   Obstructive sleep apnea. - she should be getting auto CPAP set up in November 2022 through [employer]   Time  Spent Involved in Patient Care on Day of Examination:  34 minutes   Follow up:    Patient Instructions  Chest xray today   Montelukast (singulair) 10 mg pill nightly   Follow up in 2 to 3 months in [place] office

## 2021-08-18 NOTE — Telephone Encounter (Signed)
Called and spoke to patient. Scheduled her for acute visit on 2/14 with MW in [place] office.She would like to wait on albuterol nebs to be sent in until she sees a provider tomorrow. Nothing further needed.

## 2021-08-19 ENCOUNTER — Other Ambulatory Visit: Payer: Self-pay

## 2021-08-19 ENCOUNTER — Encounter: Payer: Self-pay | Admitting: Internal Medicine

## 2021-08-19 ENCOUNTER — Ambulatory Visit (INDEPENDENT_AMBULATORY_CARE_PROVIDER_SITE_OTHER): Payer: Medicaid Other | Admitting: Internal Medicine

## 2021-08-19 ENCOUNTER — Telehealth: Payer: Self-pay | Admitting: Internal Medicine

## 2021-08-19 DIAGNOSIS — F17210 Nicotine dependence, cigarettes, uncomplicated: Secondary | ICD-10-CM | POA: Insufficient documentation

## 2021-08-19 DIAGNOSIS — J4551 Severe persistent asthma with (acute) exacerbation: Secondary | ICD-10-CM

## 2021-08-19 MED ORDER — ALBUTEROL SULFATE (2.5 MG/3ML) 0.083% IN NEBU: 2.5000 mg | INHALATION_SOLUTION | RESPIRATORY_TRACT | 12 refills | Status: DC | PRN

## 2021-08-19 MED ORDER — TRAMADOL HCL 50 MG PO TABS: 50.0000 mg | ORAL_TABLET | ORAL | 2 refills | Status: AC | PRN

## 2021-08-19 MED ORDER — PREDNISONE 10 MG PO TABS: ORAL_TABLET | ORAL | 0 refills | Status: DC

## 2021-08-19 MED ORDER — PANTOPRAZOLE SODIUM 40 MG PO TBEC: 40.0000 mg | DELAYED_RELEASE_TABLET | Freq: Every day | ORAL | 2 refills | Status: DC

## 2021-08-19 MED ORDER — AMOXICILLIN-POT CLAVULANATE 875-125 MG PO TABS: 1.0000 | ORAL_TABLET | Freq: Two times a day (BID) | ORAL | 0 refills | Status: AC

## 2021-08-19 MED ORDER — COMBIVENT RESPIMAT 20-100 MCG/ACT IN AERS: 1.0000 | INHALATION_SPRAY | RESPIRATORY_TRACT | Status: DC | PRN

## 2021-08-19 MED ORDER — FAMOTIDINE 20 MG PO TABS: ORAL_TABLET | ORAL | 11 refills | Status: AC

## 2021-08-19 NOTE — Progress Notes (Signed)
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 Dec 2022  no better with prednisone some better with combivent  but thoroughly confused with details of care so referred for acute evaluation.      History of Present Illness  08/19/2021  Pulmonary/ Acute officeeval/ Erica [name] / [place] not on maint r (was on advair stopped a week prior to OV  "not working'  Chief Complaint  Patient presents with   Acute Visit    Acute patient using rescue inhaler every 3-4 hours. Wheezing, coughing, SOB   Wants to talk about nebulizer medications   Started after proair was Dc'd.  Dyspnea:  50 ft x sev months  Cough: slt greenish, worse p supper assoc nasal congestion  Sleep: waking up with cough / bed is flat 2 pillows  SABA use: way too much   No obvious day to day or daytime variability or assoc mucus plugs or hemoptysis or cp or chest tightness,  overt  hb symptoms.   Also denies any obvious fluctuation of symptoms with weather or environmental changes or other aggravating or alleviating factors except as outlined above   No unusual exposure hx or h/o childhood pna/ asthma or knowledge of premature birth.  Current Allergies, Complete Past Medical History, Past Surgical History, Family History, and Social History were reviewed in [employer] record.  ROS  The following are not active complaints unless bolded Hoarseness, sore throat, dysphagia, dental problems, itching, sneezing,  nasal congestion or discharge of excess mucus or purulent secretions, ear ache,   fever, chills, sweats, unintended wt loss or wt gain, classically pleuritic or exertional cp,  orthopnea pnd or arm/hand swelling  or leg swelling, presyncope, palpitations, abdominal pain, anorexia, nausea, vomiting, diarrhea  or change  in bowel habits or change in bladder habits, change in stools or change in urine, dysuria, hematuria,  rash, arthralgias, visual complaints, headache, numbness, weakness or ataxia or problems with walking or coordination,  change in mood or  memory.          Past Medical History:  Diagnosis Date   Pelvic inflammatory disease    Tubo-ovarian abscess     Outpatient Medications Prior to Visit  Medication Sig Dispense Refill   buPROPion (WELLBUTRIN SR) 150 MG 12 hr tablet Take 150mg  daily x3 days, then 150mg  twice daily. Patient needs to set quit date to stop smoking after been on Rx for 7 days. 60 tablet 2   cyclobenzaprine (FLEXERIL) 10 MG tablet Take 10 mg by mouth 2 (two) times daily as needed.     diltiazem (CARTIA XT) 180 MG 24 hr capsule Take 180 mg by mouth daily.     fluticasone (FLONASE) 50 MCG/ACT nasal spray Place 1 spray into both nostrils daily. 16 g 2   fluticasone-salmeterol (ADVAIR) 500-50 MCG/ACT AEPB Inhale 1 puff into the lungs in the morning and at bedtime. 60 each 5   Ipratropium-Albuterol (COMBIVENT RESPIMAT) 20-100 MCG/ACT AERS respimat Inhale 1 puff into the lungs every 6 (six) hours. 4 g 2   montelukast (SINGULAIR) 10 MG tablet Take 1 tablet (10 mg total) by mouth at bedtime. 30 tablet 5   albuterol (VENTOLIN HFA) 108 (90 Base) MCG/ACT inhaler Inhale 2 puffs into the lungs every 6 (six) hours as needed for  wheezing or shortness of breath. 8 g 2   ibuprofen (ADVIL) 800 MG tablet Take 800 mg by mouth 2 (two) times daily as needed.     No facility-administered medications prior to visit.     Objective:     BP (!) 142/88 (BP Location: Left Arm, Patient Position: Sitting)    Pulse (!) 104    Temp 98.7 F (37.1 C) (Temporal)    Ht 5' (1.524 m)    Wt 207 lb (93.9 kg)    LMP 06/17/2013    SpO2 98% Comment: ra   BMI 40.43 kg/m   SpO2: 98 % (ra)  Obese amb bf  with prominent pseudowheeze    HEENT : pt wearing mask not removed for exam due to covid -19 concerns.     NECK :  without JVD/Nodes/TM/ nl carotid upstrokes bilaterally   LUNGS: no acc muscle use,  Nl contour chest with poor air movement bilaterally    CV:  RRR  no s3 or murmur or increase in P2, and no edema   ABD:  soft and nontender with nl inspiratory excursion in the supine position. No bruits or organomegaly appreciated, bowel sounds nl  MS:  Nl gait/ ext warm without deformities, calf tenderness, cyanosis or clubbing No obvious joint restrictions   SKIN: warm and dry without lesions    NEURO:  alert, approp, nl sensorium with  no motor or cerebellar deficits apparent.      I personally reviewed images and agree with radiology impression as follows:  CXR:   pa and lateral 04/22/21  No active cardiopulmonary disease.      Assessment   No problem-specific Assessment & Plan notes found for this encounter.     Christinia Gully, MD 08/19/2021

## 2021-08-19 NOTE — Assessment & Plan Note (Signed)
Counseled re importance of smoking cessation but did not meet time criteria for separate billing            Each maintenance medication was reviewed in detail including emphasizing most importantly the difference between maintenance and prns and under what circumstances the prns are to be triggered using an action plan format where appropriate.  Total time for H and P, chart review, counseling, reviewing smi device(s) and generating customized AVS unique to this cute  office visit / same day charting > 40 min with pt new to me

## 2021-08-19 NOTE — Patient Instructions (Signed)
Albuterol 2.5 mg with Ipatropium four times daily per neb  Only use your combivent as a rescue medication to be used if you can't catch your breath by resting or doing a relaxed purse lip breathing pattern.  - The less you use it, the better it will work when you need it. - Ok to use up to 1puffs  every 4 hours if you must but call for immediate appointment if use goes up over your usual need - Don't leave home without it !!  (think of it like the spare tire for your car)   Prednisone 10 mg take  4 each am x 2 days,   2 each am x 2 days,  1 each am x 2 days and stop   Augmentin 875 mg take one pill twice daily  X 10 days - take at breakfast and supper with large glass of water.  It would help reduce the usual side effects (diarrhea and yeast infections) if you ate cultured yogurt at lunch.   Take mucinex dm 1200 mg  every 12 hours and supplement if needed with  tramadol 50 mg up to 1-2 every 4 hours to suppress the urge to cough. Swallowing water and/or using ice chips/non mint and menthol containing candies (such as lifesavers or sugarless jolly ranchers) are also effective.  You should rest your voice and avoid activities that you know make you cough.  Once you have eliminated the cough for 3 straight days try reducing the tramadol first,  then the mucinex dm as tolerated   Please schedule a follow up office visit in 1 week  sooner if needed  with all medications /inhalers/ solutions in hand so we can verify exactly what you are taking. This includes all medications from all doctors and over the counters   If worse in meantime go to ER

## 2021-08-19 NOTE — Telephone Encounter (Signed)
Updated work note and sent to patient on mychart. Called and made pt aware of updated note. She will come by office and pick up new letter tomorrow. Nothing further needed/

## 2021-08-19 NOTE — Assessment & Plan Note (Addendum)
Active smoker  - 08/19/2021  After extensive coaching inhaler device,  effectiveness =   50% so change to duoneb qid x one week trial   DDX of  difficult airways management almost all start with A and  include Adherence, Ace Inhibitors, Acid Reflux, Active Sinus Disease, Alpha 1 Antitripsin deficiency, Anxiety masquerading as Airways dz,  ABPA,  Allergy(esp in young), Aspiration (esp in elderly), Adverse effects of meds,  Active smoking or vaping, A bunch of PE's (a small clot burden can't cause this syndrome unless there is already severe underlying pulm or vascular dz with poor reserve) plus two Bs  = Bronchiectasis and Beta blocker use..and one C= CHF  Adherence is always the initial "prime suspect" and is a multilayered concern that requires a "trust but verify" approach in every patient - starting with knowing how to use medications, especially inhalers, correctly, keeping up with refills and understanding the fundamental difference between maintenance and prns vs those medications only taken for a very short course and then stopped and not refilled.  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   Active smoking (see separate a/p)   ? Allergy > continue singulair for now and add  Prednisone 10 mg take  4 each am x 2 days,   2 each am x 2 days,  1 each am x 2 days and stop   ? Active sinus dz > augmentin x 10 days  ? Acid (or non-acid) GERD > always difficult to exclude as up to 75% of pts in some series report no assoc GI/ Heartburn symptoms> rec max (24h)  acid suppression and diet restrictions/ reviewed and instructions given in writing.   ?  Adverse effects of advair >advair not working so stop it anyway and regroup in a week - with prominent cough better to rx hfa if can learn technique  ? Bronchiectasis > neg HRCT 12/23 /2021   >>> f/u in one week as above

## 2021-08-26 ENCOUNTER — Ambulatory Visit: Payer: Medicaid Other | Admitting: Internal Medicine

## 2021-08-26 NOTE — Progress Notes (Unsigned)
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 Dec 2022  no better with prednisone some better with combivent  but thoroughly confused with details of care so referred for acute evaluation.      History of Present Illness  08/19/2021  Pulmonary/ Acute officeeval/ Erica [name] / [place] not on maint r (was on advair stopped a week prior to OV  "not working'  Chief Complaint  Patient presents with   Acute Visit    Acute patient using rescue inhaler every 3-4 hours. Wheezing, coughing, SOB   Wants to talk about nebulizer medications   Started after proair was Dc'd.  Dyspnea:  50 ft x sev months  Cough: slt greenish, worse p supper assoc nasal congestion  Sleep: waking up with cough / bed is flat 2 pillows  SABA use: way too much  Rec Albuterol 2.5 mg with Ipatropium four times daily per neb Only use your combivent as a rescue medication  Prednisone 10 mg take  4 each am x 2 days,   2 each am x 2 days,  1 each am x 2 days and stop  Augmentin 875 mg take one pill twice daily  X 10 days  Take mucinex dm 1200 mg  every 12 hours and supplement if needed with  tramadol 50 mg up to 1-2 every 4 hours  Please schedule a follow up office visit in 1 week  sooner if needed  with all medications /inhalers/ solutions in hand so we can verify exactly what you are taking. This includes all medications from all doctors and over the counters  If worse in meantime go to ER    08/26/2021  f/u ov/Erica [name] office/Erica [name] re: *** maint on ***  No chief complaint on file.   Dyspnea:  *** Cough: *** Sleeping: *** SABA use: *** 02: *** Covid status: *** Lung cancer screening: ***   No obvious day to day or daytime variability or assoc excess/ purulent sputum or mucus plugs or hemoptysis or cp or chest tightness,  subjective wheeze or overt sinus or hb symptoms.   *** without nocturnal  or early am exacerbation  of respiratory  c/o's or need for noct saba. Also denies any obvious fluctuation of symptoms with weather or environmental changes or other aggravating or alleviating factors except as outlined above   No unusual exposure hx or h/o childhood pna/ asthma or knowledge of premature birth.  Current Allergies, Complete Past Medical History, Past Surgical History, Family History, and Social History were reviewed in [employer] record.  ROS  The following are not active complaints unless bolded Hoarseness, sore throat, dysphagia, dental problems, itching, sneezing,  nasal congestion or discharge of excess mucus or purulent secretions, ear ache,   fever, chills, sweats, unintended wt loss or wt gain, classically pleuritic or exertional cp,  orthopnea pnd or arm/hand swelling  or leg swelling, presyncope, palpitations, abdominal pain, anorexia, nausea, vomiting, diarrhea  or change in bowel habits or change in bladder habits, change in stools or change in urine, dysuria, hematuria,  rash, arthralgias, visual complaints, headache, numbness, weakness or ataxia or problems with walking or coordination,  change in mood or  memory.        No outpatient medications have been marked as taking for the  08/26/21 encounter (Appointment) with Tanda Rockers, MD.            Objective:     Wt Readings from Last 3 Encounters:  08/19/21 207 lb (93.9 kg)  04/22/21 208 lb 6.4 oz (94.5 kg)  02/05/21 201 lb (91.2 kg)      Vital signs reviewed  08/26/2021  - Note at rest 02 sats  ***% on ***   General appearance:    ***          Assessment

## 2021-09-01 ENCOUNTER — Telehealth: Payer: Self-pay | Admitting: Pulmonary Disease

## 2021-09-01 NOTE — Telephone Encounter (Signed)
Spoke to [employer] with [employer]. Denice Paradise is aware that patient will be able to receive refills on respiratory medications, as patient was last seen 08/19/2021. Patient was scheduled to see Dr. Halford Chessman tomorrow, but had to cancel as she will not be released until 09/21/2021. Denice Paradise will have patient contact our office to reschedule visit with Dr. Halford Chessman. Nothing further needed.

## 2021-09-02 ENCOUNTER — Ambulatory Visit: Payer: Medicaid Other | Admitting: Pulmonary Disease

## 2021-09-10 ENCOUNTER — Emergency Department (HOSPITAL_COMMUNITY)

## 2021-09-10 ENCOUNTER — Encounter (HOSPITAL_COMMUNITY): Payer: Self-pay

## 2021-09-10 ENCOUNTER — Observation Stay (HOSPITAL_COMMUNITY)
Admission: EM | Admit: 2021-09-10 | Discharge: 2021-09-11 | Disposition: A | Discharge status: Home / Self Care | Attending: Internal Medicine | Admitting: Internal Medicine

## 2021-09-10 ENCOUNTER — Other Ambulatory Visit: Payer: Self-pay

## 2021-09-10 DIAGNOSIS — Z7951 Long term (current) use of inhaled steroids: Secondary | ICD-10-CM | POA: Diagnosis not present

## 2021-09-10 DIAGNOSIS — Z20822 Contact with and (suspected) exposure to covid-19: Secondary | ICD-10-CM | POA: Insufficient documentation

## 2021-09-10 DIAGNOSIS — Z79899 Other long term (current) drug therapy: Secondary | ICD-10-CM | POA: Diagnosis not present

## 2021-09-10 DIAGNOSIS — J45901 Unspecified asthma with (acute) exacerbation: Secondary | ICD-10-CM | POA: Diagnosis present

## 2021-09-10 DIAGNOSIS — F17210 Nicotine dependence, cigarettes, uncomplicated: Secondary | ICD-10-CM | POA: Diagnosis not present

## 2021-09-10 DIAGNOSIS — J4541 Moderate persistent asthma with (acute) exacerbation: Secondary | ICD-10-CM | POA: Diagnosis not present

## 2021-09-10 DIAGNOSIS — R062 Wheezing: Secondary | ICD-10-CM | POA: Diagnosis present

## 2021-09-10 HISTORY — DX: Unspecified asthma, uncomplicated: J45.909

## 2021-09-10 LAB — BASIC METABOLIC PANEL
Anion gap: 9 (ref 5–15)
BUN: 6 mg/dL (ref 6–20)
CO2: 26 mmol/L (ref 22–32)
Calcium: 9.2 mg/dL (ref 8.9–10.3)
Chloride: 103 mmol/L (ref 98–111)
Creatinine, Ser: 0.72 mg/dL (ref 0.44–1.00)
GFR, Estimated: >60 mL/min (ref >60)
Glucose, Bld: 142 mg/dL — ABNORMAL HIGH (ref 70–99)
Potassium: 3.6 mmol/L (ref 3.5–5.1)
Sodium: 138 mmol/L (ref 135–145)

## 2021-09-10 LAB — CBC WITH DIFFERENTIAL/PLATELET
Abs Immature Granulocytes: 0.04 10*3/uL (ref 0.00–0.07)
Basophils Absolute: 0 10*3/uL (ref 0.0–0.1)
Basophils Relative: 0 %
Eosinophils Absolute: 0.8 10*3/uL — ABNORMAL HIGH (ref 0.0–0.5)
Eosinophils Relative: 7 %
HCT: 41.3 % (ref 36.0–46.0)
Hemoglobin: 12.8 g/dL (ref 12.0–15.0)
Immature Granulocytes: 0 %
Lymphocytes Relative: 24 %
Lymphs Abs: 2.5 10*3/uL (ref 0.7–4.0)
MCH: 28.3 pg (ref 26.0–34.0)
MCHC: 31 g/dL (ref 30.0–36.0)
MCV: 91.4 fL (ref 80.0–100.0)
Monocytes Absolute: 0.7 10*3/uL (ref 0.1–1.0)
Monocytes Relative: 6 %
Neutro Abs: 6.7 10*3/uL (ref 1.7–7.7)
Neutrophils Relative %: 63 %
Platelets: 288 10*3/uL (ref 150–400)
RBC: 4.52 MIL/uL (ref 3.87–5.11)
RDW: 14 % (ref 11.5–15.5)
WBC: 10.8 10*3/uL — ABNORMAL HIGH (ref 4.0–10.5)
nRBC: 0 % (ref 0.0–0.2)

## 2021-09-10 LAB — RESP PANEL BY RT-PCR (FLU A&B, COVID) ARPGX2
Influenza A by PCR: NEGATIVE
Influenza B by PCR: NEGATIVE
SARS Coronavirus 2 by RT PCR: NEGATIVE

## 2021-09-10 LAB — BRAIN NATRIURETIC PEPTIDE: B Natriuretic Peptide: 20 pg/mL (ref 0.0–100.0)

## 2021-09-10 LAB — HIV ANTIBODY (ROUTINE TESTING W REFLEX): HIV Screen 4th Generation wRfx: NONREACTIVE

## 2021-09-10 MED ORDER — DILTIAZEM HCL ER COATED BEADS 180 MG PO CP24
180.0000 mg | ORAL_CAPSULE | Freq: Every day | ORAL | Status: DC
Start: 2021-09-10 — End: 2021-09-11
  Administered 2021-09-10 – 2021-09-11 (×2): 180 mg via ORAL
  Filled 2021-09-10 (×2): qty 1

## 2021-09-10 MED ORDER — MAGNESIUM SULFATE 2 GM/50ML IV SOLN
2.0000 g | Freq: Once | INTRAVENOUS | Status: AC
  Administered 2021-09-10: 2 g via INTRAVENOUS
  Filled 2021-09-10: qty 50

## 2021-09-10 MED ORDER — BUDESONIDE 0.25 MG/2ML IN SUSP
0.2500 mg | Freq: Two times a day (BID) | RESPIRATORY_TRACT | Status: DC
  Administered 2021-09-10 – 2021-09-11 (×3): 0.25 mg via RESPIRATORY_TRACT
  Filled 2021-09-10 (×3): qty 2

## 2021-09-10 MED ORDER — GUAIFENESIN-DM 100-10 MG/5ML PO SYRP
5.0000 mL | ORAL_SOLUTION | ORAL | Status: DC | PRN
  Administered 2021-09-10: 5 mL via ORAL
  Filled 2021-09-10: qty 5

## 2021-09-10 MED ORDER — IPRATROPIUM-ALBUTEROL 0.5-2.5 (3) MG/3ML IN SOLN
3.0000 mL | Freq: Once | RESPIRATORY_TRACT | Status: DC
  Filled 2021-09-10: qty 3

## 2021-09-10 MED ORDER — ALBUTEROL SULFATE (2.5 MG/3ML) 0.083% IN NEBU: 3.0000 mL | INHALATION_SOLUTION | RESPIRATORY_TRACT | Status: DC | PRN

## 2021-09-10 MED ORDER — FLUTICASONE PROPIONATE 50 MCG/ACT NA SUSP
1.0000 | Freq: Every day | NASAL | Status: DC
  Administered 2021-09-10 – 2021-09-11 (×2): 1 via NASAL
  Filled 2021-09-10: qty 16

## 2021-09-10 MED ORDER — ENOXAPARIN SODIUM 40 MG/0.4ML IJ SOSY
40.0000 mg | PREFILLED_SYRINGE | INTRAMUSCULAR | Status: DC
  Administered 2021-09-10 – 2021-09-11 (×2): 40 mg via SUBCUTANEOUS
  Filled 2021-09-10 (×2): qty 0.4

## 2021-09-10 MED ORDER — MELATONIN 3 MG PO TABS
6.0000 mg | ORAL_TABLET | Freq: Once | ORAL | Status: AC
Start: 2021-09-11 — End: 2021-09-10
  Administered 2021-09-10: 6 mg via ORAL
  Filled 2021-09-10: qty 2

## 2021-09-10 MED ORDER — MONTELUKAST SODIUM 10 MG PO TABS
10.0000 mg | ORAL_TABLET | Freq: Every day | ORAL | Status: DC
  Administered 2021-09-10: 10 mg via ORAL
  Filled 2021-09-10: qty 1

## 2021-09-10 MED ORDER — METHYLPREDNISOLONE SODIUM SUCC 40 MG IJ SOLR
40.0000 mg | Freq: Two times a day (BID) | INTRAMUSCULAR | Status: DC
Start: 2021-09-10 — End: 2021-09-11
  Administered 2021-09-10 – 2021-09-11 (×3): 40 mg via INTRAVENOUS
  Filled 2021-09-10 (×3): qty 1

## 2021-09-10 MED ORDER — LEVALBUTEROL HCL 0.63 MG/3ML IN NEBU
0.6300 mg | INHALATION_SOLUTION | Freq: Four times a day (QID) | RESPIRATORY_TRACT | Status: DC
Start: 2021-09-10 — End: 2021-09-11
  Administered 2021-09-10 – 2021-09-11 (×3): 0.63 mg via RESPIRATORY_TRACT
  Filled 2021-09-10 (×3): qty 3

## 2021-09-10 MED ORDER — ALBUTEROL SULFATE (2.5 MG/3ML) 0.083% IN NEBU
INHALATION_SOLUTION | RESPIRATORY_TRACT | Status: AC
  Filled 2021-09-10: qty 6

## 2021-09-10 MED ORDER — ALBUTEROL SULFATE HFA 108 (90 BASE) MCG/ACT IN AERS
INHALATION_SPRAY | RESPIRATORY_TRACT | Status: AC
  Filled 2021-09-10: qty 6.7

## 2021-09-10 MED ORDER — ALBUTEROL SULFATE (2.5 MG/3ML) 0.083% IN NEBU
5.0000 mg | INHALATION_SOLUTION | Freq: Once | RESPIRATORY_TRACT | Status: AC
  Administered 2021-09-10: 5 mg via RESPIRATORY_TRACT

## 2021-09-10 MED ORDER — ONDANSETRON HCL 4 MG PO TABS: 4.0000 mg | ORAL_TABLET | Freq: Four times a day (QID) | ORAL | Status: DC | PRN

## 2021-09-10 MED ORDER — ONDANSETRON HCL 4 MG/2ML IJ SOLN: 4.0000 mg | Freq: Four times a day (QID) | INTRAMUSCULAR | Status: DC | PRN

## 2021-09-10 MED ORDER — IPRATROPIUM-ALBUTEROL 0.5-2.5 (3) MG/3ML IN SOLN
3.0000 mL | Freq: Four times a day (QID) | RESPIRATORY_TRACT | Status: DC
  Administered 2021-09-10 (×2): 3 mL via RESPIRATORY_TRACT
  Filled 2021-09-10: qty 3

## 2021-09-10 MED ORDER — ACETAMINOPHEN 325 MG PO TABS: 650.0000 mg | ORAL_TABLET | Freq: Four times a day (QID) | ORAL | Status: DC | PRN

## 2021-09-10 MED ORDER — ACETAMINOPHEN 650 MG RE SUPP: 650.0000 mg | Freq: Four times a day (QID) | RECTAL | Status: DC | PRN

## 2021-09-10 MED ORDER — CYCLOBENZAPRINE HCL 10 MG PO TABS
10.0000 mg | ORAL_TABLET | Freq: Two times a day (BID) | ORAL | Status: DC | PRN
  Administered 2021-09-11: 04:00:00 10 mg via ORAL
  Filled 2021-09-10: qty 1

## 2021-09-10 MED ORDER — ALBUTEROL SULFATE (2.5 MG/3ML) 0.083% IN NEBU
INHALATION_SOLUTION | RESPIRATORY_TRACT | Status: AC
  Filled 2021-09-10: qty 12

## 2021-09-10 MED ORDER — PANTOPRAZOLE SODIUM 40 MG PO TBEC
40.0000 mg | DELAYED_RELEASE_TABLET | Freq: Every day | ORAL | Status: DC
Start: 2021-09-10 — End: 2021-09-11
  Administered 2021-09-10 – 2021-09-11 (×2): 40 mg via ORAL
  Filled 2021-09-10 (×2): qty 1

## 2021-09-10 MED ORDER — ALBUTEROL SULFATE (2.5 MG/3ML) 0.083% IN NEBU
10.0000 mg/h | INHALATION_SOLUTION | RESPIRATORY_TRACT | Status: DC
  Administered 2021-09-10: 10 mg/h via RESPIRATORY_TRACT

## 2021-09-10 NOTE — ED Provider Notes (Signed)
[facility] EMERGENCY DEPARTMENT Provider Note   CSN: [id number] Arrival date & time: 09/10/21  7591     History  Chief Complaint  Patient presents with   Wheezing    Erica [name] is a 46 y.o. female.  Pt is a 46 yo female with a hx of asthma.  She is currently at [facility].  She has had 3 days of sob and wheezing.  She has been getting frequent nebs as well as prednisone.  She had 2 nebs this am without improvement and 60 mg of prednisone given at 0630 without improvement.  She is very sob and unable to give much hx, but the jail has written out what they have been giving.  Pt has not smoked since she's been in jail (>20 days).      Home Medications Prior to Admission medications   Medication Sig Start Date End Date Taking? Authorizing Provider  albuterol (PROVENTIL) (2.5 MG/3ML) 0.083% nebulizer solution Take 3 mLs (2.5 mg total) by nebulization every 4 (four) hours as needed. 08/19/21  Yes Tanda Rockers, MD  cyclobenzaprine (FLEXERIL) 10 MG tablet Take 10 mg by mouth 2 (two) times daily as needed. 04/10/20  Yes [provider]  diltiazem (CARDIZEM CD) 180 MG 24 hr capsule Take 180 mg by mouth daily.   Yes [provider]  famotidine (PEPCID) 20 MG tablet One after supper Patient taking differently: Take 20 mg by mouth at bedtime. 08/19/21  Yes Tanda Rockers, MD  guaiFENesin (ROBITUSSIN) 100 MG/5ML liquid Take 10 mLs by mouth 2 (two) times daily.   Yes [provider]  Ipratropium-Albuterol (COMBIVENT RESPIMAT) 20-100 MCG/ACT AERS respimat Inhale 1 puff into the lungs every 4 (four) hours as needed for wheezing. 08/19/21  Yes Tanda Rockers, MD  ipratropium-albuterol (DUONEB) 0.5-2.5 (3) MG/3ML SOLN Take 3 mLs by nebulization every 4 (four) hours as needed.   Yes [provider]  montelukast (SINGULAIR) 10 MG tablet Take 1 tablet (10 mg total) by mouth at bedtime. 04/22/21  Yes Chesley Mires, MD  pantoprazole (PROTONIX) 40 MG  tablet Take 1 tablet (40 mg total) by mouth daily. Take 30-60 min before first meal of the day 08/19/21  Yes Tanda Rockers, MD  predniSONE (DELTASONE) 10 MG tablet Take  4 each am x 2 days,   2 each am x 2 days,  1 each am x 2 days and stop 08/19/21  Yes Tanda Rockers, MD  buPROPion [facility] SR) 150 MG 12 hr tablet Take '150mg'$  daily x3 days, then '150mg'$  twice daily. Patient needs to set quit date to stop smoking after been on Rx for 7 days. Patient not taking: Reported on 09/10/2021 08/21/20   Chesley Mires, MD  fluticasone [facility]) 50 MCG/ACT nasal spray Place 1 spray into both nostrils daily. Patient not taking: Reported on 09/10/2021 02/05/21   Chesley Mires, MD      Allergies    Strawberry (diagnostic)    Review of Systems   Review of Systems  Respiratory:  Positive for cough, shortness of breath and wheezing.   All other systems reviewed and are negative.  Physical Exam Updated Vital Signs BP 128/84    Pulse (!) 105    Temp 98.2 F (36.8 C) (Oral)    Resp (!) 22    Ht 5' (1.524 m)    Wt 90.7 kg    LMP 06/17/2013    SpO2 100%    BMI 39.06 kg/m  Physical Exam Vitals and  nursing note reviewed.  Constitutional:      General: She is in acute distress.     Appearance: Normal appearance.  HENT:     Head: Normocephalic and atraumatic.     Right Ear: External ear normal.     Left Ear: External ear normal.     Nose: Nose normal.     Mouth/Throat:     Mouth: Mucous membranes are moist.     Pharynx: Oropharynx is clear.  Eyes:     Extraocular Movements: Extraocular movements intact.     Conjunctiva/sclera: Conjunctivae normal.     Pupils: Pupils are equal, round, and reactive to light.  Cardiovascular:     Rate and Rhythm: Regular rhythm. Tachycardia present.     Pulses: Normal pulses.     Heart sounds: Normal heart sounds.  Pulmonary:     Effort: Respiratory distress present.     Breath sounds: Wheezing present.  Abdominal:     General: Abdomen is flat. Bowel sounds are normal.      Palpations: Abdomen is soft.  Musculoskeletal:        General: Normal range of motion.     Cervical back: Normal range of motion and neck supple.  Skin:    General: Skin is warm.     Capillary Refill: Capillary refill takes less than 2 seconds.  Neurological:     General: No focal deficit present.     Mental Status: She is alert and oriented to person, place, and time.  Psychiatric:        Mood and Affect: Mood normal.        Behavior: Behavior normal.    ED Results / Procedures / Treatments   Labs (all labs ordered are listed, but only abnormal results are displayed) Labs Reviewed  BASIC METABOLIC PANEL - Abnormal; Notable for the following components:      Result Value   Glucose, Bld 142 (*)    All other components within normal limits  CBC WITH DIFFERENTIAL/PLATELET - Abnormal; Notable for the following components:   WBC 10.8 (*)    Eosinophils Absolute 0.8 (*)    All other components within normal limits  RESP PANEL BY RT-PCR (FLU A&B, COVID) ARPGX2  BRAIN NATRIURETIC PEPTIDE    EKG None  Radiology DG Chest Port 1 View  Result Date: 09/10/2021 CLINICAL DATA:  Shortness of breath. EXAM: PORTABLE CHEST 1 VIEW COMPARISON:  04/22/2021 FINDINGS: Artifact from EKG leads. Normal heart size and mediastinal contours. No acute infiltrate or edema. No effusion or pneumothorax. No acute osseous findings. IMPRESSION: No evidence of active disease. Electronically Signed   By: Jorje Guild M.D.   On: 09/10/2021 08:04    Procedures Procedures    Medications Ordered in ED Medications  albuterol (PROVENTIL) (2.5 MG/3ML) 0.083% nebulizer solution (0 mg  Hold 09/10/21 0725)  albuterol (PROVENTIL) (2.5 MG/3ML) 0.083% nebulizer solution (10 mg/hr Nebulization New Bag/Given 09/10/21 0741)  albuterol (PROVENTIL) (2.5 MG/3ML) 0.083% nebulizer solution (  Not Given 09/10/21 0741)  ipratropium-albuterol (DUONEB) 0.5-2.5 (3) MG/3ML nebulizer solution 3 mL (has no administration in time range)   albuterol (PROVENTIL) (2.5 MG/3ML) 0.083% nebulizer solution 5 mg (5 mg Nebulization Given 09/10/21 0725)  magnesium sulfate IVPB 2 g 50 mL (0 g Intravenous Stopped 09/10/21 0911)    ED Course/ Medical Decision Making/ A&P                           Medical Decision Making Amount  and/or Complexity of Data Reviewed Labs: ordered. Radiology: ordered.  Risk Prescription drug management. Decision regarding hospitalization.   This patient presents to the ED for concern of sob, this involves an extensive number of treatment options, and is a complaint that carries with it a high risk of complications and morbidity.  The differential diagnosis includes asthma exac, covid/flu, pna, uri   Co morbidities that complicate the patient evaluation  asthma   Additional history obtained:  Additional history obtained from epic chart review External records from outside source obtained and reviewed including jail officer and jail records   Lab Tests:  I Ordered, and personally interpreted labs.  The pertinent results include:  bmp is nl, cbc nl, covid/flu neg, bnp nl   Imaging Studies ordered:  I ordered imaging studies including CXR  I independently visualized and interpreted imaging which showed    IMPRESSION:  No evidence of active disease.      I agree with the radiologist interpretation   Cardiac Monitoring:  The patient was maintained on a cardiac monitor.  I personally viewed and interpreted the cardiac monitored which showed an underlying rhythm of: sinus tachy   Medicines ordered and prescription drug management:  I ordered medication including continuous neb, iv mag  for sob  Reevaluation of the patient after these medicines showed that the patient improved I have reviewed the patients home medicines and have made adjustments as needed   Test Considered:  Ct chest.  However, pt has a hx of asthma and this feels like her asthma   Critical  Interventions:  nebs   Consultations Obtained:  I requested consultation with the hsopitalist,  and discussed lab and imaging findings as well as pertinent plan - they will admit   Problem List / ED Course:  Asthma exac:  pt improved after continuous nebs.  However, she is still wheezing.  Additional nebs ordered.  Prednisone given pta.   Reevaluation:  After the interventions noted above, I reevaluated the patient and found that they have :improved   Social Determinants of Health:  In jail   Dispostion:  After consideration of the diagnostic results and the patients response to treatment, I feel that the patent would benefit from admission.    CRITICAL CARE Performed by: Isla Pence   Total critical care time: 30 minutes  Critical care time was exclusive of separately billable procedures and treating other patients.  Critical care was necessary to treat or prevent imminent or life-threatening deterioration.  Critical care was time spent personally by me on the following activities: development of treatment plan with patient and/or surrogate as well as nursing, discussions with consultants, evaluation of patient's response to treatment, examination of patient, obtaining history from patient or surrogate, ordering and performing treatments and interventions, ordering and review of laboratory studies, ordering and review of radiographic studies, pulse oximetry and re-evaluation of patient's condition.         Final Clinical Impression(s) / ED Diagnoses Final diagnoses:  Moderate persistent asthma with exacerbation    Rx / DC Orders ED Discharge Orders     None         Isla Pence, MD 09/10/21 [phone]

## 2021-09-10 NOTE — Assessment & Plan Note (Signed)
Continue scheduled breathing treatments ?IV Solu-Medrol twice daily ?Pulmicort twice daily ?Hopeful improvement in the next 1-2 days ?

## 2021-09-10 NOTE — ED Notes (Signed)
Pt coughing after standing to self swab. Pt took 2 puffs of inhaler.  ?Resp called to admin nebs.  ?

## 2021-09-10 NOTE — ED Notes (Signed)
Pt ambulated to bathroom and became Sob and wheezing. Pt in bed with cool wash clothes on attempting to relax. Oxygen 96%  ?

## 2021-09-10 NOTE — ED Triage Notes (Addendum)
Pt arrived from [facility] with officer c/o wheezing for 3 days but this extreme this morning. 3am neb tx  x2 without effectiveness, rescue inhaler non effective. Prednisone '60mg'$  given at 6am.  ?

## 2021-09-10 NOTE — ED Notes (Signed)
Respiratory at bedside.

## 2021-09-10 NOTE — ED Notes (Signed)
Pt verbalized vaginal greenish discharge and odor and requesting pelvic exam or STI check. Dr. Darlyn Chamber via chat.  ?

## 2021-09-10 NOTE — H&P (Signed)
?History and Physical  ? ? ?Patient: Erica [name] QQV:[id number] DOB: April 18, 1976 ?DOA: 09/10/2021 ?DOS: the patient was seen and examined on 09/10/2021 ?PCP: System, Provider Not In  ?Patient coming from:  [place] ? ?Chief Complaint:  ?Chief Complaint  ?Patient presents with  ? Wheezing  ? ?HPI: Erica [name] is a 46 y.o. female with medical history significant of asthma who complains of shortness of breath and wheezing over the last 3 days.  She has been getting frequent nebulizers as well as prednisone from [facility] where she has been over the last several days.  She had 2 nebulizer treatments this morning without improvement as well as 60 mg of prednisone without much improvement and therefore she was brought to the ED for further evaluation.  Patient has had recent history of tobacco abuse, but has not been smoking since she has been in jail over the last 20 days.  She denies any fevers, chills, chest pain, or productive cough.  No sick contacts noted. ? ?In the ED, she has been given nebulizer treatments and remains on room air, but continues to have wheezing.  Chest x-ray with no acute findings and COVID and flu testing negative.  She is noted to have mildly elevated WBC count. ? ?Review of Systems: As mentioned in the history of present illness. All other systems reviewed and are negative. ?Past Medical History:  ?Diagnosis Date  ? Asthma   ? Pelvic inflammatory disease   ? Tubo-ovarian abscess   ? ?Past Surgical History:  ?Procedure Laterality Date  ? ABDOMINAL HYSTERECTOMY N/A 07/07/2013  ? Procedure: HYSTERECTOMY ABDOMINAL with bilateral salpingo-oophorectomy;  Surgeon: Woodroe Mode, MD;  Location: [facility];  Service: Gynecology;  Laterality: N/A;  ? CESAREAN SECTION    ? DILATION AND EVACUATION N/A 02/03/2013  ? Procedure: DILATATION AND EVACUATION;  Surgeon: Woodroe Mode, MD;  Location: [facility];  Service: Gynecology;  Laterality: N/A;  ? ?Social History:  reports that she has been smoking  cigarettes. She uses smokeless tobacco. She reports current alcohol use. She reports that she does not use drugs. ? ?Allergies  ?Allergen Reactions  ? Strawberry (Diagnostic) Swelling  ? ? ?Family History  ?Problem Relation Age of Onset  ? Hypertension Mother   ? ? ?Prior to Admission medications   ?Medication Sig Start Date End Date Taking? Authorizing Provider  ?albuterol (PROVENTIL) (2.5 MG/3ML) 0.083% nebulizer solution Take 3 mLs (2.5 mg total) by nebulization every 4 (four) hours as needed. 08/19/21  Yes Tanda Rockers, MD  ?cyclobenzaprine (FLEXERIL) 10 MG tablet Take 10 mg by mouth 2 (two) times daily as needed. 04/10/20  Yes [provider]  ?diltiazem (CARDIZEM CD) 180 MG 24 hr capsule Take 180 mg by mouth daily.   Yes [provider]  ?famotidine (PEPCID) 20 MG tablet One after supper ?Patient taking differently: Take 20 mg by mouth at bedtime. 08/19/21  Yes Tanda Rockers, MD  ?guaiFENesin (ROBITUSSIN) 100 MG/5ML liquid Take 10 mLs by mouth 2 (two) times daily.   Yes [provider]  ?Ipratropium-Albuterol (COMBIVENT RESPIMAT) 20-100 MCG/ACT AERS respimat Inhale 1 puff into the lungs every 4 (four) hours as needed for wheezing. 08/19/21  Yes Tanda Rockers, MD  ?ipratropium-albuterol (DUONEB) 0.5-2.5 (3) MG/3ML SOLN Take 3 mLs by nebulization every 4 (four) hours as needed.   Yes [provider]  ?montelukast (SINGULAIR) 10 MG tablet Take 1 tablet (10 mg total) by mouth at bedtime. 04/22/21  Yes Chesley Mires, MD  ?pantoprazole (  PROTONIX) 40 MG tablet Take 1 tablet (40 mg total) by mouth daily. Take 30-60 min before first meal of the day 08/19/21  Yes Tanda Rockers, MD  ?predniSONE (DELTASONE) 10 MG tablet Take  4 each am x 2 days,   2 each am x 2 days,  1 each am x 2 days and stop 08/19/21  Yes Tanda Rockers, MD  ?buPROPion [facility] SR) 150 MG 12 hr tablet Take '150mg'$  daily x3 days, then '150mg'$  twice daily. Patient needs to set quit date to stop smoking after been on  Rx for 7 days. ?Patient not taking: Reported on 09/10/2021 08/21/20   Chesley Mires, MD  ?fluticasone (FLONASE) 50 MCG/ACT nasal spray Place 1 spray into both nostrils daily. ?Patient not taking: Reported on 09/10/2021 02/05/21   Chesley Mires, MD  ? ? ?Physical Exam: ?Vitals:  ? 09/10/21 0830 09/10/21 0900 09/10/21 0930 09/10/21 1000  ?BP: (!) 132/92 128/84 (!) 125/91 (!) 130/91  ?Pulse: (!) 108 (!) 105 (!) 106 (!) 109  ?Resp: 19 (!) 22 (!) 22 (!) 21  ?Temp:      ?TempSrc:      ?SpO2: 100% 100% 100% 96%  ?Weight:      ?Height:      ? ?General exam: Appears calm and comfortable ?Respiratory system: Bilateral wheezing noted.  Respiratory effort normal.  Currently on room air. ?Cardiovascular system: S1 & S2 heard, RRR.  ?Gastrointestinal system: Abdomen is soft ?Central nervous system: Alert and awake ?Extremities: No edema ?Skin: No significant lesions noted ?Psychiatry: Flat affect. ? ?Data Reviewed: ? ?Chest x-ray with no acute findings. ? ?Assessment and Plan: ?* Acute asthma exacerbation ?Continue scheduled breathing treatments ?IV Solu-Medrol twice daily ?Pulmicort twice daily ?Hopeful improvement in the next 1-2 days ? ?Cigarette smoker ?Patient has quit smoking in the last 20 days since incarceration ?Counseled on remaining off cigarettes on discharge ? ? ? Advance Care Planning: Full ? ?Consults: None ? ?Family Communication: None at bedside ? ?Severity of Illness: ?The appropriate patient status for this patient is OBSERVATION. Observation status is judged to be reasonable and necessary in order to provide the required intensity of service to ensure the patient's safety. The patient's presenting symptoms, physical exam findings, and initial radiographic and laboratory data in the context of their medical condition is felt to place them at decreased risk for further clinical deterioration. Furthermore, it is anticipated that the patient will be medically stable for discharge from the hospital within 2 midnights of  admission.  ? ?Author: ?Rodena Goldmann, DO ?09/10/2021 10:20 AM ? ?For on call review [email or web address].  ?

## 2021-09-10 NOTE — Assessment & Plan Note (Signed)
Patient has quit smoking in the last 20 days since incarceration ?Counseled on remaining off cigarettes on discharge ?

## 2021-09-10 NOTE — ED Notes (Signed)
Pt has been released from jail for time served per officer. Officer brought pt belongings and pt returned jail jumpsuit.  ?

## 2021-09-11 DIAGNOSIS — J45901 Unspecified asthma with (acute) exacerbation: Secondary | ICD-10-CM | POA: Diagnosis not present

## 2021-09-11 LAB — GC/CHLAMYDIA PROBE AMP (~~LOC~~) NOT AT ARMC
Chlamydia: NEGATIVE
Comment: NEGATIVE
Comment: NORMAL
Neisseria Gonorrhea: NEGATIVE

## 2021-09-11 MED ORDER — PREDNISONE 10 MG PO TABS: 40.0000 mg | ORAL_TABLET | Freq: Every day | ORAL | 0 refills | Status: AC

## 2021-09-11 MED ORDER — AZITHROMYCIN 500 MG PO TABS
1250.0000 mg | ORAL_TABLET | Freq: Once | ORAL | Status: AC
  Administered 2021-09-11: 12:00:00 1250 mg via ORAL
  Filled 2021-09-11: qty 2.5

## 2021-09-11 MED ORDER — GUAIFENESIN-DM 100-10 MG/5ML PO SYRP: 5.0000 mL | ORAL_SOLUTION | ORAL | 0 refills | Status: DC | PRN

## 2021-09-11 MED ORDER — FLUTICASONE-SALMETEROL 500-50 MCG/ACT IN AEPB: 1.0000 | INHALATION_SPRAY | Freq: Two times a day (BID) | RESPIRATORY_TRACT | 1 refills | Status: DC

## 2021-09-11 NOTE — Discharge Summary (Signed)
Physician Discharge Summary  Erica [name] HYW:[id number] DOB: October 16, 1975 DOA: 09/10/2021  PCP: System, Provider Not In  Admit date: 09/10/2021  Discharge date: 09/11/2021  Admitted From:Jail  Disposition:  Jail  Recommendations for Outpatient Follow-up:  Follow up with PCP in 1-2 weeks Follow-up with pulmonology in the next 1-2 weeks Remain on prednisone as prescribed for the next 5 days Continue breathing treatments as needed for shortness of breath or wheezing Continue on Advair as represcribed twice daily  Home Health: None  Equipment/Devices: None  Discharge Condition:Stable  CODE STATUS: Full  Diet recommendation: Regular  Brief/Interim Summary: Per HPI: Erica [name] is a 46 y.o. female with medical history significant of asthma who complains of shortness of breath and wheezing over the last 3 days.  She has been getting frequent nebulizers as well as prednisone from [facility] where she has been over the last several days.  She had 2 nebulizer treatments this morning without improvement as well as 60 mg of prednisone without much improvement and therefore she was brought to the ED for further evaluation.  Patient has had recent history of tobacco abuse, but has not been smoking since she has been in jail over the last 20 days.  She denies any fevers, chills, chest pain, or productive cough.  No sick contacts noted.   In the ED, she has been given nebulizer treatments and remains on room air, but continues to have wheezing.  Chest x-ray with no acute findings and COVID and flu testing negative.  She is noted to have mildly elevated WBC count.  -Patient was admitted for acute asthma exacerbation which has now improved significantly after use of IV steroids, inhaled steroids, and breathing treatments.  She is in stable condition for discharge today and no further significant symptoms.  She was briefly requiring nasal cannula oxygen and is no longer requiring this at  the moment.  She will be resumed on Advair as previously prescribed by her pulmonologist to assist with symptom control and hopefully decrease the frequency of her exacerbations.  She has been encouraged to follow-up in the near future.  She has also been counseled on smoking cessation.  Discharge Diagnoses:  Principal Problem:   Acute asthma exacerbation Active Problems:   Cigarette smoker  Principal discharge diagnosis: Acute asthma exacerbation.  Discharge Instructions  Discharge Instructions     Ambulatory referral to Pulmonology   Complete by: As directed    Reason for referral: Asthma/COPD   Diet - low sodium heart healthy   Complete by: As directed    Increase activity slowly   Complete by: As directed       Allergies as of 09/11/2021       Reactions   Strawberry (diagnostic) Swelling        Medication List     STOP taking these medications    buPROPion 150 MG 12 hr tablet Commonly known as: WELLBUTRIN SR       TAKE these medications    albuterol (2.5 MG/3ML) 0.083% nebulizer solution Commonly known as: PROVENTIL Take 3 mLs (2.5 mg total) by nebulization every 4 (four) hours as needed.   ipratropium-albuterol 0.5-2.5 (3) MG/3ML Soln Commonly known as: DUONEB Take 3 mLs by nebulization every 4 (four) hours as needed.   Combivent Respimat 20-100 MCG/ACT Aers respimat Generic drug: Ipratropium-Albuterol Inhale 1 puff into the lungs every 4 (four) hours as needed for wheezing.   cyclobenzaprine 10 MG tablet Commonly known as: FLEXERIL Take 10 mg by mouth 2 (  two) times daily as needed.   diltiazem 180 MG 24 hr capsule Commonly known as: CARDIZEM CD Take 180 mg by mouth daily.   famotidine 20 MG tablet Commonly known as: Pepcid One after supper What changed:  how much to take how to take this when to take this additional instructions   fluticasone 50 MCG/ACT nasal spray Commonly known as: FLONASE Place 1 spray into both nostrils daily.    fluticasone-salmeterol 500-50 MCG/ACT Aepb Commonly known as: Advair Diskus Inhale 1 puff into the lungs in the morning and at bedtime.   guaiFENesin 100 MG/5ML liquid Commonly known as: ROBITUSSIN Take 10 mLs by mouth 2 (two) times daily.   guaiFENesin-dextromethorphan 100-10 MG/5ML syrup Commonly known as: ROBITUSSIN DM Take 5 mLs by mouth every 4 (four) hours as needed for cough.   montelukast 10 MG tablet Commonly known as: SINGULAIR Take 1 tablet (10 mg total) by mouth at bedtime.   pantoprazole 40 MG tablet Commonly known as: Protonix Take 1 tablet (40 mg total) by mouth daily. Take 30-60 min before first meal of the day   predniSONE 10 MG tablet Commonly known as: DELTASONE Take 4 tablets (40 mg total) by mouth daily for 5 days. What changed:  how much to take how to take this when to take this additional instructions        Follow-up Information     Tanda Rockers, MD. Schedule an appointment as soon as possible for a visit in 1 week(s).   Specialty: Pulmonary Disease Contact information: [place] [id number] [phone]                Allergies  Allergen Reactions   Strawberry (Diagnostic) Swelling    Consultations: None   Procedures/Studies: DG Chest Port 1 View  Result Date: 09/10/2021 CLINICAL DATA:  Shortness of breath. EXAM: PORTABLE CHEST 1 VIEW COMPARISON:  04/22/2021 FINDINGS: Artifact from EKG leads. Normal heart size and mediastinal contours. No acute infiltrate or edema. No effusion or pneumothorax. No acute osseous findings. IMPRESSION: No evidence of active disease. Electronically Signed   By: Jorje Guild M.D.   On: 09/10/2021 08:04     Discharge Exam: Vitals:   09/11/21 0530 09/11/21 0823  BP: 123/73   Pulse: (!) 104   Resp: 19   Temp: 98 F (36.7 C)   SpO2: 97% 96%   Vitals:   09/11/21 0204 09/11/21 0218 09/11/21 0530 09/11/21 0823  BP:  112/79 123/73   Pulse:  (!) 110 (!) 104    Resp:  20 19   Temp:  97.9 F (36.6 C) 98 F (36.7 C)   TempSrc:      SpO2: 95% 95% 97% 96%  Weight:      Height:        General: Pt is alert, awake, not in acute distress Cardiovascular: RRR, S1/S2 +, no rubs, no gallops Respiratory: CTA bilaterally, no wheezing, no rhonchi Abdominal: Soft, NT, ND, bowel sounds + Extremities: no edema, no cyanosis    The results of significant diagnostics from this hospitalization (including imaging, microbiology, ancillary and laboratory) are listed below for reference.     Microbiology: Recent Results (from the past 240 hour(s))  Resp Panel by RT-PCR (Flu A&B, Covid) Nasopharyngeal Swab     Status: None   Collection Time: 09/10/21  8:05 AM   Specimen: Nasopharyngeal Swab; Nasopharyngeal(NP) swabs in vial transport medium  Result Value Ref Range Status   SARS Coronavirus 2 by RT  PCR NEGATIVE NEGATIVE Final    Comment: (NOTE) SARS-CoV-2 target nucleic acids are NOT DETECTED.  The SARS-CoV-2 RNA is generally detectable in upper respiratory specimens during the acute phase of infection. The lowest concentration of SARS-CoV-2 viral copies this assay can detect is 138 copies/mL. A negative result does not preclude SARS-Cov-2 infection and should not be used as the sole basis for treatment or other patient management decisions. A negative result may occur with  improper specimen collection/handling, submission of specimen other than nasopharyngeal swab, presence of viral mutation(s) within the areas targeted by this assay, and inadequate number of viral copies(<138 copies/mL). A negative result must be combined with clinical observations, patient history, and epidemiological information. The expected result is Negative.  Fact Sheet for Patients:  EntrepreneurPulse.com.au  Fact Sheet for Healthcare Providers:  IncredibleEmployment.be  This test is no t yet approved or cleared by the [place] FDA  and  has been authorized for detection and/or diagnosis of SARS-CoV-2 by FDA under an Emergency Use Authorization (EUA). This EUA will remain  in effect (meaning this test can be used) for the duration of the COVID-19 declaration under Section 564(b)(1) of the Act, 21 U.S.C.section 360bbb-3(b)(1), unless the authorization is terminated  or revoked sooner.       Influenza A by PCR NEGATIVE NEGATIVE Final   Influenza B by PCR NEGATIVE NEGATIVE Final    Comment: (NOTE) The Xpert Xpress SARS-CoV-2/FLU/RSV plus assay is intended as an aid in the diagnosis of influenza from Nasopharyngeal swab specimens and should not be used as a sole basis for treatment. Nasal washings and aspirates are unacceptable for Xpert Xpress SARS-CoV-2/FLU/RSV testing.  Fact Sheet for Patients: EntrepreneurPulse.com.au  Fact Sheet for Healthcare Providers: IncredibleEmployment.be  This test is not yet approved or cleared by the [place] FDA and has been authorized for detection and/or diagnosis of SARS-CoV-2 by FDA under an Emergency Use Authorization (EUA). This EUA will remain in effect (meaning this test can be used) for the duration of the COVID-19 declaration under Section 564(b)(1) of the Act, 21 U.S.C. section 360bbb-3(b)(1), unless the authorization is terminated or revoked.  Performed at [facility], [address], [place], [place] [address]      Labs: BNP (last 3 results) Recent Labs    09/10/21 0734  BNP 19.1   Basic Metabolic Panel: Recent Labs  Lab 09/10/21 0734  NA 138  K 3.6  CL 103  CO2 26  GLUCOSE 142*  BUN 6  CREATININE 0.72  CALCIUM 9.2   Liver Function Tests: No results for input(s): AST, ALT, ALKPHOS, BILITOT, PROT, ALBUMIN in the last 168 hours. No results for input(s): LIPASE, AMYLASE in the last 168 hours. No results for input(s): AMMONIA in the last 168 hours. CBC: Recent Labs  Lab 09/10/21 0734  WBC 10.8*   NEUTROABS 6.7  HGB 12.8  HCT 41.3  MCV 91.4  PLT 288   Cardiac Enzymes: No results for input(s): CKTOTAL, CKMB, CKMBINDEX, TROPONINI in the last 168 hours. BNP: Invalid input(s): POCBNP CBG: No results for input(s): GLUCAP in the last 168 hours. D-Dimer No results for input(s): DDIMER in the last 72 hours. Hgb A1c No results for input(s): HGBA1C in the last 72 hours. Lipid Profile No results for input(s): CHOL, HDL, LDLCALC, TRIG, CHOLHDL, LDLDIRECT in the last 72 hours. Thyroid function studies No results for input(s): TSH, T4TOTAL, T3FREE, THYROIDAB in the last 72 hours.  Invalid input(s): FREET3 Anemia work up No results for input(s): VITAMINB12, FOLATE, FERRITIN, TIBC,  IRON, RETICCTPCT in the last 72 hours. Urinalysis    Component Value Date/Time   COLORURINE YELLOW 04/18/2013 2236   APPEARANCEUR CLOUDY (A) 04/18/2013 2236   LABSPEC <=1.005 07/03/2014 0858   PHURINE 5.0 07/03/2014 0858   GLUCOSEU NEGATIVE 07/03/2014 0858   HGBUR SMALL (A) 07/03/2014 0858   BILIRUBINUR NEGATIVE 07/03/2014 0858   KETONESUR NEGATIVE 07/03/2014 0858   PROTEINUR NEGATIVE 07/03/2014 0858   UROBILINOGEN 0.2 07/03/2014 0858   NITRITE NEGATIVE 07/03/2014 0858   LEUKOCYTESUR NEGATIVE 07/03/2014 0858   Sepsis Labs Invalid input(s): PROCALCITONIN,  WBC,  LACTICIDVEN Microbiology Recent Results (from the past 240 hour(s))  Resp Panel by RT-PCR (Flu A&B, Covid) Nasopharyngeal Swab     Status: None   Collection Time: 09/10/21  8:05 AM   Specimen: Nasopharyngeal Swab; Nasopharyngeal(NP) swabs in vial transport medium  Result Value Ref Range Status   SARS Coronavirus 2 by RT PCR NEGATIVE NEGATIVE Final    Comment: (NOTE) SARS-CoV-2 target nucleic acids are NOT DETECTED.  The SARS-CoV-2 RNA is generally detectable in upper respiratory specimens during the acute phase of infection. The lowest concentration of SARS-CoV-2 viral copies this assay can detect is 138 copies/mL. A negative result  does not preclude SARS-Cov-2 infection and should not be used as the sole basis for treatment or other patient management decisions. A negative result may occur with  improper specimen collection/handling, submission of specimen other than nasopharyngeal swab, presence of viral mutation(s) within the areas targeted by this assay, and inadequate number of viral copies(<138 copies/mL). A negative result must be combined with clinical observations, patient history, and epidemiological information. The expected result is Negative.  Fact Sheet for Patients:  EntrepreneurPulse.com.au  Fact Sheet for Healthcare Providers:  IncredibleEmployment.be  This test is no t yet approved or cleared by the [place] FDA and  has been authorized for detection and/or diagnosis of SARS-CoV-2 by FDA under an Emergency Use Authorization (EUA). This EUA will remain  in effect (meaning this test can be used) for the duration of the COVID-19 declaration under Section 564(b)(1) of the Act, 21 U.S.C.section 360bbb-3(b)(1), unless the authorization is terminated  or revoked sooner.       Influenza A by PCR NEGATIVE NEGATIVE Final   Influenza B by PCR NEGATIVE NEGATIVE Final    Comment: (NOTE) The Xpert Xpress SARS-CoV-2/FLU/RSV plus assay is intended as an aid in the diagnosis of influenza from Nasopharyngeal swab specimens and should not be used as a sole basis for treatment. Nasal washings and aspirates are unacceptable for Xpert Xpress SARS-CoV-2/FLU/RSV testing.  Fact Sheet for Patients: EntrepreneurPulse.com.au  Fact Sheet for Healthcare Providers: IncredibleEmployment.be  This test is not yet approved or cleared by the [place] FDA and has been authorized for detection and/or diagnosis of SARS-CoV-2 by FDA under an Emergency Use Authorization (EUA). This EUA will remain in effect (meaning this test can be used) for  the duration of the COVID-19 declaration under Section 564(b)(1) of the Act, 21 U.S.C. section 360bbb-3(b)(1), unless the authorization is terminated or revoked.  Performed at [facility], [address], [place], [place] [address]      Time coordinating discharge: 35 minutes  SIGNED:   Rodena Goldmann, DO [facility] 09/11/2021, 10:48 AM  If 7PM-7AM, please contact night-coverage [email or web address]

## 2021-09-11 NOTE — Progress Notes (Signed)
Patient has been stable on her 3 liters of oxygen tonight. Patient is still short of breath, but able to ambulate to the bathroom. No others complaints tonight.  ?

## 2021-09-11 NOTE — Progress Notes (Signed)
?  Transition of Care (TOC) Screening Note ? ? ?Patient Details  ?Name: Erica [name] ?Date of Birth: 07/02/1976 ? ? ?Transition of Care (TOC) CM/SW Contact:    ?Arieon Scalzo D, LCSW ?Phone Number: ?09/11/2021, 10:48 AM ? ? ? ?Transition of Care Department [facility]) has reviewed patient and no TOC needs have been identified at this time. We will continue to monitor patient advancement through interdisciplinary progression rounds. If new patient transition needs arise, please place a TOC consult. ?  ?Adaleen Hulgan, Clydene Pugh, LCSW  ?

## 2021-09-18 ENCOUNTER — Telehealth: Payer: Self-pay | Admitting: Pulmonary Disease

## 2021-09-18 ENCOUNTER — Other Ambulatory Visit: Payer: Self-pay

## 2021-09-18 ENCOUNTER — Encounter: Payer: Self-pay | Admitting: Internal Medicine

## 2021-09-18 ENCOUNTER — Ambulatory Visit (INDEPENDENT_AMBULATORY_CARE_PROVIDER_SITE_OTHER): Payer: Medicaid Other | Admitting: Internal Medicine

## 2021-09-18 DIAGNOSIS — J4551 Severe persistent asthma with (acute) exacerbation: Secondary | ICD-10-CM

## 2021-09-18 DIAGNOSIS — F17210 Nicotine dependence, cigarettes, uncomplicated: Secondary | ICD-10-CM | POA: Diagnosis not present

## 2021-09-18 MED ORDER — PREDNISONE 10 MG PO TABS: ORAL_TABLET | ORAL | 0 refills | Status: DC

## 2021-09-18 NOTE — Telephone Encounter (Signed)
Called and scheduled pt for HFU 3:00. She is aware to bring all meds and that appt is in [facility] office  ?

## 2021-09-18 NOTE — Telephone Encounter (Signed)
Dr. Melvyn Novas please advise if youre okay seeing patient today at 3:00 if patient is able to come in for a hosp f/u.  ?

## 2021-09-18 NOTE — Telephone Encounter (Signed)
Yes,  bring all meds  ?

## 2021-09-18 NOTE — Telephone Encounter (Signed)
Asked about seeing Dr. Melvyn Novas since she seen him last visit.  Suggested appt with APP, but pt declined.  Please advise.  ?

## 2021-09-18 NOTE — Patient Instructions (Addendum)
Pantoprazole (protonix) 40 mg   Take  30-60 min before first meal of the day and Pepcid (famotidine)  20 mg after supper until return to office - this is the best way to tell whether stomach acid is contributing to your problem.   ? ?GERD (REFLUX)  is an extremely common cause of respiratory symptoms just like yours , many times with no obvious heartburn at all.  ? ? It can be treated with medication, but also with lifestyle changes including elevation of the head of your bed (ideally with 6 -8inch blocks under the headboard of your bed),  Smoking cessation, avoidance of late meals, excessive alcohol, and avoid fatty foods, chocolate, peppermint, colas, red wine, and acidic juices such as orange juice.  ?NO MINT OR MENTHOL PRODUCTS SO NO COUGH DROPS  ?USE SUGARLESS CANDY INSTEAD (Jolley ranchers or Stover's or Life Savers) or even ice chips will also do - the key is to swallow to prevent all throat clearing. ?NO OIL BASED VITAMINS - use powdered substitutes.  Avoid fish oil when coughing.  ? ?Plan A = Automatic = Always=    Stiolto 2 puffs each am  ? ?Plan B = Backup (to supplement plan A, not to replace it) ?Only use your combivent  inhaler as a rescue medication to be used if you can't catch your breath by resting or doing a relaxed purse lip breathing pattern.  ?- The less you use it, the better it will work when you need it. ?- Ok to use the inhaler  up to   every 4 hours if you must but call for appointment if use goes up over your usual need ?- Don't leave home without it !!  (think of it like the spare tire for your car)  ? ?Plan C = Crisis (instead of Plan B but only if Plan B stops working) ?- only use your albuterol nebulizer if you first try Plan B and it fails to help > ok to use the nebulizer up to every 4 hours but if start needing it regularly call for immediate appointment ? ?For cough mucinex dm 1200 mg every 12 hours and supplement with tramadol 50 mg  1 every 4 hours ? ?Prednisone 10 mg take  4  each am x 2 days,   2 each am x 2 days,  1 each am x 2 days and stop  ? ?The key is to stop smoking completely before smoking completely stops you! ?  ?  ?Please schedule a follow up office visit in 4 weeks, sooner if needed  ? ? ? ? ? ? ? ? ?    ?

## 2021-09-18 NOTE — Progress Notes (Signed)
? ?Erica [name], female    DOB: Sep 28, 1975,    MRN: [id number] ? ? ?Brief patient profile:  ?46   yobf  CNA/ medication dispendser still smoker referred to pulmonary clinic in [place]  08/19/2021 by Dr Halford Chessman for acute flare onset ? Dec 2022  no better with prednisone some better with combivent  but thoroughly confused with details of care so referred for acute evaluation.  ? ? ? ? ?History of Present Illness  ?08/19/2021  Pulmonary/ Acute office eval/ [name] / [place] [facility] not on maint r (was on advair stopped a week prior to OV  "not working' ) ?Chief Complaint  ?Patient presents with  ? Acute Visit  ?  Acute patient using rescue inhaler every 3-4 hours. Wheezing, coughing, SOB  ? ?Wants to talk about nebulizer medications  ? ?Started after proair was Dc'd.  ?Dyspnea:  50 ft x sev months  ?Cough: slt greenish, worse p supper assoc nasal congestion  ?Sleep: waking up with cough / bed is flat 2 pillows  ?SABA use: way too much  ?Rec ?Albuterol 2.5 mg with Ipatropium four times daily per neb ?Only use your combivent as a rescue medication  ?Prednisone 10 mg take  4 each am x 2 days,   2 each am x 2 days,  1 each am x 2 days and stop  ?Augmentin 875 mg take one pill twice daily  X 10 days  ?Take mucinex dm 1200 mg  every 12 hours and supplement if needed with  tramadol 50 mg up to 1-2 every 4 hours  ?Please schedule a follow up office visit in 1 week  sooner if needed  with all medications /inhalers/ solutions in hand so we can verify exactly what you are taking. This includes all medications from all doctors and over the counters  ?If worse in meantime go to ER ? ?08/22/21 went to jail >  above continued x neb twice daily and no tramadol > cough much worse > apmh  09/10/21  ? ?Discharge date: 09/11/2021 ?  ?Admitted From:Jail ?  ?Disposition:  Jail ?  ?Recommendations for Outpatient Follow-up:  ?Follow up with PCP in 1-2 weeks ?Follow-up with pulmonology in the next 1-2 weeks ?Remain on prednisone as prescribed for  the next 5 days ?Continue breathing treatments as needed for shortness of breath or wheezing ?Continue on Advair as represcribed twice daily ?     ? ?Brief/Interim Summary: ?Per HPI: ?Erica [name] is a 46 y.o. female with medical history significant of asthma who complains of shortness of breath and wheezing over the last 3 days.  She has been getting frequent nebulizers as well as prednisone from [facility] where she has been over the last several days.  She had 2 nebulizer treatments this morning without improvement as well as 60 mg of prednisone without much improvement and therefore she was brought to the ED for further evaluation.  Patient has had recent history of tobacco abuse, but has not been smoking since she has been in jail over the last 20 days.  She denies any fevers, chills, chest pain, or productive cough.  No sick contacts noted. ?  ?In the ED, she has been given nebulizer treatments and remains on room air, but continues to have wheezing.  Chest x-ray with no acute findings and COVID and flu testing negative.  She is noted to have mildly elevated WBC count. ?  ?-Patient was admitted for acute asthma exacerbation which has now improved significantly after  use of IV steroids, inhaled steroids, and breathing treatments.  She is in stable condition for discharge today and no further significant symptoms.  She was briefly requiring nasal cannula oxygen and is no longer requiring this at the moment.  She will be resumed on Advair as previously prescribed by her pulmonologist to assist with symptom control and hopefully decrease the frequency of her exacerbations.  She has been encouraged to follow-up in the near future.  She has also been counseled on smoking cessation. ?  ?Discharge Diagnoses:  ?Principal Problem: ?  Acute asthma exacerbation ?Active Problems: ?  Cigarette smoker ? ? ?09/18/2021  f/u ov/[place] office/Elim Economou re: asthma vs VCD maint on ? (Very shaky on med details )   ?Chief Complaint  ?Patient presents with  ? Follow-up  ?  Patient was unable to follow regimen from last ov.  ?Feels breathing has worsened ?ED visit on Friday 09/12/2021  ?Dyspnea:  at rest ?Cough: harsh hacking non productve  ? SABA use: no better with albuterol ?02: none  ?  ? ? ?No obvious patterns in day to day or daytime variability or assoc excess/ purulent sputum or mucus plugs or hemoptysis or cp or chest tightness, subjective wheeze or overt sinus or hb symptoms.  ?  ? Also denies any obvious fluctuation of symptoms with weather or environmental changes or other aggravating or alleviating factors except as outlined above  ? ?No unusual exposure hx or h/o childhood pna/ asthma or knowledge of premature birth. ? ?Current Allergies, Complete Past Medical History, Past Surgical History, Family History, and Social History were reviewed in [employer] record. ? ?ROS  The following are not active complaints unless bolded ?Hoarseness, sore throat/globus, dysphagia, dental problems, itching, sneezing,  nasal congestion or discharge of excess mucus or purulent secretions, ear ache,   fever, chills, sweats, unintended wt loss or wt gain, classically pleuritic or exertional cp,  orthopnea pnd or arm/hand swelling  or leg swelling, presyncope, palpitations, abdominal pain, anorexia, nausea, vomiting, diarrhea  or change in bowel habits or change in bladder habits, change in stools or change in urine, dysuria, hematuria,  rash, arthralgias, visual complaints, headache, numbness, weakness or ataxia or problems with walking or coordination,  change in mood or  memory. ?      ? ?Current Meds - - NOTE:   Unable to verify as accurately reflecting what pt takes    ?Medication Sig  ? albuterol (PROVENTIL) (2.5 MG/3ML) 0.083% nebulizer solution Take 3 mLs (2.5 mg total) by nebulization every 4 (four) hours as needed.  ? diltiazem (CARDIZEM CD) 180 MG 24 hr capsule Take 180 mg by mouth daily.  ? famotidine  (PEPCID) 20 MG tablet One after supper (Patient taking differently: Take 20 mg by mouth at bedtime.)  ? fluticasone (FLONASE) 50 MCG/ACT nasal spray Place 1 spray into both nostrils daily.  ? Ipratropium-Albuterol (COMBIVENT RESPIMAT) 20-100 MCG/ACT AERS respimat Inhale 1 puff into the lungs every 4 (four) hours as needed for wheezing.  ? ipratropium-albuterol (DUONEB) 0.5-2.5 (3) MG/3ML SOLN Take 3 mLs by nebulization every 4 (four) hours as needed.  ? montelukast (SINGULAIR) 10 MG tablet Take 1 tablet (10 mg total) by mouth at bedtime.  ? pantoprazole (PROTONIX) 40 MG tablet Take 1 tablet (40 mg total) by mouth daily. Take 30-60 min before first meal of the day  ? traMADol (ULTRAM) 50 MG tablet Take 50 mg by mouth every 6 (six) hours as needed.  ?     ?  ? ? ? ?  Objective:  ?  ? ?Wt Readings from Last 3 Encounters:  ?09/18/21 205 lb (93 kg)  ?09/10/21 203 lb 7.8 oz (92.3 kg)  ?08/19/21 207 lb (93.9 kg)  ?  ? ? ?Vital signs reviewed  09/18/2021  - Note at rest 02 sats  97% on RA  ? ?General appearance:    amb hoarse bm classic pseudowheeze ? ? HEENT : pt wearing mask not removed for exam due to covid -19 concerns.  ? ? ?NECK :  without JVD/Nodes/TM/ nl carotid upstrokes bilaterally ? ? ?LUNGS: no acc muscle use,  Nl contour chest with insp/exp wheeze  bilaterally and lots of transmitted noise from upper airway without cough on insp or exp maneuvers ? ? ?CV:  RRR  no s3 or murmur or increase in P2, and no edema  ? ?ABD:  soft and nontender with nl inspiratory excursion in the supine position. No bruits or organomegaly appreciated, bowel sounds nl ? ?MS:  Nl gait/ ext warm without deformities, calf tenderness, cyanosis or clubbing ?No obvious joint restrictions  ? ?SKIN: warm and dry without lesions   ? ?NEURO:  alert, approp, nl sensorium with  no motor or cerebellar deficits apparent.    ?  ? ? ?I personally reviewed images and agree with radiology impression as follows:  ?CXR:   portable 09/10/21  ?No evidence of  active disease. ?My review minimal hyperinflation  ? ? ? ?   ?Assessment  ? ?  ?   ?

## 2021-09-19 ENCOUNTER — Encounter: Payer: Self-pay | Admitting: Internal Medicine

## 2021-09-19 NOTE — Assessment & Plan Note (Signed)
Active smoker  ?- 08/19/2021  After extensive coaching inhaler device,  effectiveness =   50% so change to duoneb qid x one week trial > did not complete  ?- 09/18/2021  After extensive coaching inhaler device,  effectiveness =    90% with SMI > change to stiolto/pred taper/cough suppression ? ?DDX of  difficult airways management almost all start with A and  include Adherence, Ace Inhibitors, Acid Reflux, Active Sinus Disease, Alpha 1 Antitripsin deficiency, Anxiety masquerading as Airways dz,  ABPA,  Allergy(esp in young), Aspiration (esp in elderly), Adverse effects of meds,  Active smoking or vaping, A bunch of PE's (a small clot burden can't cause this syndrome unless there is already severe underlying pulm or vascular dz with poor reserve) plus two Bs  = Bronchiectasis and Beta blocker use..and one C= CHF ? ?Adherence is always the initial "prime suspect" and is a multilayered concern that requires a "trust but verify" approach in every patient - starting with knowing how to use medications, especially inhalers, correctly, keeping up with refills and understanding the fundamental difference between maintenance and prns vs those medications only taken for a very short course and then stopped and not refilled.  ?- see device teaching ?- return with all meds in hand using a trust but verify approach to confirm accurate Medication  Reconciliation The principal here is that until we are certain that the  patients are doing what we've asked, it makes no sense to ask them to do more. ? ?Active smoking top of the list of suspects ? ?Allergy > Prednisone 10 mg take  4 each am x 2 days,   2 each am x 2 days,  1 each am x 2 days and stop and continue singulair ? ?? Acid (or non-acid) GERD > always difficult to exclude as up to 75% of pts in some series report no assoc GI/ Heartburn symptoms> rec max (24h)  acid suppression and diet restrictions/ reviewed and instructions given in writing.  ?-  Of the three most common  causes of  Sub-acute / recurrent or chronic cough, only one (GERD)  can actually contribute to/ trigger  the other two (asthma and post nasal drip syndrome)  and perpetuate the cylce of cough. While not intuitively obvious, many patients with chronic low grade reflux do not cough until there is a primary insult that disturbs the protective epithelial barrier and exposes sensitive nerve endings.   This is typically viral but can due to PNDS and  either may apply here.   The point is that once this occurs, it is difficult to eliminate the cycle  using anything but a maximally effective acid suppression regimen at least in the short run, accompanied by an appropriate diet to address non acid GERD and control / eliminate the cough itself for at least 3 days with tramadol  ? ?? Adverse drug effects > eliminate DPI's  ? ?? Anxiety/depression  > usually at the bottom of this list of usual suspects but  may interfere with adherence and also interpretation of response or lack thereof to symptom management which can be quite subjective.  ? ? ?

## 2021-09-19 NOTE — Assessment & Plan Note (Signed)
Counseled re importance of smoking cessation but did not meet time criteria for separate billing   ? ? ?    ?  ? ?Each maintenance medication was reviewed in detail including emphasizing most importantly the difference between maintenance and prns and under what circumstances the prns are to be triggered using an action plan format where appropriate. ? ?Total time for H and P, chart review, counseling, reviewing smi/hfa/neb device(s) and generating customized AVS unique to this office visit / same day charting  > 30 min  ?     ?

## 2021-10-15 ENCOUNTER — Telehealth: Payer: Self-pay | Admitting: Internal Medicine

## 2021-10-15 NOTE — Telephone Encounter (Signed)
ATC patient.  ?Left detailed message (ok per dpr) letting patient know I would send Dr. Melvyn Novas a message to see if it is ok to fit her in to schedule this Friday or next week. Let her know that Dr. Melvyn Novas is out of office until tomorrow so may not be in touch with her regarding appt until tomorrow. Advised her to call back if she needs [place] before then.  ? ?Dr. Melvyn Novas please advise, this patient only wants to see you about her breathing and your schedule is full Friday 4/14 and next week. Ok to double book?  ? ?Thanks!  ?

## 2021-10-16 NOTE — Telephone Encounter (Signed)
Called and spoke to patient. Added her on to 3:45 in [facility] office. Nothing further needed  ?

## 2021-10-16 NOTE — Telephone Encounter (Signed)
Ok to add on to Friday pm schedule this  week ?

## 2021-10-16 NOTE — Telephone Encounter (Signed)
Attempted to call patient this morning to get her rescheduled for Dr Melvyn Novas, line was busy. Will try again.  ?

## 2021-10-17 ENCOUNTER — Ambulatory Visit: Admitting: Internal Medicine

## 2021-10-17 ENCOUNTER — Other Ambulatory Visit: Payer: Self-pay

## 2021-10-17 ENCOUNTER — Encounter: Payer: Self-pay | Admitting: Internal Medicine

## 2021-10-17 ENCOUNTER — Ambulatory Visit (INDEPENDENT_AMBULATORY_CARE_PROVIDER_SITE_OTHER): Payer: Medicaid Other | Admitting: Internal Medicine

## 2021-10-17 DIAGNOSIS — J4551 Severe persistent asthma with (acute) exacerbation: Secondary | ICD-10-CM | POA: Diagnosis not present

## 2021-10-17 MED ORDER — STIOLTO RESPIMAT 2.5-2.5 MCG/ACT IN AERS: 2.0000 | INHALATION_SPRAY | Freq: Every day | RESPIRATORY_TRACT | 0 refills | Status: DC

## 2021-10-17 MED ORDER — COMBIVENT RESPIMAT 20-100 MCG/ACT IN AERS: 1.0000 | INHALATION_SPRAY | RESPIRATORY_TRACT | 6 refills | Status: DC | PRN

## 2021-10-17 MED ORDER — ACETAMINOPHEN-CODEINE #3 300-30 MG PO TABS
1.0000 | ORAL_TABLET | ORAL | 0 refills | Status: AC | PRN
Start: 2021-10-17 — End: 2021-10-24

## 2021-10-17 MED ORDER — PREDNISONE 10 MG PO TABS: ORAL_TABLET | ORAL | 0 refills | Status: DC

## 2021-10-17 NOTE — Progress Notes (Signed)
? ?Erica [name], female    DOB: 05-18-1976,    MRN: [id number] ? ? 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 Dec 2022  no better with prednisone some better with combivent  but thoroughly confused with details of care so referred for acute evaluation.  ? ? ? ? ?History of Present Illness  ?08/19/2021  Pulmonary/ Acute office eval/ Erica [name] / [place] Office not on maint  (was on advair stopped a week prior to OV  "not working' ) ?Chief Complaint  ?Patient presents with  ? Acute Visit  ?  Acute patient using rescue inhaler every 3-4 hours. Wheezing, coughing, SOB  ? ?Wants to talk about nebulizer medications  ? ?Started after proair was Dc'd.  ?Dyspnea:  50 ft x sev months  ?Cough: slt greenish, worse p supper assoc nasal congestion  ?Sleep: waking up with cough / bed is flat 2 pillows  ?SABA use: way too much  ?Rec ?Albuterol 2.5 mg with Ipatropium four times daily per neb ?Only use your combivent as a rescue medication  ?Prednisone 10 mg take  4 each am x 2 days,   2 each am x 2 days,  1 each am x 2 days and stop  ?Augmentin 875 mg take one pill twice daily  X 10 days  ?Take mucinex dm 1200 mg  every 12 hours and supplement if needed with  tramadol 50 mg up to 1-2 every 4 hours  ?Please schedule a follow up office visit in 1 week  sooner if needed  with all medications /inhalers/ solutions in hand so we can verify exactly what you are taking. This includes all medications from all doctors and over the counters  ?If worse in meantime go to ER ? ?08/22/21 went to jail >  above continued x neb twice daily and no tramadol > cough much worse > apmh  09/10/21  ? ?Discharge date: 09/11/2021 ?  ?Admitted From:Jail ?  ?Disposition:  Jail ?  ?Recommendations for Outpatient Follow-up:  ?Follow up with PCP in 1-2 weeks ?Follow-up with pulmonology in the next 1-2 weeks ?Remain on prednisone as prescribed  for the next 5 days ?Continue breathing treatments as needed for shortness of breath or wheezing ?Continue on Advair as represcribed twice daily ?     ? ?Brief/Interim Summary: ?Per HPI: ?Erica [name] is a 46 y.o. female with medical history significant of asthma who complains of shortness of breath and wheezing over the last 3 days.  She has been getting frequent nebulizers as well as prednisone from [facility] where she has been over the last several days.  She had 2 nebulizer treatments this morning without improvement as well as 60 mg of prednisone without much improvement and therefore she was brought to the ED for further evaluation.  Patient has had recent history of tobacco abuse, but has not been smoking since she has been in jail over the last 20 days.  She denies any fevers, chills, chest pain, or productive cough.  No sick contacts noted. ?  ?In the ED, she has been given nebulizer treatments and remains on room air, but continues to have wheezing.  Chest x-ray with no acute findings and COVID and flu testing negative.  She is noted to have mildly elevated WBC count. ?  ?-Patient was admitted for acute asthma exacerbation which has now improved significantly after  use of IV steroids, inhaled steroids, and breathing treatments.  She is in stable condition for discharge today and no further significant symptoms.  She was briefly requiring nasal cannula oxygen and is no longer requiring this at the moment.  She will be resumed on Advair as previously prescribed by her pulmonologist to assist with symptom control and hopefully decrease the frequency of her exacerbations.  She has been encouraged to follow-up in the near future.  She has also been counseled on smoking cessation. ?  ?Discharge Diagnoses:  ?Principal Problem: ?  Acute asthma exacerbation ?Active Problems: ?  Cigarette smoker ? ? ?09/18/2021  f/u ov/[place] office/Erica [name] re: asthma vs VCD maint on ? (Very shaky on med details )   ?Chief Complaint  ?Patient presents with  ? Follow-up  ?  Patient was unable to follow regimen from last ov.  ?Feels breathing has worsened ?ED visit on Friday 09/12/2021  ?Dyspnea:  at rest ?Cough: harsh hacking non productve  ?SABA use: no better with albuterol ?02: none  ?Rec ?Pantoprazole (protonix) 40 mg   Take  30-60 min before first meal of the day and Pepcid (famotidine)  20 mg after supper until return to office   ?GERD diet reviewed, bed blocks rec  ?Plan A = Automatic = Always=  Stiolto 2 puffs each am  ?Plan B = Backup (to supplement plan A, not to replace it) ?Only use your combivent  inhaler as a rescue medication ?Plan C = Crisis (instead of Plan B but only if Plan B stops working) ?- only use your albuterol nebulizer if you first try Plan B and it fails to help ?For cough mucinex dm 1200 mg every 12 hours and supplement with tramadol 50 mg  1 every 4 hours ?Prednisone 10 mg take  4 each am x 2 days,   2 each am x 2 days,  1 each am x 2 days and stop  ?The key is to stop smoking completely before smoking completely stops you! ?  ? ? ?10/17/2021  extended f/u ov/[place] office/Erica [name] re: asthma/vcd  maint on no stiolto x 3 days  but worse since 10/09/21 ? Why / now off cigs  ?Chief Complaint  ?Patient presents with  ? Follow-up  ?  Feels like regimen from last OV was working well until 10/09/21. Wheezing still. Stopped smoking on 09/12/2021.   ?Dyspnea:  more sob at rest using voice  ?Cough: still coughing even on tramadol 1 q 4 / no nasal symptoms ?Sleeping: on side bed is flat  ?SABA use: increased off stiolto  ?02: none  ?Covid status: vax x 2  ?  ? ? ?No obvious day to day or daytime variability or assoc excess/ purulent sputum or mucus plugs or hemoptysis or cp or chest tightness,  overt sinus or hb symptoms.  ? ? Also denies any obvious fluctuation of symptoms with weather or environmental changes or other aggravating or alleviating factors except as outlined above  ? ?No unusual exposure hx or h/o  childhood pna/ asthma or knowledge of premature birth. ? ?Current Allergies, Complete Past Medical History, Past Surgical History, Family History, and Social History were reviewed in [employer] record. ? ?ROS  The following are not active complaints unless bolded ?Hoarseness, sore throat/globus, dysphagia, dental problems, itching, sneezing,  nasal congestion or discharge of excess mucus or purulent secretions, ear ache,   fever, chills, sweats, unintended wt loss or wt gain, classically pleuritic or exertional cp,  orthopnea pnd or  arm/hand swelling  or leg swelling, presyncope, palpitations, abdominal pain, anorexia, nausea, vomiting, diarrhea  or change in bowel habits or change in bladder habits, change in stools or change in urine, dysuria, hematuria,  rash, arthralgias, visual complaints, headache, numbness, weakness or ataxia or problems with walking or coordination,  change in mood or  memory. ?      ? ?Current Meds - - NOTE:   Unable to verify as accurately reflecting what pt takes    ?Medication Sig  ? albuterol (PROVENTIL) (2.5 MG/3ML) 0.083% nebulizer solution Take 3 mLs (2.5 mg total) by nebulization every 4 (four) hours as needed.  ? Dextromethorphan-guaiFENesin (MUCINEX DM MAXIMUM STRENGTH) 60-1200 MG TB12 Take by mouth.  ? diltiazem (CARDIZEM CD) 180 MG 24 hr capsule Take 180 mg by mouth daily.  ? famotidine (PEPCID) 20 MG tablet One after supper (Patient taking differently: Take 20 mg by mouth at bedtime.)  ? fluticasone (FLONASE) 50 MCG/ACT nasal spray Place 1 spray into both nostrils daily.  ? Ipratropium-Albuterol (COMBIVENT RESPIMAT) 20-100 MCG/ACT AERS respimat Inhale 1 puff into the lungs every 4 (four) hours as needed for wheezing.  ? montelukast (SINGULAIR) 10 MG tablet Take 1 tablet (10 mg total) by mouth at bedtime.  ? pantoprazole (PROTONIX) 40 MG tablet Take 1 tablet (40 mg total) by mouth daily. Take 30-60 min before first meal of the day  ? traMADol (ULTRAM) 50  MG tablet Take 50 mg by mouth every 6 (six) hours as needed.  ?     ?  ?  ? ? ? ?Objective:  ?  ? ? 10/17/2021      205   ?09/18/21 205 lb (93 kg)  ?09/10/21 203 lb 7.8 oz (92.3 kg)  ?08/19/21 207 lb (93.9 k

## 2021-10-17 NOTE — Assessment & Plan Note (Addendum)
Quit smoking 32023  ?- PFTs 04/2021 nl x for very low ERV  ?- 08/19/2021  After extensive coaching inhaler device,  effectiveness =   50% so change to duoneb qid x one week trial > did not complete  ?- 09/18/2021  changed to stiolto/pred taper/cough suppression> improved transiently but still coughing ?-10/17/2021  After extensive coaching inhaler device,  effectiveness = 90%  With [facility] > give 6 weeks stiolto and pred x 6 d and ent eval  ?- Allergy screen 10/17/2021 >  Eos 0. /  IgE   ? ?Of the three most common causes of  Sub-acute / recurrent or chronic cough, only one (GERD)  can actually contribute to/ trigger  the other two (asthma and post nasal drip syndrome)  and perpetuate the cylce of cough. ? ?While not intuitively obvious, many patients with chronic low grade reflux do not cough until there is a primary insult that disturbs the protective epithelial barrier and exposes sensitive nerve endings.   This is typically viral but can due to PNDS and  either may apply here.   The point is that once this occurs, it is difficult to eliminate the cycle  using anything but a maximally effective acid suppression regimen at least in the short run, accompanied by an appropriate diet to address non acid GERD and control / eliminate the cough itself for at least 3 days with tylenol #3 >>> also so added 6 day taper off  Prednisone starting at 40 mg per day in case of component of Th-2 driven upper or lower airways inflammation (if cough responds short term only to relapse before return while will on full rx for uacs (as above), then  that would point to allergic rhinitis/ asthma or eos bronchitis as alternative dx)  ? ? ?>>> allergy screen, ent eval ordered as well and f/u in 6 weeks with all meds in hand using a trust but verify approach to confirm accurate Medication  Reconciliation The principal here is that until we are certain that the  patients are doing what we've asked, it makes no sense to ask them to do more.  ? ? ?     ?  ? ?Each maintenance medication was reviewed in detail including emphasizing most importantly the difference between maintenance and prns and under what circumstances the prns are to be triggered using an action plan format where appropriate. ? ?Total time for H and P, chart review, counseling,  and generating customized AVS unique to this office visit / same day charting  >  30 min      ?

## 2021-10-17 NOTE — Patient Instructions (Addendum)
Pantoprazole (protonix) 40 mg   Take  30-60 min before first meal of the day and Pepcid (famotidine)  20 mg after supper until return to office - this is the best way to tell whether stomach acid is contributing to your problem.   ? ?GERD (REFLUX)  is an extremely common cause of respiratory symptoms just like yours , many times with no obvious heartburn at all.  ? ? It can be treated with medication, but also with lifestyle changes including elevation of the head of your bed (ideally with 6 -8inch blocks under the headboard of your bed),  Smoking cessation, avoidance of late meals, excessive alcohol, and avoid fatty foods, chocolate, peppermint, colas, red wine, and acidic juices such as orange juice.  ?NO MINT OR MENTHOL PRODUCTS SO NO COUGH DROPS  ?USE SUGARLESS CANDY INSTEAD (Jolley ranchers or Stover's or Life Savers) or even ice chips will also do - the key is to swallow to prevent all throat clearing. ?NO OIL BASED VITAMINS - use powdered substitutes.  Avoid fish oil when coughing.  ? ?Plan A = Automatic = Always=    Stiolto 2 puffs each am  ? ?Plan B=   ?- only use your albuterol nebulizer if you first try Plan B and it fails to help > ok to use the nebulizer up to every 4 hours but if start needing it regularly call for immediate appointment ? ?For cough > mucinex dm 1200 mg every 12 hours and supplement with tylenol # 3  1-2  every 4 hours as needed  ? ?Prednisone 10 mg take  4 each am x 2 days,   2 each am x 2 days,  1 each am x 2 days and stop  ? ?Please remember to go to the lab department   for your tests - we will call you with the results when they are available. ?    ?We will be referring you to ENT in [facility] for a throat evaluation  ?  ?   ?Please schedule a follow up office visit in 6 weeks, call sooner if needed with all medications /inhalers/ solutions in hand so we can verify exactly what you are taking. This includes all medications from all doctors and over the counters  ? ? ? ? ? ? ? ? ?    I will  ?

## 2021-10-18 ENCOUNTER — Encounter: Payer: Self-pay | Admitting: Internal Medicine

## 2021-10-21 LAB — CBC WITH DIFFERENTIAL/PLATELET
Basophils Absolute: 0 10*3/uL (ref 0.0–0.2)
Basos: 0 %
EOS (ABSOLUTE): 0.5 10*3/uL — ABNORMAL HIGH (ref 0.0–0.4)
Eos: 5 %
Hematocrit: 38 % (ref 34.0–46.6)
Hemoglobin: 12.6 g/dL (ref 11.1–15.9)
Immature Grans (Abs): 0 10*3/uL (ref 0.0–0.1)
Immature Granulocytes: 0 %
Lymphocytes Absolute: 3.6 10*3/uL — ABNORMAL HIGH (ref 0.7–3.1)
Lymphs: 38 %
MCH: 29.2 pg (ref 26.6–33.0)
MCHC: 33.2 g/dL (ref 31.5–35.7)
MCV: 88 fL (ref 79–97)
Monocytes Absolute: 0.5 10*3/uL (ref 0.1–0.9)
Monocytes: 5 %
Neutrophils Absolute: 4.8 10*3/uL (ref 1.4–7.0)
Neutrophils: 52 %
Platelets: 361 10*3/uL (ref 150–450)
RBC: 4.32 x10E6/uL (ref 3.77–5.28)
RDW: 14.4 % (ref 11.7–15.4)
WBC: 9.4 10*3/uL (ref 3.4–10.8)

## 2021-10-21 LAB — IGE: IgE (Immunoglobulin E), Serum: 7995 IU/mL — ABNORMAL HIGH (ref 6–495)

## 2021-10-22 ENCOUNTER — Other Ambulatory Visit: Payer: Self-pay

## 2021-10-22 DIAGNOSIS — R768 Other specified abnormal immunological findings in serum: Secondary | ICD-10-CM

## 2021-10-31 ENCOUNTER — Ambulatory Visit: Admitting: Pulmonary Disease

## 2021-11-12 ENCOUNTER — Ambulatory Visit: Admitting: Primary Care

## 2021-11-21 ENCOUNTER — Telehealth: Payer: Self-pay | Admitting: Pulmonary Disease

## 2021-11-21 ENCOUNTER — Encounter: Payer: Self-pay | Admitting: Internal Medicine

## 2021-11-21 ENCOUNTER — Ambulatory Visit (INDEPENDENT_AMBULATORY_CARE_PROVIDER_SITE_OTHER): Payer: Medicaid Other | Admitting: Internal Medicine

## 2021-11-21 VITALS — BP 144/80 | HR 98 | Temp 97.7°F | Ht 60.0 in | Wt 203.0 lb

## 2021-11-21 DIAGNOSIS — J4551 Severe persistent asthma with (acute) exacerbation: Secondary | ICD-10-CM | POA: Diagnosis not present

## 2021-11-21 MED ORDER — METHYLPREDNISOLONE ACETATE 80 MG/ML IJ SUSP
120.0000 mg | Freq: Once | INTRAMUSCULAR | Status: AC
  Administered 2021-11-21: 120 mg via INTRAMUSCULAR

## 2021-11-21 NOTE — Addendum Note (Signed)
Addended by: Christinia Gully B on: 11/21/2021 02:01 PM   Modules accepted: Orders

## 2021-11-21 NOTE — Telephone Encounter (Signed)
I am okay double booking visit.

## 2021-11-21 NOTE — Telephone Encounter (Signed)
Patient scheduled.  Nothing further needed.

## 2021-11-21 NOTE — Telephone Encounter (Signed)
Patient saw Dr. Melvyn Novas today but was not satisfied with assessment and would like to see Dr. Halford Chessman only. Dr. Halford Chessman your next opening is in July but patient feels she needs to be seen sooner than that. Refused offer to see NP in [place]. Dr. Halford Chessman please advise if we can add on or DB patient for week of 12/08/21 when youre here in [place]?

## 2021-11-21 NOTE — Assessment & Plan Note (Addendum)
Quit smoking 09/2021  - PFTs 04/2021 nl x for very low ERV off all meds and wt 208 and nl f/v loop then   - 08/19/2021  After extensive coaching inhaler device,  effectiveness =   50% so change to duoneb qid x one week trial > did not complete  - 09/18/2021  changed to stiolto/pred taper/cough suppression> improved transiently but still coughing -10/17/2021  After extensive coaching inhaler device,  effectiveness = 90%  With [facility] > give 6 weeks stiolto and pred x 6 d and ent eval  - Allergy screen 10/17/2021 >  Eos 0.5 /  IgE  7995 > referred to allergy 10/21/2021   DDX of  difficult airways management almost all start with A and  include Adherence, Ace Inhibitors, Acid Reflux, Active Sinus Disease, Alpha 1 Antitripsin deficiency, Anxiety masquerading as Airways dz,  ABPA,  Allergy(esp in young), Aspiration (esp in elderly), Adverse effects of meds,  Active smoking or vaping, A bunch of PE's (a small clot burden can't cause this syndrome unless there is already severe underlying pulm or vascular dz with poor reserve) plus two Bs  = Bronchiectasis and Beta blocker use..and one C= CHF  Adherence is always the initial "prime suspect" and is a multilayered concern that requires a "trust but verify" approach in every patient - starting with knowing how to use medications, especially inhalers, correctly, keeping up with refills and understanding the fundamental difference between maintenance and prns vs those medications only taken for a very short course and then stopped and not refilled.  - multiple bottles look very old, requesting pharmacy records for refills  - 11/21/2021  After extensive coaching inhaler device,  effectiveness = 80% with smi so just continue combivent one click q 4h prn as doesn't think stiolto helped at all.     ? Allergy/ABPA    Referred to allergy / continue singulair/ depomedrol 120 mg IM   ? Acid (or non-acid) GERD > always difficult to exclude as up to 75% of pts in some series report  no assoc GI/ Heartburn symptoms> rec max (24h)  acid suppression and diet restrictions/ reviewed and instructions given in writing.   ? Anxiety > usually at the bottom of this list of usual suspects but should be much higher and may interfere with adherence and also interpretation of response or lack thereof to symptom management which can be quite subjective.   Of the three most common causes of  Sub-acute / recurrent or chronic cough, only one (GERD)  can actually contribute to/ trigger  the other two (asthma and post nasal drip syndrome)  and perpetuate the cylce of cough.  While not intuitively obvious, many patients with chronic low grade reflux do not cough until there is a primary insult that disturbs the protective epithelial barrier and exposes sensitive nerve endings.   This is typically viral but can due to PNDS and  either may apply here.    >>> The point is that once this occurs, it is difficult to eliminate the cycle  using anything but a maximally effective acid suppression regimen at least in the short run, accompanied by an appropriate diet to address non acid GERD and control / eliminate the cough itself for at least 3 days with use of delsym/hard rock candy (avoid codiene as says was ineffective)   And eliminate pnds with zyrtec D/ keep allergy f/u as planned ? Needs trial of gabapentin next if symptoms remain refractory  Each maintenance medication was reviewed in detail including emphasizing most importantly the difference between maintenance and prns and under what circumstances the prns are to be triggered using an action plan format where appropriate.  Total time for H and P, chart review, counseling, reviewing [facility] device(s) and generating customized AVS unique to this office visit / same day charting = > 40 min for refractory acute on chronic cough/wheeze/sob

## 2021-11-21 NOTE — Patient Instructions (Signed)
Depomedrol 120 mg IM   Zyrtec D (otc)  one daily as needed for nasal congestion   Keep appt for allergy and ENT   .I emphasized that nasal steroids (flonase) have no immediate benefit in terms of improving symptoms.  To help them reached the target tissue, the patient should use Afrin (otc)  two puffs every 12 hours applied one min before using the nasal steroids.  Afrin should be stopped after no more than 5 days.  If the symptoms worsen, Afrin can be restarted after 5 days off of therapy to prevent rebound congestion from overuse of Afrin.  I also emphasized that in no way are nasal steroids a concern in terms of "addiction".   GERD (REFLUX)  is an extremely common cause of respiratory symptoms just like yours , many times with no obvious heartburn at all.    It can be treated with medication, but also with lifestyle changes including elevation of the head of your bed (ideally with 6 -8inch blocks under the headboard of your bed),  Smoking cessation, avoidance of late meals, excessive alcohol, and avoid fatty foods, chocolate, peppermint, colas, red wine, and acidic juices such as orange juice.  NO MINT OR MENTHOL PRODUCTS SO NO COUGH DROPS  USE SUGARLESS CANDY INSTEAD (Jolley ranchers or Stover's or Life Savers) or even ice chips will also do - the key is to swallow to prevent all throat clearing. NO OIL BASED VITAMINS - use powdered substitutes.  Avoid fish oil when coughing.    Work on inhaler technique:  relax and gently blow all the way out then take a nice smooth full deep breath back in, triggering the inhaler at same time you start breathing in.  Hold for up to 5 seconds if you can.   Rinse and gargle with water when done.  If mouth or throat bother you at all,  try brushing teeth/gums/tongue with arm and hammer toothpaste/ make a slurry and gargle and spit out.   Go to ER if condition worsens   Follow up with Dr  Halford Chessman  6 weeks

## 2021-11-21 NOTE — Progress Notes (Signed)
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 Dec 2022  no better with prednisone some better with combivent  but thoroughly confused with details of care so self referred for acute evaluation.    02/05/2021 first documentation of wheezing by Dr Halford Chessman, exam had been clear since then   As of  04/22/21 was "sob with any exertion"  with pfts off all inhalers wnl x for ERV 15% at wt 208         History of Present Illness  08/19/2021  Pulmonary/ Acute office eval/ Melvyn Novas / [place] not on maint  (was on advair stopped a week prior to OV  "not working' ) Risk analyst Complaint  Patient presents with   Acute Visit    Acute patient using rescue inhaler every 3-4 hours. Wheezing, coughing, SOB   Wants to talk about nebulizer medications   Started after proair was Dc'd.  Dyspnea:  50 ft x sev months  Cough: slt greenish, worse p supper assoc nasal congestion  Sleep: waking up with cough / bed is flat 2 pillows  SABA use: way too much  Rec Albuterol 2.5 mg with Ipatropium four times daily per neb Only use your combivent as a rescue medication  Prednisone 10 mg take  4 each am x 2 days,   2 each am x 2 days,  1 each am x 2 days and stop  Augmentin 875 mg take one pill twice daily  X 10 days  Take mucinex dm 1200 mg  every 12 hours and supplement if needed with  tramadol 50 mg up to 1-2 every 4 hours  Please schedule a follow up office visit in 1 week  sooner if needed  with all medications /inhalers/ solutions in hand so we can verify exactly what you are taking. This includes all medications from all doctors and over the counters  If worse in meantime go to ER  08/22/21 went to jail >  above continued x neb twice daily and no tramadol > cough much worse > [facility]  09/10/21   Discharge date: 09/11/2021    Admitted From:Jail   Disposition:  Jail   Recommendations for Outpatient Follow-up:  Follow up with PCP in 1-2 weeks Follow-up with pulmonology in the next 1-2 weeks Remain on prednisone as prescribed for the next 5 days Continue breathing treatments as needed for shortness of breath or wheezing Continue on Advair as represcribed twice daily       Brief/Interim Summary: Per HPI: Erica [name] is a 46 y.o. female with medical history significant of asthma who complains of shortness of breath and wheezing over the last 3 days.  She has been getting frequent nebulizers as well as prednisone from [facility] where she has been over the last several days.  She had 2 nebulizer treatments this morning without improvement as well as 60 mg of prednisone without much improvement and therefore she was brought to the ED for further evaluation.  Patient has had recent history of tobacco abuse, but has not been smoking since she has been in jail over the last 20 days.  She denies any fevers, chills, chest pain, or productive cough.  No sick contacts noted.   In the ED, she has been given nebulizer treatments and remains  on room air, but continues to have wheezing.  Chest x-ray with no acute findings and COVID and flu testing negative.  She is noted to have mildly elevated WBC count.   -Patient was admitted for acute asthma exacerbation which has now improved significantly after use of IV steroids, inhaled steroids, and breathing treatments.  She is in stable condition for discharge today and no further significant symptoms.  She was briefly requiring nasal cannula oxygen and is no longer requiring this at the moment.  She will be resumed on Advair as previously prescribed by her pulmonologist to assist with symptom control and hopefully decrease the frequency of her exacerbations.  She has been encouraged to follow-up in the near future.  She has also been counseled on smoking cessation.    Discharge Diagnoses:  Principal Problem:   Acute asthma exacerbation Active Problems:   Cigarette smoker   09/18/2021  f/u ov/[place] office/Mickell Birdwell re: asthma vs VCD maint on ? (Very shaky on med details )  Chief Complaint  Patient presents with   Follow-up    Patient was unable to follow regimen from last ov.  Feels breathing has worsened ED visit on Friday 09/12/2021  Dyspnea:  at rest Cough: harsh hacking non productve  SABA use: no better with albuterol 02: none  Rec Pantoprazole (protonix) 40 mg   Take  30-60 min before first meal of the day and Pepcid (famotidine)  20 mg after supper until return to office   GERD diet reviewed, bed blocks rec  Plan A = Automatic = Always=  Stiolto 2 puffs each am  Plan B = Backup (to supplement plan A, not to replace it) Only use your combivent  inhaler as a rescue medication Plan C = Crisis (instead of Plan B but only if Plan B stops working) - only use your albuterol nebulizer if you first try Plan B and it fails to help For cough mucinex dm 1200 mg every 12 hours and supplement with tramadol 50 mg  1 every 4 hours Prednisone 10 mg take  4 each am x 2 days,   2 each am x 2 days,  1 each am x 2 days and stop  The key is to stop smoking completely before smoking completely stops you!     10/17/2021  extended f/u ov/[place] office/Liberty Seto re: asthma/vcd  maint on no stiolto x 3 days  but worse since 10/09/21 ? Why / now off cigs  Chief Complaint  Patient presents with   Follow-up    Feels like regimen from last OV was working well until 10/09/21. Wheezing still. Stopped smoking on 09/12/2021.   Dyspnea:  more sob at rest using voice  Cough: still coughing even on tramadol 1 q 4 / no nasal symptoms Sleeping: on side bed is flat  SABA use: increased off stiolto  02: none  Covid status: vax x 2  Rec Pantoprazole (protonix) 40 mg   Take  30-60 min before first meal of the day and Pepcid (famotidine)  20 mg after supper  GERD diet reviewed,  bed blocks rec  Plan A = Automatic = Always=    Stiolto 2 puffs each am  Plan B=   ok to use the nebulizer up to every 4 hours but if start needing it regularly call for immediate appointment For cough > mucinex dm 1200 mg every 12 hours and supplement with tylenol # 3  1-2  every 4 hours as needed  Prednisone 10 mg take  4  each am x 2 days,   2 each am x 2 days,  1 each am x 2 days and stop   Please remember to go to the lab department   for your tests - we will call you with the results when they are available.     We will be referring you to ENT in [facility] for a throat evaluation    Please schedule a follow up office visit in 6 weeks, call sooner if needed with all medications /inhalers/ solutions in hand so we can verify exactly what you are taking. This includes all medications from all doctors and over the counters   11/21/2021  f/u ov/[place] office/Natarsha Hurwitz re: asthma/vcd maint on combivent   Chief Complaint  Patient presents with   Follow-up    Wheezing and breathing worse this week. Coughing    Dyspnea:  50 ft  ? How long has it been since last walking s breathing difficulty Cough: sporadic severe coughing fits with pseudowheeze:  can't identify time or pattern of flares but really not consistently any better even on high dose pred and codeine   Sleeping: flat bed / on side 2 pillows - does the best noct  SABA use: combivent every 4 hours  02: none      No obvious patterns day to day or daytime variability  of cough/wheeze/sob or assoc excess/ purulent sputum or mucus plugs or hemoptysis or cp or chest tightness, subjective wheeze or overt sinus or hb symptoms.     Also denies any obvious fluctuation of symptoms with weather or environmental changes or other aggravating or alleviating factors except as outlined above   No unusual exposure hx or h/o childhood pna/ asthma or knowledge of premature birth.  Current Allergies, Complete Past Medical History, Past Surgical History,  Family History, and Social History were reviewed in [employer] record.  ROS  The following are not active complaints unless bolded Hoarseness, sore throat, dysphagia, dental problems, itching, sneezing,  nasal congestion or discharge of excess mucus or purulent secretions, ear ache,   fever, chills, sweats, unintended wt loss or wt gain, classically pleuritic or exertional cp,  orthopnea pnd or arm/hand swelling  or leg swelling, presyncope, palpitations, abdominal pain, anorexia, nausea, vomiting, diarrhea  or change in bowel habits or change in bladder habits, change in stools or change in urine, dysuria, hematuria,  rash, arthralgias, visual complaints, headache, numbness, weakness or ataxia or problems with walking or coordination,  change in mood or  memory.        Current Meds -  - NOTE:   Unable to verify as accurately reflecting what pt takes , pharmacy log requested  Medication Sig   albuterol (PROVENTIL) (2.5 MG/3ML) 0.083% nebulizer solution Take 3 mLs (2.5 mg total) by nebulization every 4 (four) hours as needed.   Dextromethorphan-guaiFENesin (MUCINEX DM MAXIMUM STRENGTH) 60-1200 MG TB12 Take by mouth.   diltiazem (CARDIZEM CD) 180 MG 24 hr capsule Take 180 mg by mouth daily.   famotidine (PEPCID) 20 MG tablet One after supper (Patient taking differently: Take 20 mg by mouth at bedtime.)   fluticasone (FLONASE) 50 MCG/ACT nasal spray Place 1 spray into both nostrils daily.   Ipratropium-Albuterol (COMBIVENT RESPIMAT) 20-100 MCG/ACT AERS respimat Inhale 1 puff into the lungs every 4 (four) hours as needed for wheezing.   montelukast (SINGULAIR) 10 MG tablet Take 1 tablet (10 mg total) by mouth at bedtime.   pantoprazole (PROTONIX) 40 MG tablet Take 1 tablet (40  mg total) by mouth daily. Take 30-60 min before first meal of the day   predniSONE (DELTASONE) 10 MG tablet Take  4 each am x 2 days,   2 each am x 2 days,  1 each am x 2 days and stop   Tiotropium  Bromide-Olodaterol (STIOLTO RESPIMAT) 2.5-2.5 MCG/ACT AERS Inhale 2 puffs into the lungs daily.   traMADol (ULTRAM) 50 MG tablet Take 50 mg by mouth every 6 (six) hours as needed.               Objective:    wts  11/21/2021       203    10/17/2021      205   09/18/21 205 lb (93 kg)  09/10/21 203 lb 7.8 oz (92.3 kg)  08/19/21 207 lb (93.9 kg)    Vital signs reviewed  11/21/2021  - Note at rest 02 sats  94% on RA   General appearance:    amb hoarse bm/ classic pseudowheeze resolves with hard rock candy      Pw/ severe TE/ better with butterscotch candy    HEENT : Oropharynx  clear  Nasal turbintes severe edema/ watery discharge    NECK :  without  appent JVD/ palpable Nodes/TM    LUNGS: no acc muscle use,  Nl contour chest which is clear to A and P bilaterally without cough on insp or exp maneuvers   CV:  RRR  no s3 or murmur or increase in P2, and no edema   ABD:  soft and nontender with nl inspiratory excursion in the supine position. No bruits or organomegaly appreciated   MS:  Nl gait/ ext warm without deformities Or obvious joint restrictions  calf tenderness, cyanosis or clubbing     SKIN: warm and dry without lesions    NEURO:  alert, approp, nl sensorium with  no motor or cerebellar deficits apparent.     I personally reviewed images and agree with radiology impression as follows:  CXR:   portable 09/10/21   No evidence of active dz       Assessment

## 2021-12-08 ENCOUNTER — Ambulatory Visit (INDEPENDENT_AMBULATORY_CARE_PROVIDER_SITE_OTHER): Payer: Medicaid Other | Admitting: Allergy & Immunology

## 2021-12-08 ENCOUNTER — Encounter: Payer: Self-pay | Admitting: Allergy & Immunology

## 2021-12-08 VITALS — BP 128/84 | HR 80 | Temp 97.9°F | Ht <= 58 in | Wt 193.5 lb

## 2021-12-08 DIAGNOSIS — J3089 Other allergic rhinitis: Secondary | ICD-10-CM | POA: Diagnosis not present

## 2021-12-08 DIAGNOSIS — R918 Other nonspecific abnormal finding of lung field: Secondary | ICD-10-CM

## 2021-12-08 DIAGNOSIS — K219 Gastro-esophageal reflux disease without esophagitis: Secondary | ICD-10-CM | POA: Diagnosis not present

## 2021-12-08 DIAGNOSIS — J4540 Moderate persistent asthma, uncomplicated: Secondary | ICD-10-CM

## 2021-12-08 DIAGNOSIS — R0602 Shortness of breath: Secondary | ICD-10-CM

## 2021-12-08 DIAGNOSIS — L2089 Other atopic dermatitis: Secondary | ICD-10-CM | POA: Diagnosis not present

## 2021-12-08 MED ORDER — LEVOCETIRIZINE DIHYDROCHLORIDE 5 MG PO TABS: 5.0000 mg | ORAL_TABLET | Freq: Two times a day (BID) | ORAL | 5 refills | Status: DC | PRN

## 2021-12-08 MED ORDER — ALBUTEROL SULFATE HFA 108 (90 BASE) MCG/ACT IN AERS
2.0000 | INHALATION_SPRAY | RESPIRATORY_TRACT | 1 refills | Status: DC | PRN
Start: 2021-12-08 — End: 2021-12-10

## 2021-12-08 MED ORDER — BUDESONIDE-FORMOTEROL FUMARATE 160-4.5 MCG/ACT IN AERO
2.0000 | INHALATION_SPRAY | Freq: Two times a day (BID) | RESPIRATORY_TRACT | 5 refills | Status: DC
Start: 2021-12-08 — End: 2023-05-04

## 2021-12-08 NOTE — Addendum Note (Signed)
Addended by: Clovis Cao A on: 12/08/2021 04:58 PM   Modules accepted: Orders

## 2021-12-08 NOTE — Progress Notes (Signed)
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 Moderate persistent asthma, uncomplicated - with elevated eosinophils - Lung testing looked fairly good, but it did improve with the albuterol treatment. - I am going to restart you on a medication that contains a long acting albuterol combined with an inhaled steroid called Symbicort. - This is softer than the Advair and hopefully will not cause the sore throat. - We are also going to get some lab work to rule out serious causes of asthma. - You did have elevated eosinophils (white blood cells that do nothing useful aside from making asthma worse). - We may consider adding on Fasenra for long term control of your breathing (attacks eosinophils and can make your breathing better).  - Information on Fasenra provided.  - Spacer sample and demonstration provided. - Daily controller medication(s): Symbicort 160/4.74mg two puffs twice daily with spacer - Prior to physical activity: albuterol 2 puffs 10-15 minutes before physical activity. - Rescue medications: albuterol 4 puffs every 4-6 hours as needed - Asthma control goals:  * Full participation in all desired activities (may need albuterol before activity) * Albuterol use two time or less a week on average (not counting use with activity) * Cough interfering with sleep two time or less a month * Oral steroids no more than once a year * No hospitalizations  2. Perennial allergic rhinitis - Testing today showed: dust mites, cat, and cockroach. - Copy of test results provided.   - Avoidance measures provided. - Stop taking: Singulair since you never noticed improvement - Start taking: Xyzal (levocetirizine) '5mg'$  tablet once daily - You can use an extra dose of the antihistamine, if needed, for breakthrough symptoms.  - Consider nasal saline rinses 1-2 times daily to remove allergens from the nasal cavities as well as help with mucous clearance (this is especially helpful to do before the nasal sprays are given) - Consider allergy shots as a means of long-term control. - Allergy shots "re-train" and "reset" the immune system to ignore environmental allergens and decrease the resulting immune response to those allergens (sneezing, itchy watery eyes, runny nose, nasal congestion, etc).    - Allergy shots improve symptoms in 75-85% of patients.  - We can discuss more at the next appointment if the medications are not working for you.  3. Flexural atopic dermatitis - Continue with triamcinolone 0.1% ointment twice daily as needed. - The addition of the Xyzal should help with itching.  4. GERD - Continue with Protonix daily. - Continue with Pepcid daily.  5. Return in about 4 weeks (around 01/05/2022).     This note in its entirety was forwarded to the Provider who requested this consultation.  Subjective:   Erica Lapriseis a 46y.o. female presenting today for evaluation of  Chief Complaint  Patient presents with   Allergy Testing    Had blood testing done and showed positive to allergies. Seasonal allergies congestion, watery eyes, sneezing, itchy throat.    Asthma    SOB frequently, wheezing,dry cough in the past few weeks.  Also is seeing a pulmonologist.    Eczema  Gets eczema flare ups occ. Has been to the ER for eczema. Uses aquafor for it and benadryl. Occurs on the hands and feet.     Erica [name] has a history of the following: Patient Active Problem List   Diagnosis Date Noted   Acute asthma exacerbation 09/10/2021   Atypical asthma ?  VCD 08/19/2021   Cigarette smoker 08/19/2021   Chronic PID (chronic pelvic inflammatory disease) 07/07/2013   Right tubo-ovarian abscess 04/20/2013   Pelvic abscess in female 02/08/2013    History obtained from: chart review and patient.  Erica [name] was referred by Raiford Simmonds., PA-C.     Erica [name] is a 46 y.o. female presenting for an evaluation of breathing issues and allergies .   Asthma/Respiratory Symptom History: She does have some breathing difficulties.  She has seen Dr. Melvyn Novas most recently on May 19.  At that time, she was given Depo-Medrol 120 mg and continued on Singulair.  It seems that she was first referred to pulmonology due to a flare in December 2022. She was first diagnosed with asthma around two years ago. This is when she first started seeing [facility].  She apparently has flares that start with SOB and she gets to where she cannot breathe. Symptoms are consistent but worse at night. They are not worse during a particular time of the year.   She is currently on Combivent. She tried Stiolto in the past. She was on Advair at some point, but she tells me that Dr. Melvyn Novas took her off of it. She was on ProAir at some point, too. She was changed to Ventolin but she did not think that this was working.   She apparently originally saw Dr. Halford Chessman. She had to see Dr. Melvyn Novas due to scheduling issue, so she saw him a couple of times. She is planing to go back to Dr. Halford Chessman. She is going to see him tomorrow to reestablish care with him.   She thinks she was on Advair for period of time.  This was originally prescribed by Dr. Halford Chessman.  She is unsure why she was taken off of it, but after a while she does remember that she had a sore throat around that time.  She denies the presence of thrush.  However, she definitely remembers that she felt better on the Advair.  She was never changed to a different combined ICS/LAMA.  She never had asthma as a child. Asthma was never a  consideration before two years. She got COVID19 in 2021 which might have been the trigger for all of these. She had a fever with the [place] but she did not require hospitalization. She was home and dealt with it.   She has been to the ED four times for her breathing. Most recently, she went in March 2023 and was admitted overnight.  She had a chest CT in December 2021.  Chest CT (December 2021): IMPRESSION: 1. Nonspecific diffuse bronchial wall thickening, as can be seen with chronic bronchitis or reactive airways disease. 2. Nonspecific patchy ground-glass opacities in peripheral upper lobes. Differential is broad and includes mild postinflammatory fibrosis, atypical/viral infection (including COVID-19), drug reaction and toxic inhalational exposure. Follow-up high-resolution chest CT may be obtained in 6-12 months to assess temporal pattern stability as clinically warranted.  Allergic Rhinitis Symptom History: She never really took the Zyrtec-D. She has tried some off brand one, but she is unsure whether that works or not. She does have a nose spray - Flonase - which  might have helped some.  She has had severe congestion which has never been a problem until she got COVID19. She is on the montelukast but she is not convinced that it has done anything at all.  Skin Symptom History: She has never had eczema before. She thinks that this started with the COVID19 infection.  She had an infection on her fingers in July 2022 that was from eczema. She has had two eczema flares requiring antibiotics. She is on triamcinolone to use as needed. One of her flares was actually treated when she was transferred to [facility].   GERD Symptom History: She is on Protonix '40mg'$  daily.  She is on Pepcid at night as well. She has never had typical GERD symptoms.   Otherwise, there is no history of other atopic diseases, including drug allergies, stinging insect allergies, eczema, urticaria, or contact  dermatitis. There is no significant infectious history. Vaccinations are up to date.    Past Medical History: Patient Active Problem List   Diagnosis Date Noted   Acute asthma exacerbation 09/10/2021   Atypical asthma ? VCD 08/19/2021   Cigarette smoker 08/19/2021   Chronic PID (chronic pelvic inflammatory disease) 07/07/2013   Right tubo-ovarian abscess 04/20/2013   Pelvic abscess in female 02/08/2013    Medication List:  Allergies as of 12/08/2021       Reactions   Strawberry (diagnostic) Swelling        Medication List        Accurate as of December 08, 2021  1:29 PM. If you have any questions, ask your nurse or doctor.          albuterol (2.5 MG/3ML) 0.083% nebulizer solution Commonly known as: PROVENTIL Take 3 mLs (2.5 mg total) by nebulization every 4 (four) hours as needed. What changed: Another medication with the same name was added. Make sure you understand how and when to take each. Changed by: Valentina Shaggy, MD   albuterol 108 (90 Base) MCG/ACT inhaler Commonly known as: Ventolin HFA Inhale 2 puffs into the lungs every 4 (four) hours as needed for wheezing or shortness of breath. What changed: You were already taking a medication with the same name, and this prescription was added. Make sure you understand how and when to take each. Changed by: Valentina Shaggy, MD   budesonide-formoterol 160-4.5 MCG/ACT inhaler Commonly known as: Symbicort Inhale 2 puffs into the lungs 2 (two) times daily. With Spacer Started by: Valentina Shaggy, MD   Combivent Respimat 20-100 MCG/ACT Aers respimat Generic drug: Ipratropium-Albuterol Inhale 1 puff into the lungs every 4 (four) hours as needed for wheezing.   diltiazem 180 MG 24 hr capsule Commonly known as: CARDIZEM CD Take 180 mg by mouth daily.   famotidine 20 MG tablet Commonly known as: Pepcid One after supper What changed:  how much to take how to take this when to take this additional  instructions   fluticasone 50 MCG/ACT nasal spray Commonly known as: FLONASE Place 1 spray into both nostrils daily.   levalbuterol 0.63 MG/3ML nebulizer solution Commonly known as: XOPENEX Take by nebulization.   levocetirizine 5 MG tablet Commonly known as: XYZAL Take 1 tablet (5 mg total) by mouth 2 (two) times daily as needed. Started by: Valentina Shaggy, MD   montelukast 10 MG tablet Commonly known as: SINGULAIR Take 1 tablet (10 mg total) by mouth at bedtime.   pantoprazole 40 MG tablet Commonly known as: Protonix Take 1 tablet (40 mg total)  by mouth daily. Take 30-60 min before first meal of the day        Birth History: non-contributory  Developmental History: non-contributory  Past Surgical History: Past Surgical History:  Procedure Laterality Date   ABDOMINAL HYSTERECTOMY N/A 07/07/2013   Procedure: HYSTERECTOMY ABDOMINAL with bilateral salpingo-oophorectomy;  Surgeon: Woodroe Mode, MD;  Location: [facility];  Service: Gynecology;  Laterality: N/A;   CESAREAN SECTION     DILATION AND EVACUATION N/A 02/03/2013   Procedure: DILATATION AND EVACUATION;  Surgeon: Woodroe Mode, MD;  Location: [facility];  Service: Gynecology;  Laterality: N/A;     Family History: Family History  Problem Relation Age of Onset   Hypertension Mother    Eczema Sister    Allergic rhinitis Daughter    Eczema Daughter    Allergic rhinitis Son    Eczema Son      Social History: Ioana lives at home with her family.  She lives in an apartment of unknown age.  There is hardwood throughout the home.  She has electric heating and central cooling.  There are no animals inside or outside of the home.  There are no dust mite covers on the bedding.  There is vape exposure.  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  She has done this for 3 months.  She is around a lot of dust both in her workplace in her home.  She does not use a HEPA filter.  She does not live near an interstate or  industrial area.   Review of Systems  Constitutional: Negative.  Negative for chills, fever, malaise/fatigue and weight loss.  HENT:  Positive for congestion and sinus pain. Negative for ear discharge and ear pain.        Positive for postnasal drip.   Eyes:  Negative for pain, discharge and redness.  Respiratory:  Positive for cough and shortness of breath. Negative for sputum production and wheezing.   Cardiovascular: Negative.  Negative for chest pain and palpitations.  Gastrointestinal:  Negative for abdominal pain, diarrhea, heartburn, nausea and vomiting.  Skin: Negative.  Negative for itching and rash.  Neurological:  Negative for dizziness and headaches.  Endo/Heme/Allergies:  Negative for environmental allergies. Does not bruise/bleed easily.      Objective:   Blood pressure 128/84, pulse 80, temperature 97.9 F (36.6 C), height 4' 9.75" (1.467 m), weight 193 lb 8 oz (87.8 kg), last menstrual period 06/17/2013, SpO2 97 %. Body mass index is 40.79 kg/m.     Physical Exam Vitals reviewed.  Constitutional:      Appearance: She is well-developed.  HENT:     Head: Normocephalic and atraumatic.     Right Ear: Tympanic membrane, ear canal and external ear normal. No drainage, swelling or tenderness. Tympanic membrane is not injected, scarred, erythematous, retracted or bulging.     Left Ear: Tympanic membrane, ear canal and external ear normal. No drainage, swelling or tenderness. Tympanic membrane is not injected, scarred, erythematous, retracted or bulging.     Nose: Mucosal edema and rhinorrhea present. No nasal deformity or septal deviation.     Right Turbinates: Enlarged, swollen and pale.     Left Turbinates: Enlarged, swollen and pale.     Right Sinus: No maxillary sinus tenderness or frontal sinus tenderness.     Left Sinus: No maxillary sinus tenderness or frontal sinus tenderness.     Comments: No polyps appreciated, but her turbinates are so large that it might be  hard  to tell.    Mouth/Throat:     Lips: Pink.     Mouth: Mucous membranes are moist. Mucous membranes are not pale and not dry.     Pharynx: Uvula midline.     Comments: Cobblestoning present in the posterior oropharynx.  Eyes:     General: Lids are normal. Allergic shiner present.        Right eye: No discharge.        Left eye: No discharge.     Conjunctiva/sclera: Conjunctivae normal.     Right eye: Right conjunctiva is not injected. No chemosis.    Left eye: Left conjunctiva is not injected. No chemosis.    Pupils: Pupils are equal, round, and reactive to light.  Cardiovascular:     Rate and Rhythm: Normal rate and regular rhythm.     Heart sounds: Normal heart sounds.  Pulmonary:     Effort: Pulmonary effort is normal. No tachypnea, accessory muscle usage or respiratory distress.     Breath sounds: Examination of the right-middle field reveals wheezing. Examination of the left-middle field reveals wheezing. Examination of the right-lower field reveals wheezing. Examination of the left-lower field reveals wheezing. Decreased breath sounds and wheezing present. No rhonchi or rales.     Comments: Expiratory wheezing heard in all lung fields. Chest:     Chest wall: No tenderness.  Abdominal:     Tenderness: There is no abdominal tenderness. There is no guarding or rebound.  Lymphadenopathy:     Head:     Right side of head: No submandibular, tonsillar or occipital adenopathy.     Left side of head: No submandibular, tonsillar or occipital adenopathy.     Cervical: No cervical adenopathy.  Skin:    Coloration: Skin is not pale.     Findings: No abrasion, erythema, petechiae or rash. Rash is not papular, urticarial or vesicular.  Neurological:     Mental Status: She is alert.  Psychiatric:        Behavior: Behavior is cooperative.     Diagnostic studies:    Spirometry: results abnormal (FEV1: 1.39/63%, FVC: 1.93/73%, FEV1/FVC: 72%).    Spirometry consistent with possible  restrictive disease. Xopenex four puffs via MDI treatment given in clinic with significant improvement in FEV1 per ATS criteria.  Allergy Studies:     Airborne Adult Perc - 12/08/21 0959     Time Antigen Placed 2725    Allergen Manufacturer Lavella Hammock    Location Back    Number of Test 59    1. Control-Buffer 50% Glycerol Negative    2. Control-Histamine 1 mg/ml 2+    3. Albumin saline Negative    4. Haskell Negative    5. [place] Negative    6. Johnson Negative    7. Platte Blue Negative    8. Meadow Fescue Negative    9. Perennial Rye Negative    10. Sweet Vernal Negative    11. Timothy Negative    12. Cocklebur Negative    13. Burweed Marshelder Negative    14. Ragweed, short Negative    15. Ragweed, Giant Negative    16. Plantain,  English Negative    17. Lamb's Quarters Negative    18. Sheep Sorrell Negative    19. Rough Pigweed Negative    20. Marsh Elder, Rough Negative    21. Mugwort, Common Negative    22. Ash mix Negative    23. Birch mix Negative    24. Steva Colder American Negative  25. Box, Elder Negative    26. Cedar, red Negative    27. Cottonwood, [place] Negative    28. Elm mix Negative    29. Hickory Negative    30. Maple mix Negative    31. Oak, [place] mix Negative    32. Pecan Pollen Negative    33. Pine mix Negative    34. Sycamore Eastern Negative    35. [place], Black Pollen Negative    36. Alternaria alternata Negative    37. Cladosporium Herbarum Negative    38. Aspergillus mix Negative    39. Penicillium mix Negative    40. Bipolaris sorokiniana (Helminthosporium) Negative    41. Drechslera spicifera (Curvularia) Negative    42. Mucor plumbeus Negative    43. Fusarium moniliforme Negative    44. Aureobasidium pullulans (pullulara) Negative    45. Rhizopus oryzae Negative    46. Botrytis cinera Negative    47. Epicoccum nigrum Negative    48. Phoma betae Negative    49. Candida Albicans Negative    50. Trichophyton mentagrophytes Negative     51. Mite, D Farinae  5,000 AU/ml 4+    52. Mite, D Pteronyssinus  5,000 AU/ml 4+    53. Cat Hair 10,000 BAU/ml Negative    54.  Dog Epithelia Negative    55. Mixed Feathers Negative    56. Horse Epithelia Negative    57. Cockroach, German Negative    58. Mouse Negative    59. Tobacco Leaf Negative             Intradermal - 12/08/21 1051     Time Antigen Placed 1100    Allergen Manufacturer Greer    Location Arm    Number of Test 14    Control Negative    Guatem[place] Negative    Johnson Negative    7 Grass Negative    Ragweed mix Negative    Weed mix Negative    Tree mix Negative    Mold 1 Negative    Mold 2 Negative    Mold 3 Negative    Mold 4 Negative    Cat 4+    Dog Negative    Cockroach 4+             Allergy testing results were read and interpreted by myself, documented by clinical staff.         Salvatore Marvel, MD [facility]

## 2021-12-08 NOTE — Patient Instructions (Addendum)
1. Moderate persistent asthma, uncomplicated - with elevated eosinophils - Lung testing looked fairly good, but it did improve with the albuterol treatment. - I am going to restart you on a medication that contains a long acting albuterol combined with an inhaled steroid called Symbicort. - This is softer than the Advair and hopefully will not cause the sore throat. - We are also going to get some lab work to rule out serious causes of asthma. - You did have elevated eosinophils (white blood cells that do nothing useful aside from making asthma worse). - We may consider adding on Fasenra for long term control of your breathing (attacks eosinophils and can make your breathing better).  - Information on Fasenra provided.  - Spacer sample and demonstration provided. - Daily controller medication(s): Symbicort 160/4.50mg two puffs twice daily with spacer - Prior to physical activity: albuterol 2 puffs 10-15 minutes before physical activity. - Rescue medications: albuterol 4 puffs every 4-6 hours as needed - Asthma control goals:  * Full participation in all desired activities (may need albuterol before activity) * Albuterol use two time or less a week on average (not counting use with activity) * Cough interfering with sleep two time or less a month * Oral steroids no more than once a year * No hospitalizations  2. Perennial allergic rhinitis - Testing today showed: dust mites, cat, and cockroach. - Copy of test results provided.  - Avoidance measures provided. - Stop taking: Singulair since you never noticed improvement - Start taking: Xyzal (levocetirizine) '5mg'$  tablet once daily - You can use an extra dose of the antihistamine, if needed, for breakthrough symptoms.  - Consider nasal saline rinses 1-2 times daily to remove allergens from the nasal cavities as well as help with mucous clearance (this is especially helpful to do before the nasal sprays are given) - Consider allergy shots as a  means of long-term control. - Allergy shots "re-train" and "reset" the immune system to ignore environmental allergens and decrease the resulting immune response to those allergens (sneezing, itchy watery eyes, runny nose, nasal congestion, etc).    - Allergy shots improve symptoms in 75-85% of patients.  - We can discuss more at the next appointment if the medications are not working for you.  3. Flexural atopic dermatitis - Continue with triamcinolone 0.1% ointment twice daily as needed. - The addition of the Xyzal should help with itching.  4. GERD - Continue with Protonix daily. - Continue with Pepcid daily.  5. Return in about 4 weeks (around 01/05/2022).    Please inform [place] any Emergency Department visits, hospitalizations, or changes in symptoms. Call [place]efore going to the ED for breathing or allergy symptoms since we might be able to fit you in for a sick visit. Feel free to contact u[place]anytime with any questions, problems, or concerns.  It was a pleasure to meet you today!  Websites that have reliable patient information: 1. American Academy of Asthma, Allergy, and Immunology: www.aaaai.org 2. Food Allergy Research and Education (FARE): foodallergy.org 3. Mothers of Asthmatics: http://www.asthmacommunitynetwork.org 4. American College of Allergy, Asthma, and Immunology: www.acaai.org   COVID-19 Vaccine Information can be found at: [email or web address] questions related to vaccine distribution or appointments, please email [email or web address] or call [phone]   We realize that you might be concerned about having an allergic reaction to the COVID19 vaccines. To help with that concern, WE ARE OFFERING THE COVID19 VACCINES IN OUR OFFICE! Ask the front desk for dates!     "  Like" [place] on Facebook and Instagram for our latest updates!      A healthy democracy works best when [employer] participate! Make sure  you are registered to vote! If you have moved or changed any of your contact information, you will need to get this updated before voting!  In some cases, you MAY be able to register to vote online: [email or web address]       Airborne Adult Perc - 12/08/21 0959     Time Antigen Placed [place]ella [place]    Location Back    Number of Test 59    1. Control-Buffer 50% Glycerol Negative    2. Control-Histamine 1 mg/ml 2+    3. Albumin saline Negative    4. [place] Negative    5. [place] Negative    6. Johnson Negative    7. [place] Blue Negative    8. Meadow Fescue Negative    9. Perennial Rye Negative    10. Sweet Vernal Negative    11. Timothy Negative    12. Cocklebur Negative    13. Burweed Marshelder Negative    14. Ragweed, short Negative    15. Ragweed, Giant Negative    16. Plantain,  English Negative    17. Lamb's Quarters Negative    18. Sheep Sorrell Negative    19. Rough Pigweed Negative    20. Marsh Elder, Rough Negative    21. Mugwort, Common Negative    22. Ash mix Negative    23. Birch mix Negative    24. Beech American Negative    25. Box, Elder Negative    26. Cedar, red Negative    27. Cottonwood, [place] Negative    28. Elm mix Negative    29. Hickory Negative    30. Maple mix Negative    31. Oak, [place] mix Negative    32. Pecan Pollen Negative    33. Pine mix Negative    34. Sycamore Eastern Negative    35. [place], Black Pollen Negative    36. Alternaria alternata Negative    37. Cladosporium Herbarum Negative    38. Aspergillus mix Negative    39. Penicillium mix Negative    40. Bipolaris sorokiniana (Helminthosporium) Negative    41. Drechslera spicifera (Curvularia) Negative    42. Mucor plumbeus Negative    43. Fusarium moniliforme Negative    44. Aureobasidium pullulans (pullulara) Negative    45. Rhizopus oryzae Negative    46. Botrytis cinera Negative    47. Epicoccum nigrum Negative     48. Phoma betae Negative    49. Candida Albicans Negative    50. Trichophyton mentagrophytes Negative    51. Mite, D Farinae  5,000 AU/ml 4+    52. Mite, D Pteronyssinus  5,000 AU/ml 4+    53. Cat Hair 10,000 BAU/ml Negative    54.  Dog Epithelia Negative    55. Mixed Feathers Negative    56. Horse Epithelia Negative    57. Cockroach, German Negative    58. Mouse Negative    59. Tobacco Leaf Negative             Intradermal - 12/08/21 1051     Time Antigen Placed 1100    Allergen Manufacturer Greer    Location Arm    Number of Test 14    Control Negative    Guatem[place] Negative    Johnson Negative    7 Grass Negative    Ragweed mix Negative  Weed mix Negative    Tree mix Negative    Mold 1 Negative    Mold 2 Negative    Mold 3 Negative    Mold 4 Negative    Cat 4+    Dog Negative    Cockroach 4+            Control of Dust Mite Allergen    Dust mites play a major role in allergic asthma and rhinitis.  They occur in environments with high humidity wherever human skin is found.  Dust mites absorb humidity from the atmosphere (ie, they do not drink) and feed on organic matter (including shed human and animal skin).  Dust mites are a microscopic type of insect that you cannot see with the naked eye.  High levels of dust mites have been detected from mattresses, pillows, carpets, upholstered furniture, bed covers, clothes, soft toys and any woven material.  The principal allergen of the dust mite is found in its feces.  A gram of dust may contain 1,000 mites and 250,000 fecal particles.  Mite antigen is easily measured in the air during house cleaning activities.  Dust mites do not bite and do not cause harm to humans, other than by triggering allergies/asthma.    Ways to decrease your exposure to dust mites in your home:  Encase mattresses, box springs and pillows with a mite-impermeable barrier or cover   Wash sheets, blankets and drapes weekly in hot water (130 F) with  detergent and dry them in a dryer on the hot setting.  Have the room cleaned frequently with a vacuum cleaner and a damp dust-mop.  For carpeting or rugs, vacuuming with a vacuum cleaner equipped with a high-efficiency particulate air (HEPA) filter.  The dust mite allergic individual should not be in a room which is being cleaned and should wait 1 hour after cleaning before going into the room. Do not sleep on upholstered furniture (eg, couches).   If possible removing carpeting, upholstered furniture and drapery from the home is ideal.  Horizontal blinds should be eliminated in the rooms where the person spends the most time (bedroom, study, television room).  Washable vinyl, roller-type shades are optimal. Remove all non-washable stuffed toys from the bedroom.  Wash stuffed toys weekly like sheets and blankets above.   Reduce indoor humidity to less than 50%.  Inexpensive humidity monitors can be purchased at most hardware stores.  Do not use a humidifier as can make the problem worse and are not recommended.  Control of Dog or Cat Allergen  Avoidance is the best way to manage a dog or cat allergy. If you have a dog or cat and are allergic to dog or cats, consider removing the dog or cat from the home. If you have a dog or cat but don't want to find it a new home, or if your family wants a pet even though someone in the household is allergic, here are some strategies that may help keep symptoms at bay:  Keep the pet out of your bedroom and restrict it to only a few rooms. Be advised that keeping the dog or cat in only one room will not limit the allergens to that room. Don't pet, hug or kiss the dog or cat; if you do, wash your hands with soap and water. High-efficiency particulate air (HEPA) cleaners run continuously in a bedroom or living room can reduce allergen levels over time. Regular use of a high-efficiency vacuum cleaner or a central vacuum  can reduce allergen levels. Giving your dog or  cat a bath at least once a week can reduce airborne allergen.  Control of Cockroach Allergen  Cockroach allergen has been identified as an important cause of acute attacks of asthma, especially in urban settings.  There are fifty-five species of cockroach that exist in the [place], however only three, the [place], [place] species produce allergen that can affect patients with Asthma.  Allergens can be obtained from fecal particles, egg casings and secretions from cockroaches.    Remove food sources. Reduce access to water. Seal access and entry points. Spray runways with 0.5-1% Diazinon or Chlorpyrifos Blow boric acid power under stoves and refrigerator. Place bait stations (hydramethylnon) at feeding sites.

## 2021-12-09 ENCOUNTER — Ambulatory Visit: Payer: Medicaid Other | Admitting: Pulmonary Disease

## 2021-12-09 MED ORDER — LEVALBUTEROL TARTRATE 45 MCG/ACT IN AERO: 2.0000 | INHALATION_SPRAY | RESPIRATORY_TRACT | 1 refills | Status: DC | PRN

## 2021-12-09 NOTE — Progress Notes (Signed)
Patient called back today requesting that we send in Erica [name] for her. She feels that this worked better when she got it in the office yesterday. Sent in prescription.  Salvatore Marvel, MD [facility]

## 2021-12-09 NOTE — Addendum Note (Signed)
Addended by: Valentina Shaggy on: 12/09/2021 09:09 AM   Modules accepted: Orders

## 2021-12-10 ENCOUNTER — Encounter: Payer: Self-pay | Admitting: Pulmonary Disease

## 2021-12-10 ENCOUNTER — Ambulatory Visit (INDEPENDENT_AMBULATORY_CARE_PROVIDER_SITE_OTHER): Payer: Medicaid Other | Admitting: Pulmonary Disease

## 2021-12-10 VITALS — BP 136/78 | HR 78 | Temp 97.6°F | Ht 60.0 in | Wt 193.6 lb

## 2021-12-10 DIAGNOSIS — G4733 Obstructive sleep apnea (adult) (pediatric): Secondary | ICD-10-CM | POA: Diagnosis not present

## 2021-12-10 DIAGNOSIS — J3089 Other allergic rhinitis: Secondary | ICD-10-CM | POA: Diagnosis not present

## 2021-12-10 DIAGNOSIS — J4550 Severe persistent asthma, uncomplicated: Secondary | ICD-10-CM | POA: Diagnosis not present

## 2021-12-10 MED ORDER — BENZONATATE 200 MG PO CAPS: 200.0000 mg | ORAL_CAPSULE | Freq: Three times a day (TID) | ORAL | 1 refills | Status: DC | PRN

## 2021-12-10 MED ORDER — LEVALBUTEROL HCL 0.63 MG/3ML IN NEBU
0.6300 mg | INHALATION_SOLUTION | Freq: Four times a day (QID) | RESPIRATORY_TRACT | 1 refills | Status: DC | PRN
Start: 2021-12-10 — End: 2023-05-04

## 2021-12-10 NOTE — Progress Notes (Signed)
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  Brief Summary:  Erica [name] is a 46 y.o. female former smoker with asthmatic bronchitis, ground glass opacities on CT chest and obstructive sleep apnea.      Subjective:   She had persistent exacerbations of her asthma.  She had to be hospitalized for this.  She saw Dr. Melvyn Novas multiple times.  She was tried on reflux medication but this didn't do anything.  She finally went to see Dr. Ernst Bowler.  She was found to have allergies to dust mites, cats, and cockroaches.  She has significant elevation in IgE and eosinophils.  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  She is transitioning to a new client.  She feels that her current regimen of symbicort, xyzal, and xopenex work better.  She still has dry cough and feels congested in her sinuses.  She has additional lab work and CT chest ordered.  She also has appointment with ENT coming up.  Because she was having so much trouble with her asthma she wasn't able to be consistent with CPAP therapy.   Physical Exam:   Appearance - well kempt   ENMT - no sinus tenderness, no oral exudate, no LAN, Mallampati 3 airway, no stridor  Respiratory - equal breath sounds bilaterally, no wheezing or  rales  CV - s1s2 regular rate and rhythm, no murmurs  Ext - no clubbing, no edema  Skin - no rashes  Psych - normal mood and affect      Pulmonary Testing:  PFT 04/22/21 >> FEV1 1.95 (89%), FEV1% 90, TLC 3.71 (81%), DLCO 84% IgE 10/17/21 >> 7,995 Allergy test 12/08/21 >> Dust mites, Cat, Cockroach  Chest imaging:  HRCT chest 07/01/20 >> Patchy mild/mod GGO in peripheral upper lobes b/l, diffuse bronchial wall thickening. CT chest 03/21/21 >> b/l patchy opacities  Sleep Tests:  PSG 07/01/20 >> AHI 8.3, SpO2 low 82%.  REM AHI 30.2.  Social History:  She  reports that she quit smoking about 2 weeks ago. Her smoking use included cigarettes. She uses smokeless tobacco. She reports current alcohol use. She reports that she does not use drugs.  Family History:  Her family history includes Allergic rhinitis in her daughter and son; Eczema in her daughter, sister, and son; Hypertension in her mother.     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 - discussed importance of environmental control techniques - she is followed by Dr. Salvatore Marvel - continue symbicort 160 two puffs bid - prn xopenex  Allergic rhinitis. - advised her to start using nasal irrigation nightly before using flonase - continue xyzal  Ground glass opacities seen on CT chest. - most recent chest xrays have  been clear - she has CT chest, HP panel, Aspergillus percipitins, A1AT, and ANCA ordered by Dr. Ernst Bowler  Chronic cough. - most likely related to allergies and asthma - uncertain whether reflux is contributing; she can stop pantoprazole and if she does okay, then she can stop famotidine after 1 week - tessalon 200 mg tid prn - she has appointment with ENT coming up  Tobacco abuse. - encouraged her to keep up with smoking abstinence  Obstructive sleep apnea. - now that her asthma is better controlled, advised her to resume CPAP therapy - gets supplies  from [employer] - CPAP ordered 02/05/21  Time Spent Involved in Patient Care on Day of Examination:  45 minutes  Follow up:   Patient Instructions  Stop pantoprazole.  If you still feel okay, then you can stop famotidine after one week.  Benzonatate 200 mg every 8 hours as needed to help with cough.  Try using nasal irrigation at night before using flonase.  Try to start using CPAP again.  Follow up in 2 to 3 months.  Medication List:   Allergies as of 12/10/2021       Reactions   Strawberry (diagnostic) Swelling        Medication List        Accurate as of December 10, 2021  9:25 AM. If you have any questions, ask your nurse or doctor.          STOP taking these medications    albuterol (2.5 MG/3ML) 0.083% nebulizer solution Commonly known as: PROVENTIL Stopped by: Chesley Mires, MD   albuterol 108 (90 Base) MCG/ACT inhaler Commonly known as: Ventolin HFA Stopped by: Chesley Mires, MD   Combivent Respimat 20-100 MCG/ACT Aers respimat Generic drug: Ipratropium-Albuterol Stopped by: Chesley Mires, MD   montelukast 10 MG tablet Commonly known as: SINGULAIR Stopped by: Chesley Mires, MD   pantoprazole 40 MG tablet Commonly known as: Protonix Stopped by: Chesley Mires, MD       TAKE these medications    benzonatate 200 MG capsule Commonly known as: TESSALON Take 1 capsule (200 mg total) by mouth 3 (three) times daily as needed for cough. Started by: Chesley Mires, MD   budesonide-formoterol 160-4.5 MCG/ACT inhaler Commonly known as: Symbicort Inhale 2 puffs into the lungs 2 (two) times daily. With Spacer   diltiazem 180 MG 24 hr capsule Commonly known as: CARDIZEM CD Take 180 mg by mouth daily.   famotidine 20 MG tablet Commonly known as: Pepcid One after supper What changed:  how much to take how to take this when to take this additional instructions   fluticasone 50 MCG/ACT nasal spray Commonly known as: FLONASE Place 1 spray into both nostrils daily.    levalbuterol 0.63 MG/3ML nebulizer solution Commonly known as: XOPENEX Take 3 mLs (0.63 mg total) by nebulization every 6 (six) hours as needed for wheezing or shortness of breath. What changed:  how much to take when to take this reasons to take this Changed by: Chesley Mires, MD   levalbuterol 45 MCG/ACT inhaler Commonly known as: XOPENEX HFA Inhale 2 puffs into the lungs every 4 (four) hours as needed for wheezing.   levocetirizine 5 MG tablet Commonly known as: XYZAL Take 1 tablet (5 mg total) by mouth 2 (two) times daily as needed.        Signature:  Chesley Mires, MD [place] Pager - [phone] 12/10/2021, 9:25 AM

## 2021-12-10 NOTE — Patient Instructions (Signed)
Stop pantoprazole.  If you still feel okay, then you can stop famotidine after one week.  Benzonatate 200 mg every 8 hours as needed to help with cough.  Try using nasal irrigation at night before using flonase.  Try to start using CPAP again.  Follow up in 2 to 3 months.

## 2022-01-14 ENCOUNTER — Ambulatory Visit: Payer: Medicaid Other | Admitting: Allergy & Immunology

## 2022-02-10 ENCOUNTER — Ambulatory Visit: Payer: Medicaid Other | Admitting: Pulmonary Disease

## 2022-07-14 ENCOUNTER — Other Ambulatory Visit: Payer: Self-pay | Admitting: Allergy & Immunology

## 2022-09-14 IMAGING — CT CT CHEST HIGH RESOLUTION W/O CM
2 of 7 series · 15 of 36 positions shown, 18 images · non-contrast
Comparison: 06/11/2020 chest radiograph.

CLINICAL DATA: Chronic cough and dyspnea with abnormal chest
radiograph concerning for interstitial lung disease.

EXAM:
CT CHEST WITHOUT CONTRAST
TECHNIQUE: Multidetector CT imaging of the chest was performed following the
standard protocol without intravenous contrast. High resolution
imaging of the lungs, as well as inspiratory and expiratory imaging,
was performed.

[Series 4: high resolution retro · axial · 0.69mm/px · z∈[-275,-14]mm · 12 of 309 slices shown, 15 images]
[im 24/309  mediastinal]
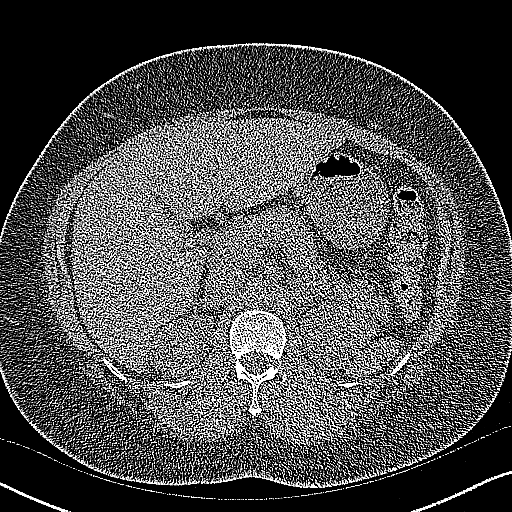
[im 24/309  lung]
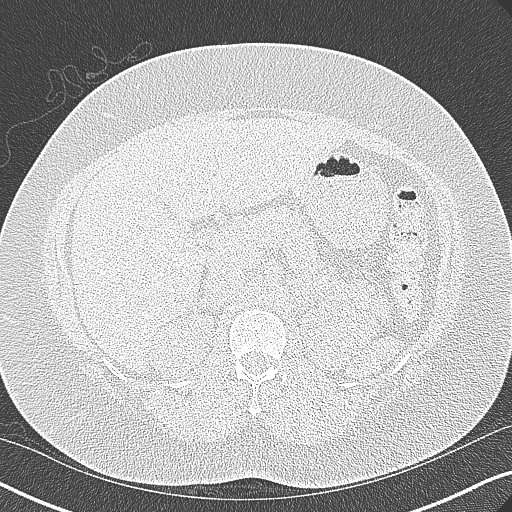
[im 48/309  lung]
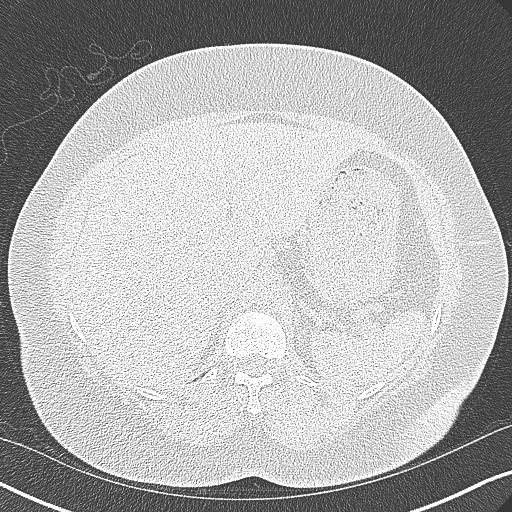
[im 72/309  lung]
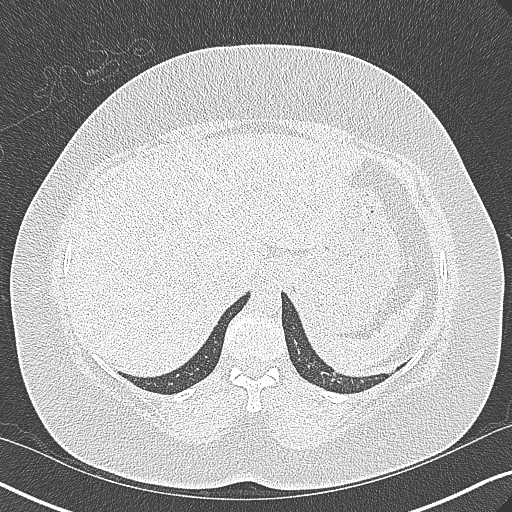
[im 95/309  lung]
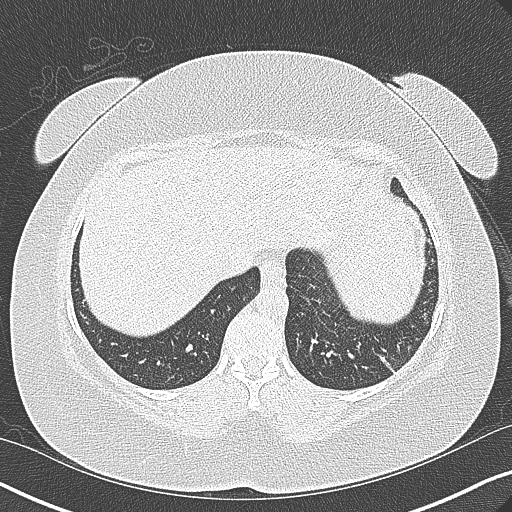
[im 119/309  mediastinal]
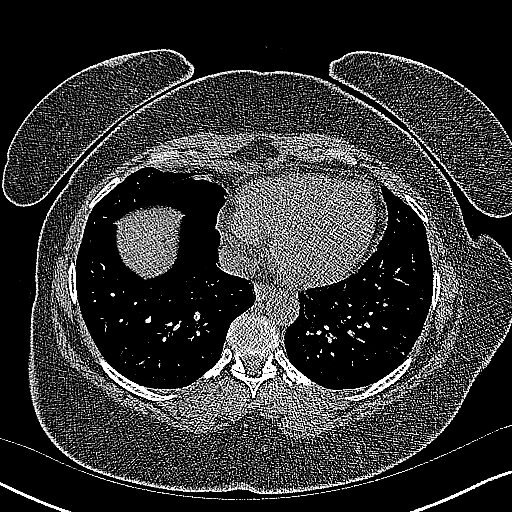
[im 119/309  lung]
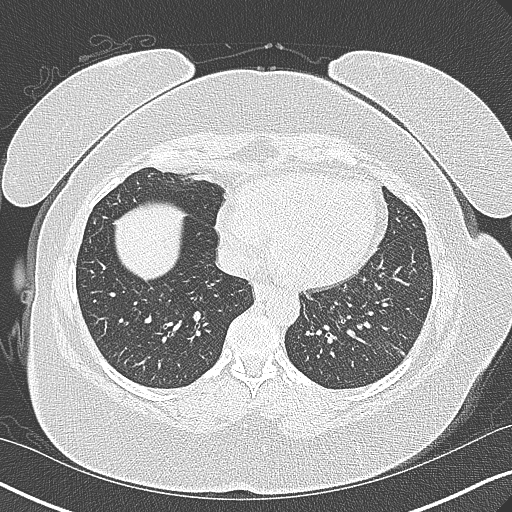
[im 143/309  lung]
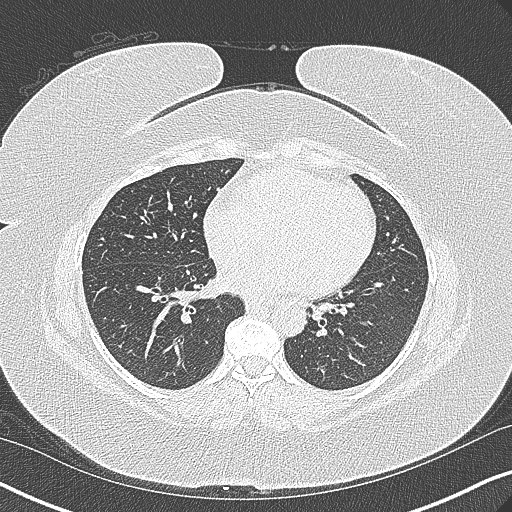
[im 166/309  lung]
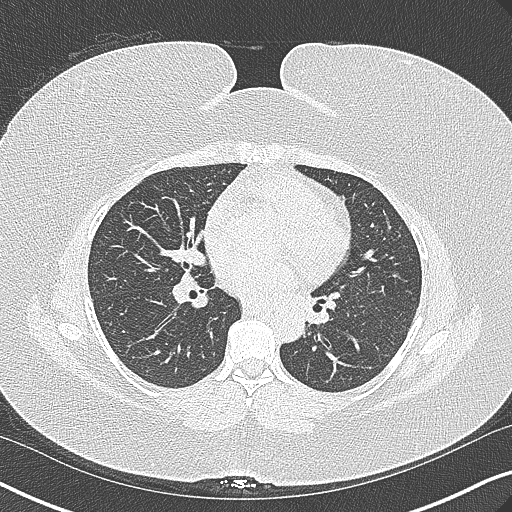
[im 190/309  lung]
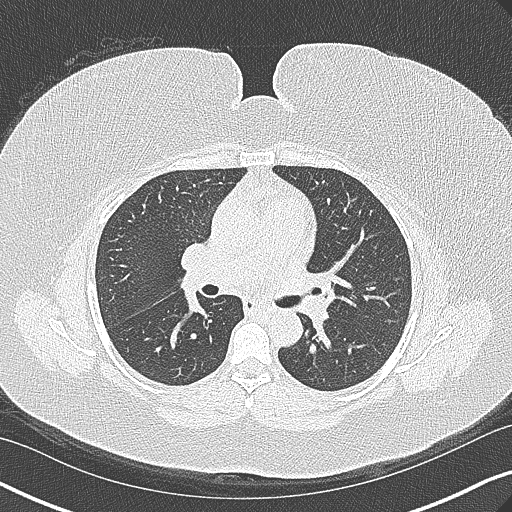
[im 214/309  mediastinal]
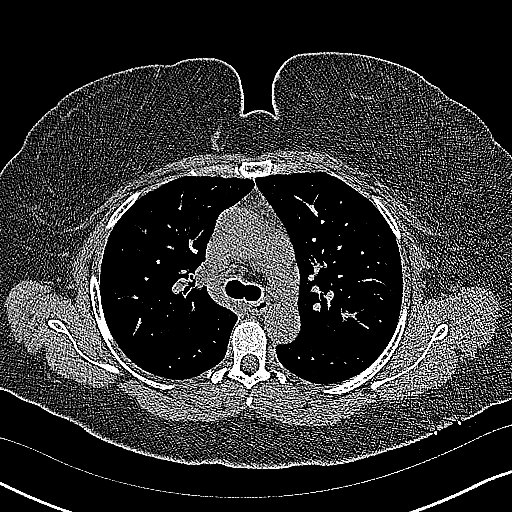
[im 214/309  lung]
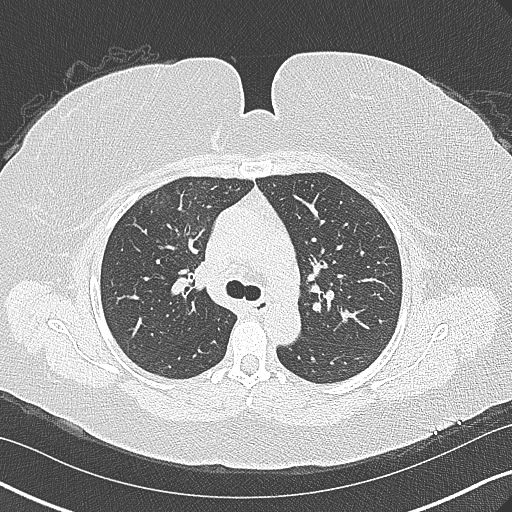
[im 237/309  lung]
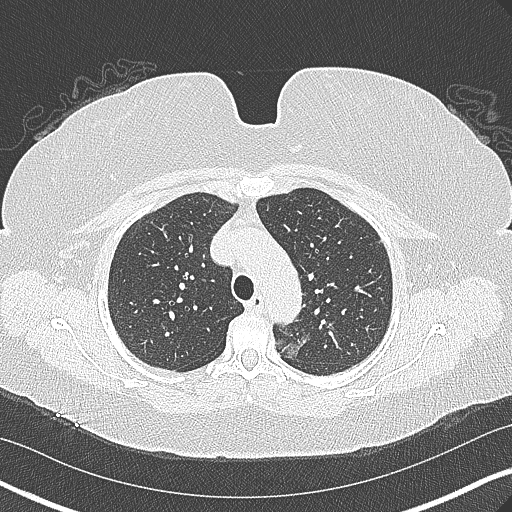
[im 261/309  lung]
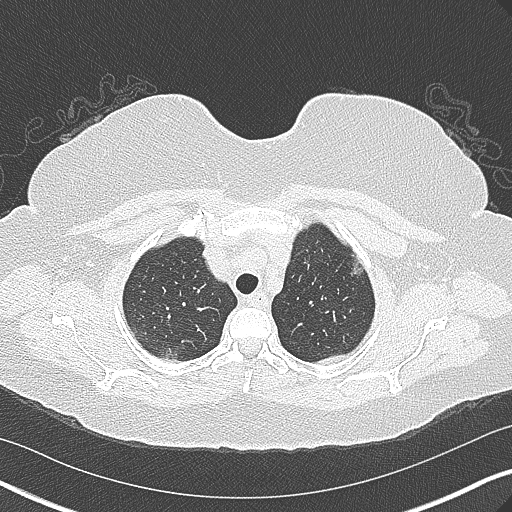
[im 285/309  lung]
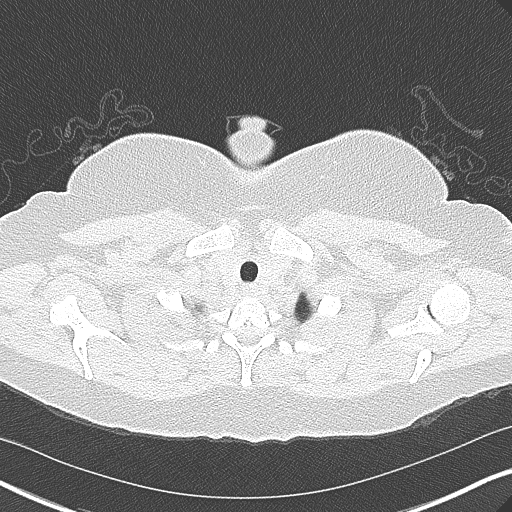

[Series 7: coronal · coronal · 0.63mm/px · 3 of 104 slices shown]
[im 21/104  lung]
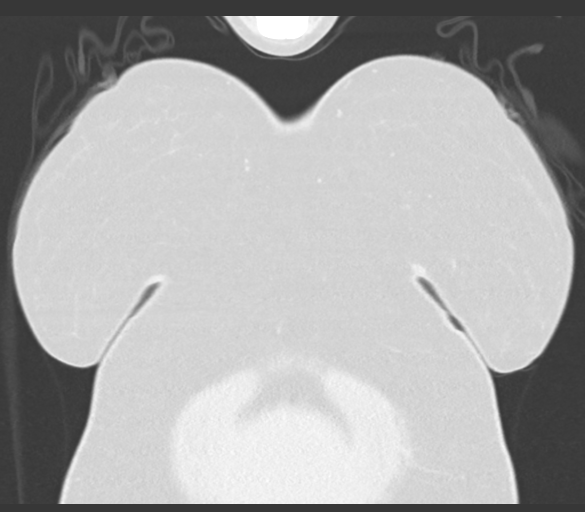
[im 42/104  lung]
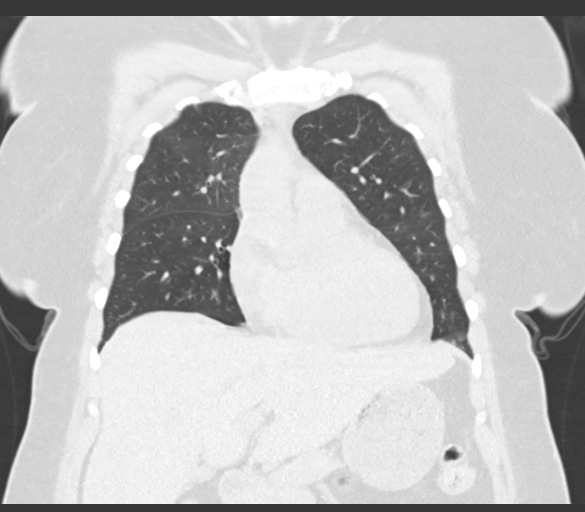
[im 62/104  lung]
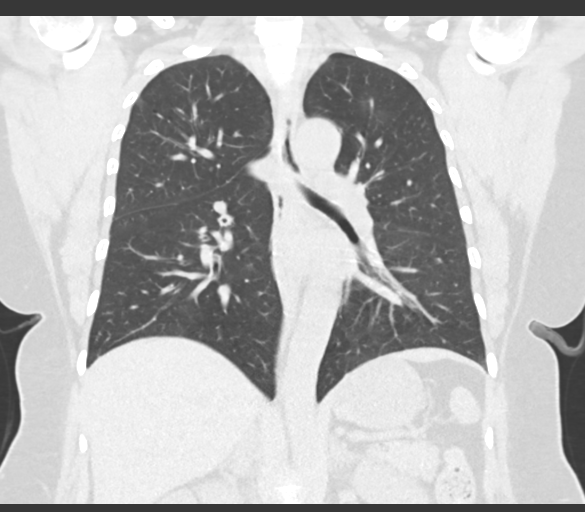

[15 of 36 positions shown; findings below may reference images not displayed]

FINDINGS: Cardiovascular: Normal heart size. No significant pericardial
effusion/thickening. Great vessels are normal in course and caliber.

Mediastinum/Nodes: No discrete thyroid nodules. Unremarkable
esophagus. No pathologically enlarged axillary, mediastinal or hilar
lymph nodes, noting limited sensitivity for the detection of hilar
adenopathy on this noncontrast study.

Lungs/Pleura: No pneumothorax. No pleural effusion. No acute
consolidative airspace disease, lung masses or significant pulmonary
nodules. No significant air trapping or evidence of
tracheobronchomalacia on the expiration sequence. Patchy
mild-to-moderate ground-glass opacities in the peripheral upper
lobes bilaterally. Diffuse bronchial wall thickening. No significant
regions of subpleural reticulation, traction bronchiectasis,
architectural distortion or frank honeycombing.

Upper abdomen: No acute abnormality.

Musculoskeletal:  No aggressive appearing focal osseous lesions.
IMPRESSION: 1. Nonspecific diffuse bronchial wall thickening, as can be seen
with chronic bronchitis or reactive airways disease.
2. Nonspecific patchy ground-glass opacities in peripheral upper
lobes. Differential is broad and includes mild postinflammatory
fibrosis, atypical/viral infection (including [id number]), drug
reaction and toxic inhalational exposure. Follow-up high-resolution
chest CT may be obtained in 6-12 months to assess temporal pattern
stability as clinically warranted.

## 2022-12-15 ENCOUNTER — Encounter (HOSPITAL_COMMUNITY): Payer: Self-pay

## 2022-12-15 ENCOUNTER — Emergency Department (HOSPITAL_COMMUNITY)
Admission: EM | Admit: 2022-12-15 | Discharge: 2022-12-16 | Disposition: A | Discharge status: Home / Self Care | Payer: Medicaid Other | Attending: Emergency Medicine | Admitting: Emergency Medicine

## 2022-12-15 ENCOUNTER — Other Ambulatory Visit: Payer: Self-pay

## 2022-12-15 DIAGNOSIS — Z87891 Personal history of nicotine dependence: Secondary | ICD-10-CM | POA: Diagnosis not present

## 2022-12-15 DIAGNOSIS — A599 Trichomoniasis, unspecified: Secondary | ICD-10-CM | POA: Diagnosis not present

## 2022-12-15 DIAGNOSIS — J45909 Unspecified asthma, uncomplicated: Secondary | ICD-10-CM | POA: Insufficient documentation

## 2022-12-15 DIAGNOSIS — R079 Chest pain, unspecified: Secondary | ICD-10-CM | POA: Diagnosis not present

## 2022-12-15 DIAGNOSIS — N939 Abnormal uterine and vaginal bleeding, unspecified: Secondary | ICD-10-CM | POA: Insufficient documentation

## 2022-12-15 LAB — COMPREHENSIVE METABOLIC PANEL
ALT: 16 U/L (ref 0–44)
AST: 15 U/L (ref 15–41)
Albumin: 3.8 g/dL (ref 3.5–5.0)
Alkaline Phosphatase: 81 U/L (ref 38–126)
Anion gap: 9 (ref 5–15)
BUN: 18 mg/dL (ref 6–20)
CO2: 26 mmol/L (ref 22–32)
Calcium: 9 mg/dL (ref 8.9–10.3)
Chloride: 105 mmol/L (ref 98–111)
Creatinine, Ser: 0.83 mg/dL (ref 0.44–1.00)
GFR, Estimated: >60 mL/min (ref >60)
Glucose, Bld: 107 mg/dL — ABNORMAL HIGH (ref 70–99)
Potassium: 3.5 mmol/L (ref 3.5–5.1)
Sodium: 140 mmol/L (ref 135–145)
Total Bilirubin: 0.4 mg/dL (ref 0.3–1.2)
Total Protein: 7.3 g/dL (ref 6.5–8.1)

## 2022-12-15 LAB — CBC WITH DIFFERENTIAL/PLATELET
Abs Immature Granulocytes: 0.01 10*3/uL (ref 0.00–0.07)
Basophils Absolute: 0 10*3/uL (ref 0.0–0.1)
Basophils Relative: 0 %
Eosinophils Absolute: 0.2 10*3/uL (ref 0.0–0.5)
Eosinophils Relative: 2 %
HCT: 37.9 % (ref 36.0–46.0)
Hemoglobin: 12.2 g/dL (ref 12.0–15.0)
Immature Granulocytes: 0 %
Lymphocytes Relative: 50 %
Lymphs Abs: 4.5 10*3/uL — ABNORMAL HIGH (ref 0.7–4.0)
MCH: 31.6 pg (ref 26.0–34.0)
MCHC: 32.2 g/dL (ref 30.0–36.0)
MCV: 98.2 fL (ref 80.0–100.0)
Monocytes Absolute: 0.4 10*3/uL (ref 0.1–1.0)
Monocytes Relative: 5 %
Neutro Abs: 3.9 10*3/uL (ref 1.7–7.7)
Neutrophils Relative %: 43 %
Platelets: 291 10*3/uL (ref 150–400)
RBC: 3.86 MIL/uL — ABNORMAL LOW (ref 3.87–5.11)
RDW: 13.6 % (ref 11.5–15.5)
WBC: 9 10*3/uL (ref 4.0–10.5)
nRBC: 0 % (ref 0.0–0.2)

## 2022-12-15 MED ORDER — SODIUM CHLORIDE 0.9 % IV BOLUS
1000.0000 mL | Freq: Once | INTRAVENOUS | Status: AC
  Administered 2022-12-15: 1000 mL via INTRAVENOUS

## 2022-12-15 NOTE — ED Triage Notes (Addendum)
Pt presents with vaginal bleeding that started today. States bleeding is getting worse throughout day. Pt had a hysterectomy in 2015. Denies any abdominal pain but states she has had "acid reflux" the last few days.

## 2022-12-16 ENCOUNTER — Emergency Department (HOSPITAL_COMMUNITY): Payer: Medicaid Other

## 2022-12-16 LAB — URINALYSIS, ROUTINE W REFLEX MICROSCOPIC
Bacteria, UA: NONE SEEN
Bilirubin Urine: NEGATIVE
Glucose, UA: NEGATIVE mg/dL
Ketones, ur: NEGATIVE mg/dL
Nitrite: NEGATIVE
Protein, ur: 30 mg/dL — AB
Specific Gravity, Urine: 1.023 (ref 1.005–1.030)
pH: 6 (ref 5.0–8.0)

## 2022-12-16 LAB — WET PREP, GENITAL
Sperm: NONE SEEN
WBC, Wet Prep HPF POC: >=10 — AB (ref <10)
Yeast Wet Prep HPF POC: NONE SEEN

## 2022-12-16 LAB — HCG, QUANTITATIVE, PREGNANCY: hCG, Beta Chain, Quant, S: 2 m[IU]/mL (ref <5)

## 2022-12-16 LAB — TROPONIN I (HIGH SENSITIVITY): Troponin I (High Sensitivity): 3 ng/L (ref <18)

## 2022-12-16 MED ORDER — HYDROCODONE-ACETAMINOPHEN 5-325 MG PO TABS
1.0000 | ORAL_TABLET | Freq: Once | ORAL | Status: AC
  Administered 2022-12-16: 1 via ORAL
  Filled 2022-12-16: qty 1

## 2022-12-16 MED ORDER — OMEPRAZOLE 20 MG PO CPDR: 20.0000 mg | DELAYED_RELEASE_CAPSULE | Freq: Every day | ORAL | 1 refills | Status: DC

## 2022-12-16 MED ORDER — LIDOCAINE HCL (PF) 1 % IJ SOLN
2.0000 mL | Freq: Once | INTRAMUSCULAR | Status: AC
  Administered 2022-12-16: 1 mL
  Filled 2022-12-16: qty 5

## 2022-12-16 MED ORDER — IOHEXOL 300 MG/ML  SOLN
100.0000 mL | Freq: Once | INTRAMUSCULAR | Status: AC | PRN
  Administered 2022-12-16: 100 mL via INTRAVENOUS

## 2022-12-16 MED ORDER — METRONIDAZOLE 500 MG PO TABS: 500.0000 mg | ORAL_TABLET | Freq: Two times a day (BID) | ORAL | 0 refills | Status: AC

## 2022-12-16 MED ORDER — CEFTRIAXONE SODIUM 500 MG IJ SOLR
500.0000 mg | Freq: Once | INTRAMUSCULAR | Status: AC
  Administered 2022-12-16: 500 mg via INTRAMUSCULAR
  Filled 2022-12-16: qty 500

## 2022-12-16 MED ORDER — DOXYCYCLINE HYCLATE 100 MG PO TABS
100.0000 mg | ORAL_TABLET | Freq: Once | ORAL | Status: AC
  Administered 2022-12-16: 100 mg via ORAL
  Filled 2022-12-16: qty 1

## 2022-12-16 MED ORDER — DOXYCYCLINE HYCLATE 100 MG PO CAPS: 100.0000 mg | ORAL_CAPSULE | Freq: Two times a day (BID) | ORAL | 0 refills | Status: AC

## 2022-12-16 MED ORDER — ALUM & MAG HYDROXIDE-SIMETH 200-200-20 MG/5ML PO SUSP
30.0000 mL | Freq: Once | ORAL | Status: AC
  Administered 2022-12-16: 30 mL via ORAL
  Filled 2022-12-16: qty 30

## 2022-12-16 MED ORDER — METRONIDAZOLE 500 MG PO TABS
500.0000 mg | ORAL_TABLET | Freq: Once | ORAL | Status: AC
  Administered 2022-12-16: 500 mg via ORAL
  Filled 2022-12-16: qty 1

## 2022-12-16 NOTE — ED Notes (Signed)
Patient transported to CT 

## 2022-12-16 NOTE — Discharge Instructions (Signed)
You were evaluated in the Emergency Department and after careful evaluation, we did not find any emergent condition requiring admission or further testing in the hospital.  Your exam/testing today is overall reassuring.  You tested positive for an STD.  This may be causing your vaginal bleeding.  Take the doxycycline and metronidazole antibiotics as directed.  If you complete these medicines and you are still having bleeding would recommend follow-up with a OB/GYN doctor.  For your chest pain, sounds like it is due to acid reflux.  Take the omeprazole daily to see if it helps.  Recommend close follow-up with your primary care doctor to discuss all of your symptoms.  Please return to the Emergency Department if you experience any worsening of your condition.   Thank you for allowing [place] to be a part of your care.

## 2022-12-16 NOTE — ED Provider Notes (Signed)
AP-EMERGENCY DEPT [facility] Emergency Department Provider Note MRN:  [id number]  Arrival date & time: 12/16/22     Chief Complaint   Vaginal bleeding History of Present Illness   Erica [name] is a 47 y.o. year-old female with a history of hysterectomy presenting to the ED with chief complaint of vaginal bleeding.  Vaginal bleeding for several hours today.  Had a hysterectomy several years ago, has not bled since.  Has had some low back pain recently but otherwise no symptoms.  Denies abdominal pain, no fever.  Review of Systems  A thorough review of systems was obtained and all systems are negative except as noted in the HPI and PMH.   Patient's Health History    Past Medical History:  Diagnosis Date   Asthma    Eczema    Pelvic inflammatory disease    Tubo-ovarian abscess     Past Surgical History:  Procedure Laterality Date   ABDOMINAL HYSTERECTOMY N/A 07/07/2013   Procedure: HYSTERECTOMY ABDOMINAL with bilateral salpingo-oophorectomy;  Surgeon: Adam Phenix, MD;  Location: [facility];  Service: Gynecology;  Laterality: N/A;   CESAREAN SECTION     DILATION AND EVACUATION N/A 02/03/2013   Procedure: DILATATION AND EVACUATION;  Surgeon: Adam Phenix, MD;  Location: [facility];  Service: Gynecology;  Laterality: N/A;    Family History  Problem Relation Age of Onset   Hypertension Mother    Eczema Sister    Allergic rhinitis Daughter    Eczema Daughter    Allergic rhinitis Son    Eczema Son     Social History   Socioeconomic History   Marital status: Single    Spouse name: Not on file   Number of children: Not on file   Years of education: Not on file   Highest education level: Not on file  Occupational History   Not on file  Tobacco Use   Smoking status: Former    Years: 15    Types: Cigarettes    Quit date: 11/24/2021    Years since quitting: 1.0   Smokeless tobacco: Current   Tobacco comments:    Quit smoking 09/12/2021  Vaping Use   Vaping Use: Every  day   Substances: Nicotine, Flavoring  Substance and Sexual Activity   Alcohol use: Yes    Comment: occ   Drug use: No   Sexual activity: Not Currently    Partners: Male    Birth control/protection: None  Other Topics Concern   Not on file  Social History Narrative   Not on file   Social Determinants of Health   Financial Resource Strain: Not on file  Food Insecurity: Not on file  Transportation Needs: Not on file  Physical Activity: Not on file  Stress: Not on file  Social Connections: Not on file  Intimate Partner Violence: Not on file     Physical Exam   Vitals:   12/16/22 0300 12/16/22 0330  BP:    Pulse: 73 73  Resp: 13 19  Temp:    SpO2: 100% 100%    CONSTITUTIONAL: Well-appearing, NAD NEURO/PSYCH:  Alert and oriented x 3, no focal deficits EYES:  eyes equal and reactive ENT/NECK:  no LAD, no JVD CARDIO: Regular rate, well-perfused, normal S1 and S2 PULM:  CTAB no wheezing or rhonchi GI/GU:  non-distended, non-tender MSK/SPINE:  No gross deformities, no edema SKIN:  no rash, atraumatic   *Additional and/or pertinent findings included in MDM below  Diagnostic and Interventional Summary  EKG Interpretation  Date/Time:    Ventricular Rate:    PR Interval:    QRS Duration:   QT Interval:    QTC Calculation:   R Axis:     Text Interpretation:         Labs Reviewed  WET PREP, GENITAL - Abnormal; Notable for the following components:      Result Value   Trich, Wet Prep PRESENT (*)    Clue Cells Wet Prep HPF POC PRESENT (*)    WBC, Wet Prep HPF POC >=10 (*)    All other components within normal limits  CBC WITH DIFFERENTIAL/PLATELET - Abnormal; Notable for the following components:   RBC 3.86 (*)    Lymphs Abs 4.5 (*)    All other components within normal limits  COMPREHENSIVE METABOLIC PANEL - Abnormal; Notable for the following components:   Glucose, Bld 107 (*)    All other components within normal limits  URINALYSIS, ROUTINE W REFLEX  MICROSCOPIC - Abnormal; Notable for the following components:   Hgb urine dipstick MODERATE (*)    Protein, ur 30 (*)    Leukocytes,Ua SMALL (*)    All other components within normal limits  HCG, QUANTITATIVE, PREGNANCY  GC/CHLAMYDIA PROBE AMP ([place]) NOT AT [facility]  TROPONIN I (HIGH SENSITIVITY)    DG Chest Port 1 View  Final Result    CT ABDOMEN PELVIS W CONTRAST  Final Result      Medications  sodium chloride 0.9 % bolus 1,000 mL (1,000 mLs Intravenous New Bag/Given 12/15/22 2221)  iohexol (OMNIPAQUE) 300 MG/ML solution 100 mL (100 mLs Intravenous Contrast Given 12/16/22 0045)  cefTRIAXone (ROCEPHIN) injection 500 mg (500 mg Intramuscular Given 12/16/22 0245)  metroNIDAZOLE (FLAGYL) tablet 500 mg (500 mg Oral Given 12/16/22 0247)  doxycycline (VIBRA-TABS) tablet 100 mg (100 mg Oral Given 12/16/22 0247)  HYDROcodone-acetaminophen (NORCO/VICODIN) 5-325 MG per tablet 1 tablet (1 tablet Oral Given 12/16/22 0247)  alum & mag hydroxide-simeth (MAALOX/MYLANTA) 200-200-20 MG/5ML suspension 30 mL (30 mLs Oral Given 12/16/22 0245)  lidocaine (PF) (XYLOCAINE) 1 % injection 2 mL (1 mL Other Given 12/16/22 0245)     Procedures  /  Critical Care Procedures  ED Course and Medical Decision Making  Initial Impression and Ddx Vaginal bleeding after hysterectomy.  She is confident that it is not bleeding from the rectum.  She has been having bleeding with urinating and so UTI is also considered.  She denies any recent vaginal intercourse or trauma.  Malignancy is considered.  Will obtain CT imaging, pelvic exam to follow.  Past medical/surgical history that increases complexity of ED encounter: History of hysterectomy  Interpretation of Diagnostics I personally reviewed the EKG and my interpretation is as follows: Sinus rhythm without concerning ischemic features  Labs reassuring without significant blood count or electrolyte disturbance.  Patient Reassessment and Ultimate  Disposition/Management     CT imaging is normal.  Patient complaining of some burning central chest pain worse when laying flat, suspect GERD.  Troponin added on as his chest x-ray, EKG.  All reassuring.  Pelvic exam is without significant lymphadenopathy, there is white/yellow discharge in the vaginal vault.  Wet prep positive for trichomonas, clue cells.  Patient appropriate for discharge with antibiotic management, follow-up.  Patient management required discussion with the following services or consulting groups:  None  Complexity of Problems Addressed Acute illness or injury that poses threat of life of bodily function  Additional Data Reviewed and Analyzed Further history obtained from: Recent Consult notes  and Prior labs/imaging results  Additional Factors Impacting ED Encounter Risk Prescriptions  Elmer Sow. Pilar Plate, MD [facility] Emergency Medicine [facility] [email or web address]  Final Clinical Impressions(s) / ED Diagnoses     ICD-10-CM   1. Vaginal bleeding  N93.9     2. Trichomonas infection  A59.9     3. Chest pain, unspecified type  R07.9       ED Discharge Orders          Ordered    doxycycline (VIBRAMYCIN) 100 MG capsule  2 times daily        12/16/22 0335    metroNIDAZOLE (FLAGYL) 500 MG tablet  2 times daily        12/16/22 0335    omeprazole (PRILOSEC) 20 MG capsule  Daily        12/16/22 0335             Discharge Instructions Discussed with and Provided to Patient:     Discharge Instructions      You were evaluated in the Emergency Department and after careful evaluation, we did not find any emergent condition requiring admission or further testing in the hospital.  Your exam/testing today is overall reassuring.  You tested positive for an STD.  This may be causing your vaginal bleeding.  Take the doxycycline and metronidazole antibiotics as directed.  If you complete these medicines and you are still having bleeding would  recommend follow-up with a OB/GYN doctor.  For your chest pain, sounds like it is due to acid reflux.  Take the omeprazole daily to see if it helps.  Recommend close follow-up with your primary care doctor to discuss all of your symptoms.  Please return to the Emergency Department if you experience any worsening of your condition.   Thank you for allowing [place] to be a part of your care.       Sabas Sous, MD 12/16/22 [id number]

## 2022-12-17 LAB — GC/CHLAMYDIA PROBE AMP (~~LOC~~) NOT AT ARMC
Chlamydia: NEGATIVE
Comment: NEGATIVE
Comment: NORMAL
Neisseria Gonorrhea: POSITIVE — AB

## 2023-05-04 ENCOUNTER — Encounter: Payer: Self-pay | Admitting: Nurse Practitioner

## 2023-05-04 ENCOUNTER — Encounter: Payer: Self-pay | Admitting: *Deleted

## 2023-05-04 ENCOUNTER — Ambulatory Visit (HOSPITAL_COMMUNITY)
Admission: RE | Admit: 2023-05-04 | Discharge: 2023-05-04 | Disposition: A | Discharge status: Home / Self Care | Payer: Medicaid Other | Source: Ambulatory Visit | Attending: Nurse Practitioner | Admitting: Nurse Practitioner

## 2023-05-04 ENCOUNTER — Ambulatory Visit (INDEPENDENT_AMBULATORY_CARE_PROVIDER_SITE_OTHER): Payer: Medicaid Other | Admitting: Nurse Practitioner

## 2023-05-04 VITALS — BP 143/85 | HR 92 | Ht 60.0 in | Wt 197.0 lb

## 2023-05-04 DIAGNOSIS — K219 Gastro-esophageal reflux disease without esophagitis: Secondary | ICD-10-CM | POA: Diagnosis not present

## 2023-05-04 DIAGNOSIS — G4733 Obstructive sleep apnea (adult) (pediatric): Secondary | ICD-10-CM | POA: Diagnosis not present

## 2023-05-04 DIAGNOSIS — J4520 Mild intermittent asthma, uncomplicated: Secondary | ICD-10-CM | POA: Diagnosis not present

## 2023-05-04 DIAGNOSIS — J3089 Other allergic rhinitis: Secondary | ICD-10-CM

## 2023-05-04 DIAGNOSIS — Z01811 Encounter for preprocedural respiratory examination: Secondary | ICD-10-CM | POA: Insufficient documentation

## 2023-05-04 MED ORDER — OMEPRAZOLE 40 MG PO CPDR
40.0000 mg | DELAYED_RELEASE_CAPSULE | Freq: Every day | ORAL | 5 refills | Status: DC
Start: 2023-05-04 — End: 2023-11-04

## 2023-05-04 MED ORDER — LEVALBUTEROL TARTRATE 45 MCG/ACT IN AERO
2.0000 | INHALATION_SPRAY | RESPIRATORY_TRACT | 1 refills | Status: AC | PRN
Start: 2023-05-04 — End: ?

## 2023-05-04 NOTE — Assessment & Plan Note (Signed)
Mild OSA; intolerant of CPAP. She will utilize side lying sleeping position and work on healthy weight loss for management. Reassess with weight gain or symptom change, as needed. Aware of safe driving practices.

## 2023-05-04 NOTE — Patient Instructions (Signed)
Levalbuterol 2 puffs every 4-6 hours as needed for shortness of breath or wheezing. If you are having to use this multiple times a week, let me know so I can resume your daily inhaler with Symbicort or similar medicine Restart omeprazole 40 mg daily. Take 30 minutes before your first meal  You can use pepcid 20 mg over the counter as needed for breakthrough reflux symptoms  Labs today - IgE If your levels are still very high, I may start you on singulair nightly   Chest x ray for preoperative evaluation  Take your inhaler with you to surgery. Use incentive spirometer ten times an hour while awake during the recovery period. Out of bed as soon as possible after surgery  You have mild sleep apnea on your previous testing. We discussed how untreated sleep apnea puts an individual at risk for cardiac arrhthymias, pulm HTN, DM, stroke and increases their risk for daytime accidents; however, these are minimal with mild severity. We also briefly reviewed treatment options including weight loss, side sleeping position, oral appliance, CPAP therapy. Recommend you focus on side lying sleeping position and weight loss since you had difficulties with CPAP in the past. If you feel you develop worsening daytime sleepiness or poor sleep quality, we can reassess your sleep apnea with a repeat sleep study. Use caution when driving and pull over if you become sleepy  Follow up in 6 months with new pulmonologist in [place]. If symptoms do not improve or worsen, please contact office for sooner follow up or seek emergency care.

## 2023-05-04 NOTE — Assessment & Plan Note (Addendum)
Low risk. Factors that increase the risk for postoperative pulmonary complications are asthma, mild OSA, obesity. Preoperative CXR today  Respiratory complications generally occur in 1% of ASA Class I patients, 5% of ASA Class II and 10% of ASA Class III-IV patients These complications rarely result in mortality and include postoperative pneumonia, atelectasis, pulmonary embolism, ARDS and increased time requiring postoperative mechanical ventilation.   Overall, I recommend proceeding with the surgery if the risk for respiratory complications are outweighed by the potential benefits. This will need to be discussed between the patient and surgeon.   To reduce risks of respiratory complications, I recommend: --Pre- and post-operative incentive spirometry performed frequently while awake --Inpatient use of currently prescribed bronchodilators --Short duration of surgery as much as possible and avoid paralytic if possible --OOB, encourage mobility post-op   1) RISK FOR PROLONGED MECHANICAL VENTILAION - > 48h  1A) Arozullah - Prolonged mech ventilation risk Arozullah Postperative Pulmonary Risk Score - for mech ventilation dependence >48h USAA, Ann Surg 2000, major non-cardiac surgery) Comment Score  Type of surgery - abd ao aneurysm (27), thoracic (21), neurosurgery / upper abdominal / vascular (21), neck (11) Right hip arthroplasty  5  Emergency Surgery - (11)  0  ALbumin < 3 or poor nutritional state - (9)  0  BUN > 30 -  (8)  0  Partial or completely dependent functional status - (7)  0  COPD -  (6) asthma 6  Age - 60 to 69 (4), > 70  (6) 47 0  TOTAL  11  Risk Stratifcation scores  - < 10 (0.5%), 11-19 (1.8%), 20-27 (4.2%), 28-40 (10.1%), >40 (26.6%)  1.8%

## 2023-05-04 NOTE — Assessment & Plan Note (Signed)
Well controlled. No recent exacerbations. She is not on any controller regimen. Possible cat exposure was large contributing factor previously? She does have a positive allergen panel to cats, dust, cockroaches. She has an occasional wheeze that resolves without bronchodilator. Will refill her levalbuterol for rescue use. Understands to notify if she is using this routinely so we can resume maintenance inhaler. Action plan in place. Previously significantly elevated IgE - recheck today. If this is still elevated, may consider starting singulair for trigger prevention.   Patient Instructions  Levalbuterol 2 puffs every 4-6 hours as needed for shortness of breath or wheezing. If you are having to use this multiple times a week, let me know so I can resume your daily inhaler with Symbicort or similar medicine Restart omeprazole 40 mg daily. Take 30 minutes before your first meal  You can use pepcid 20 mg over the counter as needed for breakthrough reflux symptoms  Labs today - IgE If your levels are still very high, I may start you on singulair nightly   Chest x ray for preoperative evaluation  Take your inhaler with you to surgery. Use incentive spirometer ten times an hour while awake during the recovery period. Out of bed as soon as possible after surgery  You have mild sleep apnea on your previous testing. We discussed how untreated sleep apnea puts an individual at risk for cardiac arrhthymias, pulm HTN, DM, stroke and increases their risk for daytime accidents; however, these are minimal with mild severity. We also briefly reviewed treatment options including weight loss, side sleeping position, oral appliance, CPAP therapy. Recommend you focus on side lying sleeping position and weight loss since you had difficulties with CPAP in the past. If you feel you develop worsening daytime sleepiness or poor sleep quality, we can reassess your sleep apnea with a repeat sleep study. Use caution when driving  and pull over if you become sleepy  Follow up in 6 months with new pulmonologist in [place]. If symptoms do not improve or worsen, please contact office for sooner follow up or seek emergency care.

## 2023-05-04 NOTE — Progress Notes (Signed)
@Patient  ID: Erica [name], female    DOB: Apr 10, 1976, 47 y.o.   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  She is a former patient of Dr. Evlyn Courier and last seen in office 12/10/2021.  Past medical history significant for chronic PID, tubo-ovarian abscess, allergic rhinitis, GERD.  TEST/EVENTS:  07/01/2020 HRCT chest: Patchy mild/moderate groundglass opacities in peripheral upper lobes bilaterally, diffuse bronchial wall thickening. 07/01/2020 PSG: AHI 8.3, SpO2 low 82%.  REM AHI 30.2/h 03/21/2021 CT chest: Stable bilateral patchy opacities 04/22/2021 PFT: FVC 84, FEV1 89, ratio 90, TLC 81, DLCO corrected 86%.  No BD 10/17/2021 IgE 7995 12/08/2021 allergy test >> dust mites, cat, cockroach 12/16/2022 CXR: Mild cardiomegaly.  Lungs are clear and well aerated  12/10/2021: OV with Dr. Craige Cotta.  Persistent exacerbations of her asthma.  Previously had to be hospitalized for this saw Dr. Dellis Anes.  Found to have allergies to dust mites, cats and cockroaches.  Significant elevation in IgE and eosinophils.  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  Feels like current regimen of Symbicort, Xyzal and Xopenex works best for her.  Still has a dry cough and feels congested in her sinuses.  Has upcoming appointment with ENT.  Having some trouble with her asthma that she was not able to be consistent with CPAP.  Most recent chest x-rays have been cleared.  Aspergillus workup was negative.  She had negative ANCA, A1 AT and HP panel as well.  Advised to resume CPAP therapy now that her asthma is better controlled.  05/04/2023: Today - follow up Patient presents today  for follow up. She hasn't been seen by [place] or allergy in over a year. She's been out of her inhalers for about a year now. She has not had to have any steroids or antibiotics. She still has some wheezing from time to time. Resolves on it's own. She doesn't feel like her breathing limits her actitives. She does notice that she gets a little winded when she's working. 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 She's developed some issues with her right hip because of this, which she thinks the pain is primarily what impacts her breathing. She's seen someone at [facility] for this. She does need a hip replacement on the right side. She does not have a surgery date. Limited by financials right now. They also wanted her to have risk assessment prior to scheduling her procedure. She wants to have her levalbuterol refilled to have on hand. Not having any significant allergy symptoms. She feels relatively well controlled right now. She does not have any exposures to cats now, which seemed to be a contributing factor previously.   She's still having some reflux symptoms. She has a lot of belching and throat is burning. She's not taking anything right now. She denies any weight loss, difficulties swallowing, hoarse voice, changes in appetite.   She does have a history of mild sleep apnea. Wasn't able to tolerate the CPAP. She denies any issues with daytime sleepiness,  drowsy driving. No sleep parasomnias/paralysis.   Allergies  Allergen Reactions   Strawberry (Diagnostic) Swelling    Immunization History  Administered Date(s) Administered   Influenza,inj,Quad PF,6+ Mos 12/08/2019   Moderna Sars-Covid-2 Vaccination 06/05/2020, 08/29/2020   Pneumococcal Polysaccharide-23 02/04/2013    Past Medical History:  Diagnosis Date   Asthma    Eczema    Pelvic inflammatory disease    Tubo-ovarian abscess     Tobacco  History: Social History   Tobacco Use  Smoking Status Former   Current packs/day: 0.00   Types: Cigarettes   Start date: 11/25/2006   Quit date: 11/24/2021   Years since quitting: 1.4  Smokeless Tobacco Current  Tobacco Comments   Quit smoking 09/12/2021   Ready to quit: Not Answered Counseling given: Not Answered Tobacco comments: Quit smoking 09/12/2021   Outpatient Medications Prior to Visit  Medication Sig Dispense Refill   diclofenac (VOLTAREN) 75 MG EC tablet Take 75 mg by mouth 2 (two) times daily.     diltiazem (TIAZAC) 120 MG 24 hr capsule Take 120 mg by mouth daily.     meloxicam (MOBIC) 15 MG tablet Take 15 mg by mouth daily.     methocarbamol (ROBAXIN) 500 MG tablet Take 500 mg by mouth at bedtime as needed.     famotidine (PEPCID) 20 MG tablet One after supper (Patient not taking: Reported on 05/04/2023) 30 tablet 11   benzonatate (TESSALON) 200 MG capsule Take 1 capsule (200 mg total) by mouth 3 (three) times daily as needed for cough. 30 capsule 1   budesonide-formoterol (SYMBICORT) 160-4.5 MCG/ACT inhaler Inhale 2 puffs into the lungs 2 (two) times daily. With Spacer (Patient not taking: Reported on 05/04/2023) 10.2 g 5   diltiazem (CARDIZEM CD) 180 MG 24 hr capsule Take 180 mg by mouth daily.     fluticasone (FLONASE) 50 MCG/ACT nasal spray Place 1 spray into both nostrils daily. (Patient not taking: Reported on 05/04/2023) 16 g 2   levalbuterol (XOPENEX HFA) 45 MCG/ACT inhaler Inhale 2 puffs into the lungs every 4 (four) hours as needed for wheezing. (Patient not taking: Reported on 05/04/2023) 1 each 1   levalbuterol (XOPENEX) 0.63 MG/3ML nebulizer solution Take 3 mLs (0.63 mg total) by nebulization every 6 (six) hours as needed for wheezing or shortness of breath. (Patient not taking: Reported on 05/04/2023) 360 mL 1   levocetirizine (XYZAL) 5 MG tablet Take 1 tablet (5 mg total) by mouth 2 (two) times daily as needed. (Patient not taking: Reported on 05/04/2023) 60  tablet 5   omeprazole (PRILOSEC) 20 MG capsule Take 1 capsule (20 mg total) by mouth daily. (Patient not taking: Reported on 05/04/2023) 30 capsule 1   No facility-administered medications prior to visit.     Review of Systems:   Constitutional: No weight loss or gain, night sweats, fevers, chills, fatigue, or lassitude. HEENT: No headaches, difficulty swallowing, tooth/dental problems, or sore throat. No sneezing, itching, ear ache, nasal congestion, or post nasal drip CV:  No chest pain, orthopnea, PND, swelling in lower extremities, anasarca, dizziness, palpitations, syncope Resp: +occasional wheeze; minimal shortness of breath with exertion. No excess mucus or change in color of mucus. No productive or non-productive. No hemoptysis. No chest wall deformity GI:  + heartburn, indigestion. No abdominal pain, nausea, vomiting, diarrhea, change in bowel habits, loss of appetite, bloody stools.  GU: No dysuria, change in color of urine, urgency or frequency. Skin: No rash, lesions, ulcerations MSK:  +right hip pain Neuro: No dizziness or  lightheadedness.  Psych: No depression or anxiety. Mood stable.     Physical Exam:  BP (!) 143/85   Pulse 92   Ht 5' (1.524 m)   Wt 197 lb (89.4 kg)   LMP 06/17/2013   SpO2 94%   BMI 38.47 kg/m   GEN: Pleasant, interactive, well-appearing; obese; in no acute distress HEENT:  Normocephalic and atraumatic. PERRLA. Sclera white. Nasal turbinates pink, moist and patent bilaterally. No rhinorrhea present. Oropharynx pink and moist, without exudate or edema. No lesions, ulcerations, or postnasal drip.  NECK:  Supple w/ fair ROM. No JVD present. Normal carotid impulses w/o bruits. Thyroid symmetrical with no goiter or nodules palpated. No lymphadenopathy.   CV: RRR, no m/r/g, no peripheral edema. Pulses intact, +2 bilaterally. No cyanosis, pallor or clubbing. PULMONARY:  Unlabored, regular breathing. Clear bilaterally A&P w/o wheezes/rales/rhonchi. No  accessory muscle use.  GI: BS present and normoactive. Soft, non-tender to palpation. No organomegaly or masses detected.  MSK: No erythema, warmth. Cap refil <2 sec all extrem.  Neuro: A/Ox3. No focal deficits noted.   Skin: Warm, no lesions or rashe Psych: Normal affect and behavior. Judgement and thought content appropriate.     Lab Results:  CBC    Component Value Date/Time   WBC 9.0 12/15/2022 2212   RBC 3.86 (L) 12/15/2022 2212   HGB 12.2 12/15/2022 2212   HGB 12.6 10/17/2021 1632   HCT 37.9 12/15/2022 2212   HCT 38.0 10/17/2021 1632   PLT 291 12/15/2022 2212   PLT 361 10/17/2021 1632   MCV 98.2 12/15/2022 2212   MCV 88 10/17/2021 1632   MCH 31.6 12/15/2022 2212   MCHC 32.2 12/15/2022 2212   RDW 13.6 12/15/2022 2212   RDW 14.4 10/17/2021 1632   LYMPHSABS 4.5 (H) 12/15/2022 2212   LYMPHSABS 3.6 (H) 10/17/2021 1632   MONOABS 0.4 12/15/2022 2212   EOSABS 0.2 12/15/2022 2212   EOSABS 0.5 (H) 10/17/2021 1632   BASOSABS 0.0 12/15/2022 2212   BASOSABS 0.0 10/17/2021 1632    BMET    Component Value Date/Time   NA 140 12/15/2022 2212   K 3.5 12/15/2022 2212   CL 105 12/15/2022 2212   CO2 26 12/15/2022 2212   GLUCOSE 107 (H) 12/15/2022 2212   BUN 18 12/15/2022 2212   CREATININE 0.83 12/15/2022 2212   CALCIUM 9.0 12/15/2022 2212   GFRNONAA >60 12/15/2022 2212   GFRAA >90 04/21/2013 0625    BNP    Component Value Date/Time   BNP 20.0 09/10/2021 0734     Imaging:  No results found.  Administration History     None          Latest Ref Rng & Units 04/22/2021   10:51 AM  PFT Results  FVC-Predicted Pre % 84   FVC-Post L 2.17   FVC-Predicted Post % 81   Pre FEV1/FVC % % 83   Post FEV1/FCV % % 90   FEV1-Pre L 1.85   FEV1-Predicted Pre % 85   FEV1-Post L 1.95   DLCO uncorrected ml/min/mmHg 16.26   DLCO UNC% % 84   DLCO corrected ml/min/mmHg 16.63   DLCO COR %Predicted % 86   DLVA Predicted % 113   TLC L 3.71   TLC % Predicted % 81   RV %  Predicted % 98     No results found for: "NITRICOXIDE"      Assessment & Plan:   Mild intermittent allergic asthma without complication Well controlled. No recent exacerbations. She is  not on any controller regimen. Possible cat exposure was large contributing factor previously? She does have a positive allergen panel to cats, dust, cockroaches. She has an occasional wheeze that resolves without bronchodilator. Will refill her levalbuterol for rescue use. Understands to notify if she is using this routinely so we can resume maintenance inhaler. Action plan in place. Previously significantly elevated IgE - recheck today. If this is still elevated, may consider starting singulair for trigger prevention.   Patient Instructions  Levalbuterol 2 puffs every 4-6 hours as needed for shortness of breath or wheezing. If you are having to use this multiple times a week, let me know so I can resume your daily inhaler with Symbicort or similar medicine Restart omeprazole 40 mg daily. Take 30 minutes before your first meal  You can use pepcid 20 mg over the counter as needed for breakthrough reflux symptoms  Labs today - IgE If your levels are still very high, I may start you on singulair nightly   Chest x ray for preoperative evaluation  Take your inhaler with you to surgery. Use incentive spirometer ten times an hour while awake during the recovery period. Out of bed as soon as possible after surgery  You have mild sleep apnea on your previous testing. We discussed how untreated sleep apnea puts an individual at risk for cardiac arrhthymias, pulm HTN, DM, stroke and increases their risk for daytime accidents; however, these are minimal with mild severity. We also briefly reviewed treatment options including weight loss, side sleeping position, oral appliance, CPAP therapy. Recommend you focus on side lying sleeping position and weight loss since you had difficulties with CPAP in the past. If you feel  you develop worsening daytime sleepiness or poor sleep quality, we can reassess your sleep apnea with a repeat sleep study. Use caution when driving and pull over if you become sleepy  Follow up in 6 months with new pulmonologist in [place]. If symptoms do not improve or worsen, please contact office for sooner follow up or seek emergency care.     GERD (gastroesophageal reflux disease) GERD symptoms. Discussed correlation between reflux and asthma. We will start her on daily PPI therapy with omeprazole. Side effect profile reviewed. GERD precautions reviewed.   OSA (obstructive sleep apnea) Mild OSA; intolerant of CPAP. She will utilize side lying sleeping position and work on healthy weight loss for management. Reassess with weight gain or symptom change, as needed. Aware of safe driving practices.   Preoperative respiratory examination Low risk. Factors that increase the risk for postoperative pulmonary complications are asthma, mild OSA, obesity. Preoperative CXR today  Respiratory complications generally occur in 1% of ASA Class I patients, 5% of ASA Class II and 10% of ASA Class III-IV patients These complications rarely result in mortality and include postoperative pneumonia, atelectasis, pulmonary embolism, ARDS and increased time requiring postoperative mechanical ventilation.   Overall, I recommend proceeding with the surgery if the risk for respiratory complications are outweighed by the potential benefits. This will need to be discussed between the patient and surgeon.   To reduce risks of respiratory complications, I recommend: --Pre- and post-operative incentive spirometry performed frequently while awake --Inpatient use of currently prescribed bronchodilators --Short duration of surgery as much as possible and avoid paralytic if possible --OOB, encourage mobility post-op   1) RISK FOR PROLONGED MECHANICAL VENTILAION - > 48h  1A) Arozullah - Prolonged mech ventilation  risk Arozullah Postperative Pulmonary Risk Score - for mech ventilation dependence >48h [employer]  Surg 2000, major non-cardiac surgery) Comment Score  Type of surgery - abd ao aneurysm (27), thoracic (21), neurosurgery / upper abdominal / vascular (21), neck (11) Right hip arthroplasty  5  Emergency Surgery - (11)  0  ALbumin < 3 or poor nutritional state - (9)  0  BUN > 30 -  (8)  0  Partial or completely dependent functional status - (7)  0  COPD -  (6) asthma 6  Age - 60 to 36 (4), > 70  (6) 45 0  TOTAL  11  Risk Stratifcation scores  - < 10 (0.5%), 11-19 (1.8%), 20-27 (4.2%), 28-40 (10.1%), >40 (26.6%)  1.8%     I spent 38 minutes of dedicated to the care of this patient on the date of this encounter to include pre-visit review of records, face-to-face time with the patient discussing conditions above, post visit ordering of testing, clinical documentation with the electronic health record, making appropriate referrals as documented, and communicating necessary findings to members of the patients care team.  Noemi Chapel, NP 05/04/2023  Pt aware and understands NP's role.

## 2023-05-04 NOTE — Assessment & Plan Note (Signed)
GERD symptoms. Discussed correlation between reflux and asthma. We will start her on daily PPI therapy with omeprazole. Side effect profile reviewed. GERD precautions reviewed.

## 2023-05-08 LAB — IGE: IgE (Immunoglobulin E), Serum: 4717 [IU]/mL — ABNORMAL HIGH (ref 6–495)

## 2023-05-11 ENCOUNTER — Telehealth: Payer: Self-pay | Admitting: Adult Health

## 2023-05-11 NOTE — Telephone Encounter (Signed)
Lease call PT w/BW and Rad results. [phone]

## 2023-05-18 MED ORDER — MONTELUKAST SODIUM 10 MG PO TABS: 10.0000 mg | ORAL_TABLET | Freq: Every day | ORAL | 11 refills | Status: DC

## 2023-05-18 NOTE — Addendum Note (Signed)
Addended by: Glynda Jaeger on: 05/18/2023 09:01 AM   Modules accepted: Orders

## 2023-05-21 NOTE — Telephone Encounter (Signed)
Called radiology and they did not have her cxr read. Closing encounter

## 2023-05-31 ENCOUNTER — Ambulatory Visit: Payer: Medicaid Other | Admitting: Orthopaedic Surgery

## 2023-06-09 ENCOUNTER — Telehealth: Payer: Self-pay | Admitting: Nurse Practitioner

## 2023-06-09 NOTE — Telephone Encounter (Signed)
Erica [name] states did not receive the entire fax of office notes 05/04/2023. Fax number is [phone]. Phone number is [phone].

## 2023-06-09 NOTE — Telephone Encounter (Signed)
There is no documentation in chart about sending OV notes. Unsure what the message is referring to.

## 2023-06-28 ENCOUNTER — Institutional Professional Consult (permissible substitution): Payer: Medicaid Other | Admitting: Adult Health

## 2023-08-25 ENCOUNTER — Encounter: Payer: Medicaid Other | Admitting: Bariatrics

## 2023-09-01 DIAGNOSIS — Z0289 Encounter for other administrative examinations: Secondary | ICD-10-CM

## 2023-09-02 ENCOUNTER — Ambulatory Visit: Payer: Medicaid Other | Admitting: Bariatrics

## 2023-09-02 ENCOUNTER — Encounter: Payer: Self-pay | Admitting: Bariatrics

## 2023-09-02 VITALS — BP 133/83 | HR 79 | Temp 98.6°F | Ht 60.0 in | Wt 202.0 lb

## 2023-09-02 DIAGNOSIS — K219 Gastro-esophageal reflux disease without esophagitis: Secondary | ICD-10-CM | POA: Diagnosis not present

## 2023-09-02 DIAGNOSIS — Z6839 Body mass index (BMI) 39.0-39.9, adult: Secondary | ICD-10-CM | POA: Diagnosis not present

## 2023-09-02 DIAGNOSIS — E66812 Obesity, class 2: Secondary | ICD-10-CM

## 2023-09-02 DIAGNOSIS — E669 Obesity, unspecified: Secondary | ICD-10-CM | POA: Diagnosis not present

## 2023-09-02 DIAGNOSIS — I10 Essential (primary) hypertension: Secondary | ICD-10-CM

## 2023-09-02 NOTE — Progress Notes (Signed)
 Office: [phone]  /  Fax: [phone]   Initial Visit  Erica [name] was seen in clinic today to evaluate for obesity. She is interested in losing weight to improve overall health and reduce the risk of weight related complications. She presents today to review program treatment options, initial physical assessment, and evaluation.     She was referred by: PCP  When asked what else they would like to accomplish? She states: Adopt healthier eating patterns, Improve energy levels and physical activity, and Improve existing medical conditions  When asked how has your weight affected you? She states: Contributed to orthopedic problems or mobility issues, Having fatigue, and Having poor endurance  Some associated conditions: Hypertension and GERD  Contributing factors: Family history of obesity and Slow metabolism for age  Weight promoting medications identified: None  Current nutrition plan: Other: Slim Fast  Current level of physical activity: Walking some at work  Current or previous pharmacotherapy: GLP-1, Semaglutide  Response to medication: Ineffective so it was discontinued   Past medical history includes:   Past Medical History:  Diagnosis Date   Asthma    Eczema    Pelvic inflammatory disease    Tubo-ovarian abscess      Objective:   BP 133/83   Pulse 79   Temp 98.6 F (37 C)   Ht 5' (1.524 m)   Wt 202 lb (91.6 kg)   LMP 06/17/2013   SpO2 98%   BMI 39.45 kg/m  She was weighed on the bioimpedance scale: Body mass index is 39.45 kg/m.  Peak Weight:202 lbs , Body Fat%:48.1, Visceral Fat Rating:15, Weight trend over the last 12 months: Increasing  General:  Alert, oriented and cooperative. Patient is in no acute distress.  Respiratory: Normal respiratory effort, no problems with respiration noted  Extremities: Normal range of motion.    Mental Status: Normal mood and affect. Normal behavior. Normal judgment and thought content.   DIAGNOSTIC DATA  REVIEWED:  BMET    Component Value Date/Time   NA 140 12/15/2022 2212   K 3.5 12/15/2022 2212   CL 105 12/15/2022 2212   CO2 26 12/15/2022 2212   GLUCOSE 107 (H) 12/15/2022 2212   BUN 18 12/15/2022 2212   CREATININE 0.83 12/15/2022 2212   CALCIUM 9.0 12/15/2022 2212   GFRNONAA >60 12/15/2022 2212   GFRAA >90 04/21/2013 0625   No results found for: "HGBA1C" No results found for: "INSULIN" CBC    Component Value Date/Time   WBC 9.0 12/15/2022 2212   RBC 3.86 (L) 12/15/2022 2212   HGB 12.2 12/15/2022 2212   HGB 12.6 10/17/2021 1632   HCT 37.9 12/15/2022 2212   HCT 38.0 10/17/2021 1632   PLT 291 12/15/2022 2212   PLT 361 10/17/2021 1632   MCV 98.2 12/15/2022 2212   MCV 88 10/17/2021 1632   MCH 31.6 12/15/2022 2212   MCHC 32.2 12/15/2022 2212   RDW 13.6 12/15/2022 2212   RDW 14.4 10/17/2021 1632   Iron/TIBC/Ferritin/ %Sat No results found for: "IRON", "TIBC", "FERRITIN", "IRONPCTSAT" Lipid Panel  No results found for: "CHOL", "TRIG", "HDL", "CHOLHDL", "VLDL", "LDLCALC", "LDLDIRECT" Hepatic Function Panel     Component Value Date/Time   PROT 7.3 12/15/2022 2212   ALBUMIN 3.8 12/15/2022 2212   AST 15 12/15/2022 2212   ALT 16 12/15/2022 2212   ALKPHOS 81 12/15/2022 2212   BILITOT 0.4 12/15/2022 2212   No results found for: "TSH"   Assessment and Plan:   Patient states that she needs the following  in a plan: A good eating plan, strategies to stop skipping meals, and information on meal preparation.  Hypertension Hypertension control uncertain.  Medication(s): Tiazac 120 mg daily   BP Readings from Last 3 Encounters:  09/02/23 133/83  05/04/23 (!) 143/85  12/16/22 (!) 144/96   Lab Results  Component Value Date   CREATININE 0.83 12/15/2022   CREATININE 0.72 09/10/2021   CREATININE 0.70 08/11/2015   No results found for: "GFR"  Plan: Continue all antihypertensives at current dosages. No added salt. Will keep sodium content to 1,500 mg or less per day.     Gastroesophageal reflux disease without esophagitis Symptoms is well-controlled. Medication: Prilosec 40 mg  Plan: Continue PPI. Avoid trigger foods (spicy foods, fatty/fried foods, acidic foods, coffee, alcohol). Avoid eating within 3 hours of bedtime. Continue to work on weight loss.    Generalized Obesity: Current BMI 39.45    Obesity Treatment / Action Plan:  Patient will work on garnering support from family and friends to begin weight loss journey. Will work on eliminating or reducing the presence of highly palatable, calorie dense foods in the home. Will complete provided nutritional and psychosocial assessment questionnaire before the next appointment. Will be scheduled for indirect calorimetry to determine resting energy expenditure in a fasting state.  This will allow [place] to create a reduced calorie, high-protein meal plan to promote loss of fat mass while preserving muscle mass. Counseled on the health benefits of losing 5%-15% of total body weight. Was counseled on nutritional approaches to weight loss and benefits of reducing processed foods and consuming plant-based foods and high quality protein as part of nutritional weight management. Was counseled on pharmacotherapy and role as an adjunct in weight management.   Obesity Education Performed Today:  She was weighed on the bioimpedance scale and results were discussed and documented in the synopsis.  We discussed obesity as a disease and the importance of a more detailed evaluation of all the factors contributing to the disease.  We discussed the importance of long term lifestyle changes which include nutrition, exercise and behavioral modifications as well as the importance of customizing this to her specific health and social needs.  We discussed the benefits of reaching a healthier weight to alleviate the symptoms of existing conditions and reduce the risks of the biomechanical, metabolic and psychological effects  of obesity.  Discussed New Patient/Late Arrival, and Cancellation Policies. Patient voiced understanding and allowed to ask questions.   Erica [name] appears to be in the action stage of change and states they are ready to start intensive lifestyle modifications and behavioral modifications.  32 minutes was spent today on this visit including the above counseling, pre-visit chart review, and post-visit documentation.  Reviewed by clinician on day of visit: allergies, medications, problem list, medical history, surgical history, family history, social history, and previous encounter notes.    Olita Takeshita A. Lorretta HarpO.

## 2023-09-13 ENCOUNTER — Ambulatory Visit (INDEPENDENT_AMBULATORY_CARE_PROVIDER_SITE_OTHER): Payer: Medicaid Other | Admitting: Bariatrics

## 2023-09-13 ENCOUNTER — Encounter: Payer: Self-pay | Admitting: Bariatrics

## 2023-09-13 VITALS — BP 139/86 | HR 68 | Temp 97.9°F | Ht 60.5 in | Wt 201.0 lb

## 2023-09-13 DIAGNOSIS — R0602 Shortness of breath: Secondary | ICD-10-CM | POA: Diagnosis not present

## 2023-09-13 DIAGNOSIS — E66812 Obesity, class 2: Secondary | ICD-10-CM

## 2023-09-13 DIAGNOSIS — E669 Obesity, unspecified: Secondary | ICD-10-CM | POA: Diagnosis not present

## 2023-09-13 DIAGNOSIS — E559 Vitamin D deficiency, unspecified: Secondary | ICD-10-CM

## 2023-09-13 DIAGNOSIS — I10 Essential (primary) hypertension: Secondary | ICD-10-CM

## 2023-09-13 DIAGNOSIS — Z1331 Encounter for screening for depression: Secondary | ICD-10-CM

## 2023-09-13 DIAGNOSIS — K219 Gastro-esophageal reflux disease without esophagitis: Secondary | ICD-10-CM

## 2023-09-13 DIAGNOSIS — Z0000 Encounter for general adult medical examination without abnormal findings: Secondary | ICD-10-CM

## 2023-09-13 DIAGNOSIS — R5383 Other fatigue: Secondary | ICD-10-CM | POA: Diagnosis not present

## 2023-09-13 DIAGNOSIS — Z6838 Body mass index (BMI) 38.0-38.9, adult: Secondary | ICD-10-CM

## 2023-09-13 NOTE — Progress Notes (Addendum)
 At a Glance:  Vitals Temp: 97.9 F (36.6 C) BP: 139/86 Pulse Rate: 68 SpO2: 99 %   Anthropometric Measurements Height: 5' 0.5" (1.537 m) Weight: 201 lb (91.2 kg) BMI (Calculated): 38.59 Starting Weight: 201 lb Peak Weight: 202 lb   Body Composition  Body Fat %: 47.5 % Fat Mass (lbs): 95.6 lbs Muscle Mass (lbs): 100.2 lbs Total Body Water (lbs): 74.4 lbs Visceral Fat Rating : 13   Other Clinical Data RMR: 1757 Fasting: yes Labs: yes Today's Visit #: 1 Starting Date: 09/13/23    EKG: Normal sinus rhythm, rate 70 bpm.  Indirect Calorimeter:   Resting Metabolic Rate ( RMR):  RMR (actual): 1757 kcal RMR (calculated): 1508 kcal The calculated basal metabolic rate is 8295 thus her basal metabolic rate is better than expected.  Plan:   Indirect calorimeter completed, interpreted and reviewed with patient today and allowed to ask questions.  Discussed the implications for the chosen plan and exercise based on the RMR reading.  Will consider repeating the RMR in the future based on weight loss.    Chief Complaint:  Obesity   Subjective:  Erica [name] (MR# [id number]) is a 48 y.o. female who presents for evaluation and treatment of obesity and related comorbidities.   Erica [name] is currently in the action stage of change and ready to dedicate time achieving and maintaining a healthier weight. Erica [name] is interested in becoming our patient and working on intensive lifestyle modifications including (but not limited to) diet and exercise for weight loss.  Erica [name] has been struggling with her weight. She has been unsuccessful in either losing weight, maintaining weight loss, or reaching her healthy weight goal.  Erica [name]'s habits were reviewed today and are as follows: she thinks her family will eat healthier with her, she has been heavy most of her life, she started gaining weight in her teens, she has significant food cravings issues, she snacks frequently  in the evenings, she skips meals frequently, and she frequently makes poor food choices. She has dealt with weight issues for most of her life.   Current or previous pharmacotherapy: GLP-1 and Phentermine  Response to medication: Ineffective so it was discontinued (compounding pharmacy).   Other Fatigue Erica [name] admits to daytime somnolence and admits to waking up still tired. Patient has a history of symptoms of daytime fatigue and morning fatigue. Erica [name] generally gets 5 or 6 hours of sleep per night, and states that she has nightime awakenings. Snoring is present. Apneic episodes are not present. Epworth Sleepiness Score is 9.   Shortness of Breath Sai notes increasing shortness of breath with exercising and seems to be worsening over time with weight gain. She notes getting out of breath sooner with activity than she used to. This has not gotten worse recently. Erica [name] denies shortness of breath at rest or orthopnea.  Depression Screen Erica [name]'s Food and Mood (modified PHQ-9) score was 16. 15-19 moderate severe depression     09/13/2023    8:08 AM  Depression screen PHQ 2/9  Decreased Interest 3  Down, Depressed, Hopeless 2  PHQ - 2 Score 5  Altered sleeping 2  Tired, decreased energy 3  Change in appetite 3  Feeling bad or failure about yourself  2  Trouble concentrating 1  Moving slowly or fidgety/restless 0  Suicidal thoughts 0  PHQ-9 Score 16  Difficult doing work/chores Very difficult     Assessment and Plan:   Other Fatigue [name] does feel that her weight is causing her energy to  be lower than it should be. Fatigue may be related to obesity, depression or many other causes. Labs will be ordered, and in the meanwhile, Erica [name] will focus on self care including making healthy food choices, increasing physical activity and focusing on stress reduction.  Shortness of Breath Erica [name] does feel that she gets out of breath more easily that she used to  when she exercises. Erica [name]'s shortness of breath appears to be obesity related and exercise induced. She has agreed to work on weight loss and gradually increase exercise to treat her exercise induced shortness of breath. Will continue to monitor closely.  Health Maintenance:   Obesity   Plan: Will do EKG, indirect calorimetry, and labs.     Vitamin D Deficiency She is at risk for vitamin D deficiency due to obesity.  She is on no vitamin D.    Plan: Will check for vitamin D deficiency.   GERD:   She has a history of GERD. She is taking Pepcid/ Prilosec as tolerated.   Plan:  She will continue medications.   Hypertension Hypertension stable.  Medication(s): Diltiazem (Cardia XT) 120 mg 1 daily   BP Readings from Last 3 Encounters:  09/13/23 139/86  09/02/23 133/83  05/04/23 (!) 143/85   Lab Results  Component Value Date   CREATININE 0.83 12/15/2022   CREATININE 0.72 09/10/2021   CREATININE 0.70 08/11/2015    Plan: Continue all antihypertensives at current dosages. No added salt. Will keep sodium content to 1,500 mg or less per day.     Erica [name] had a positive depression screening. Depression is commonly associated with obesity and often results in emotional eating behaviors. We will monitor this closely and work on CBT to help improve the non-hunger eating patterns. Referral to Psychology may be required if no improvement is seen as she continues in our clinic.    Previous labs reviewed today.  No recent labs.   Labs done today CMP, Lipids, Insulin, HgbA1c, and Vit D   Generalized Obesity: BMI (Calculated): 38.59   Erica [name] is currently in the action stage of change and her goal is to begin weight loss efforts. I recommend Erica [name] begin the structured treatment plan as follows:  She has agreed to Category 2 Plan  Exercise goals: All adults should avoid inactivity. Some activity is better than none, and adults who participate in any amount of physical  activity, gain some health benefits.  Behavioral modification strategies:increasing lean protein intake, increasing vegetables, increase H2O intake, no skipping meals, meal planning and cooking strategies, keeping healthy foods in the home, and better snacking choices  She was informed of the importance of frequent follow-up visits to maximize her success with intensive lifestyle modifications for her multiple health conditions. She was informed we would discuss her lab results at her next visit unless there is a critical issue that needs to be addressed sooner. Erica [name] agreed to keep her next visit at the agreed upon time to discuss these results.  Objective:  General: Cooperative, alert, well developed, in no acute distress. HEENT: Conjunctivae and lids unremarkable. Cardiovascular: Regular rhythm.  Lungs: Normal work of breathing. Neurologic: No focal deficits.   Lab Results  Component Value Date   CREATININE 0.83 12/15/2022   BUN 18 12/15/2022   NA 140 12/15/2022   K 3.5 12/15/2022   CL 105 12/15/2022   CO2 26 12/15/2022   Lab Results  Component Value Date   ALT 16 12/15/2022   AST 15 12/15/2022   ALKPHOS 81 12/15/2022  BILITOT 0.4 12/15/2022   No results found for: "HGBA1C" No results found for: "INSULIN" No results found for: "TSH" No results found for: "CHOL", "HDL", "LDLCALC", "LDLDIRECT", "TRIG", "CHOLHDL" Lab Results  Component Value Date   WBC 9.0 12/15/2022   HGB 12.2 12/15/2022   HCT 37.9 12/15/2022   MCV 98.2 12/15/2022   PLT 291 12/15/2022   No results found for: "IRON", "TIBC", "FERRITIN"  Attestation Statements:  Applicable history such as the following:  allergies, medications, problem list, medical history, surgical history, family history, social history, and previous encounter notes reviewed by clinician on day of visit:  Time spent on visit including the items listed below was 40 minutes.  -preparing to see the patient (e.g., review of tests,  history, previous notes) -obtaining and/or reviewing separately obtained history -counseling and educating the patient/family/caregiver -documenting clinical information in the electronic or other health record -ordering medications, tests, or procedures -independently interpreting results and communicating results to the patient/ family/caregiver -referring and communicating with other health care professionals  -care coordination   This may have been prepared with the assistance of Engineer, civil (consulting).  Occasional wrong-word or sound-a-like substitutions may have occurred due to the inherent limitations of voice recognition software.    Corinna Capra, DO

## 2023-09-14 ENCOUNTER — Encounter: Payer: Self-pay | Admitting: Bariatrics

## 2023-09-14 DIAGNOSIS — R7303 Prediabetes: Secondary | ICD-10-CM | POA: Insufficient documentation

## 2023-09-14 DIAGNOSIS — E559 Vitamin D deficiency, unspecified: Secondary | ICD-10-CM | POA: Insufficient documentation

## 2023-09-14 DIAGNOSIS — E7800 Pure hypercholesterolemia, unspecified: Secondary | ICD-10-CM | POA: Insufficient documentation

## 2023-09-14 LAB — COMPREHENSIVE METABOLIC PANEL
ALT: 12 IU/L (ref 0–32)
AST: 16 IU/L (ref 0–40)
Albumin: 4.4 g/dL (ref 3.9–4.9)
Alkaline Phosphatase: 130 IU/L — ABNORMAL HIGH (ref 44–121)
BUN/Creatinine Ratio: 13 (ref 9–23)
BUN: 8 mg/dL (ref 6–24)
Bilirubin Total: 0.4 mg/dL (ref 0.0–1.2)
CO2: 20 mmol/L (ref 20–29)
Calcium: 9.5 mg/dL (ref 8.7–10.2)
Chloride: 105 mmol/L (ref 96–106)
Creatinine, Ser: 0.64 mg/dL (ref 0.57–1.00)
Globulin, Total: 2.7 g/dL (ref 1.5–4.5)
Glucose: 106 mg/dL — ABNORMAL HIGH (ref 70–99)
Potassium: 3.6 mmol/L (ref 3.5–5.2)
Sodium: 140 mmol/L (ref 134–144)
Total Protein: 7.1 g/dL (ref 6.0–8.5)
eGFR: 111 mL/min/{1.73_m2} (ref >59)

## 2023-09-14 LAB — LIPID PANEL WITH LDL/HDL RATIO
Cholesterol, Total: 216 mg/dL — ABNORMAL HIGH (ref 100–199)
HDL: 62 mg/dL (ref >39)
LDL Chol Calc (NIH): 136 mg/dL — ABNORMAL HIGH (ref 0–99)
LDL/HDL Ratio: 2.2 ratio (ref 0.0–3.2)
Triglycerides: 101 mg/dL (ref 0–149)
VLDL Cholesterol Cal: 18 mg/dL (ref 5–40)

## 2023-09-14 LAB — TSH+T4F+T3FREE
Free T4: 1.1 ng/dL (ref 0.82–1.77)
T3, Free: 3.1 pg/mL (ref 2.0–4.4)
TSH: 0.738 u[IU]/mL (ref 0.450–4.500)

## 2023-09-14 LAB — VITAMIN D 25 HYDROXY (VIT D DEFICIENCY, FRACTURES): Vit D, 25-Hydroxy: 8.1 ng/mL — ABNORMAL LOW (ref 30.0–100.0)

## 2023-09-14 LAB — HEMOGLOBIN A1C
Est. average glucose Bld gHb Est-mCnc: 134 mg/dL
Hgb A1c MFr Bld: 6.3 % — ABNORMAL HIGH (ref 4.8–5.6)

## 2023-09-14 LAB — INSULIN, RANDOM: INSULIN: 19.1 u[IU]/mL (ref 2.6–24.9)

## 2023-09-27 ENCOUNTER — Telehealth: Payer: Self-pay | Admitting: *Deleted

## 2023-09-27 ENCOUNTER — Ambulatory Visit: Payer: Medicaid Other | Admitting: Bariatrics

## 2023-09-27 ENCOUNTER — Encounter: Payer: Self-pay | Admitting: Bariatrics

## 2023-09-27 VITALS — BP 134/94 | HR 88 | Temp 97.7°F | Ht 60.5 in | Wt 194.0 lb

## 2023-09-27 DIAGNOSIS — R7303 Prediabetes: Secondary | ICD-10-CM | POA: Diagnosis not present

## 2023-09-27 DIAGNOSIS — E559 Vitamin D deficiency, unspecified: Secondary | ICD-10-CM

## 2023-09-27 DIAGNOSIS — Z6837 Body mass index (BMI) 37.0-37.9, adult: Secondary | ICD-10-CM

## 2023-09-27 DIAGNOSIS — R632 Polyphagia: Secondary | ICD-10-CM

## 2023-09-27 MED ORDER — WEGOVY 0.25 MG/0.5ML ~~LOC~~ SOAJ: 0.2500 mg | SUBCUTANEOUS | 0 refills | Status: DC

## 2023-09-27 MED ORDER — VITAMIN D (ERGOCALCIFEROL) 1.25 MG (50000 UNIT) PO CAPS: 50000.0000 [IU] | ORAL_CAPSULE | ORAL | 0 refills | Status: DC

## 2023-09-27 NOTE — Telephone Encounter (Signed)
 Prior authorization done via cover my meds for patients. Wegovy. Waiting on determination.

## 2023-09-27 NOTE — Progress Notes (Signed)
 First follow-up after initial visit.        WEIGHT SUMMARY AND BIOMETRICS  Weight Lost Since Last Visit: 7lb  Weight Gained Since Last Visit: 0   Vitals Temp: 97.7 F (36.5 C) BP: (!) 134/94 Pulse Rate: 88 SpO2: 100 %   Anthropometric Measurements Height: 5' 0.5" (1.537 m) Weight: 194 lb (88 kg) BMI (Calculated): 37.25 Weight at Last Visit: 202lb Weight Lost Since Last Visit: 7lb Weight Gained Since Last Visit: 0 Starting Weight: 201lb Total Weight Loss (lbs): 7 lb (3.175 kg) Peak Weight: 202lb   Body Composition  Body Fat %: 45.8 % Fat Mass (lbs): 89.2 lbs Muscle Mass (lbs): 100.2 lbs Total Body Water (lbs): 72.2 lbs Visceral Fat Rating : 12   Other Clinical Data Fasting: no Labs: no Today's Visit #: 2 Starting Date: 09/13/23    OBESITY Erica [name] is here to discuss her progress with her obesity treatment plan along with follow-up of her obesity related diagnoses.    Nutrition Plan: the Category 2 plan - 80% adherence.  Current exercise: none  Interim History:  She is down 7 lbs since her last visit.  Eating all of the food on the plan., Protein intake is as prescribed, Is not skipping meals, Water intake is adequate., and Reports excessive cravings.  Initial positives regarding the dietary plan:  Initial challenges regarding  the dietary plan:   Pharmacotherapy: Erica [name] is not on any medications but would like to try a medication.   Hunger is moderately controlled.  Cravings are poorly controlled.  Assessment/Plan:   Erica [name] Erica [name] endorses excessive hunger.  Medication(s): none Appetite:  moderately controlled. Cravings are poorly controlled.   Plan: Medication(s): Wegovy 0.25 mg SQ weekly Will increase water, protein and fiber to help assuage hunger.  Will minimize foods that have a high glucose index/load to  minimize reactive hypoglycemia.   Prediabetes Last A1c was 6.3  Medication(s): none Lab Results  Component Value Date   HGBA1C 6.3 (H) 09/13/2023   Lab Results  Component Value Date   INSULIN 19.1 09/13/2023    Plan: Information sheet on " Insulin Resistance and Prediabetes".  Will minimize all refined carbohydrates both sweets and starches.  Will work on the plan and exercise.  Consider both aerobic and resistance training.  Will keep protein, water, and fiber intake high.  Increase Polyunsaturated and Monounsaturated fats to increase satiety and encourage weight loss.  Aim for 7 to 9 hours of sleep nightly.  Start Wegovy 0.25 mg SQ weekly   Vitamin D Deficiency Vitamin D is not at goal of 50.  Most recent vitamin D level was 8.1. She is not on vitamin D.    Plan: Begin prescription vitamin D 50,000 IU weekly.   Prediabetes Last A1c was 6.3  Medication(s): none Lab Results  Component Value Date   HGBA1C 6.3 (H) 09/13/2023   Lab Results  Component Value Date   INSULIN 19.1 09/13/2023    Plan: Information sheet on " Insulin Resistance and Prediabetes".  Will minimize all refined carbohydrates both sweets and starches.  Will work on the plan and exercise.  Consider both aerobic and resistance training.  Will keep protein, water, and fiber intake high.  Increase Polyunsaturated and Monounsaturated fats to increase satiety and encourage weight loss.  Aim for 7 to 9 hours of sleep nightly.  Start Wegovy 0.25 mg SQ weekly    Morbid Obesity: Current BMI BMI (Calculated): 37.25   Pharmacotherapy Plan Start  Wegovy 0.25 mg SQ weekly  Erica [name]  is currently in the action stage of change. As such, her goal is to continue with weight loss efforts.  She has agreed to the Category 2 plan.  Exercise goals: All adults should avoid inactivity. Some physical activity is better than none, and adults who participate in any amount of physical activity gain some health  benefits.   Behavioral modification strategies: increasing lean protein intake, meal planning , increase water intake, better snacking choices, planning for success, and get rid of junk food in the home.  Erica [name] has agreed to follow-up with our clinic in 2 weeks.     Medications Discontinued During This Encounter  Medication Reason   diclofenac (VOLTAREN) 75 MG EC tablet Patient Preference        Labs reviewed today from last visit (CMP, Lipids, HgbA1c, insulin, vitamin D, B 12, and thyroid panel).   Objective:   VITALS: Per patient if applicable, see vitals. GENERAL: Alert and in no acute distress. CARDIOPULMONARY: No increased WOB. Speaking in clear sentences.  PSYCH: Pleasant and cooperative. Speech normal rate and rhythm. Affect is appropriate. Insight and judgement are appropriate. Attention is focused, linear, and appropriate.  NEURO: Oriented as arrived to appointment on time with no prompting.   Attestation Statements:    This was prepared with the assistance of Engineer, civil (consulting).  Occasional wrong-word or sound-a-like substitutions may have occurred due to the inherent limitations of voice recognition software.   Corinna Capra, DO

## 2023-09-29 NOTE — Telephone Encounter (Signed)
 Wegovy approved from 09/27/23-03/29/24

## 2023-10-12 ENCOUNTER — Ambulatory Visit: Admitting: Bariatrics

## 2023-10-13 ENCOUNTER — Ambulatory Visit: Admitting: Bariatrics

## 2023-10-13 ENCOUNTER — Encounter: Payer: Self-pay | Admitting: Bariatrics

## 2023-10-13 VITALS — BP 125/84 | HR 85 | Temp 98.3°F | Ht 60.5 in | Wt 194.0 lb

## 2023-10-13 DIAGNOSIS — R632 Polyphagia: Secondary | ICD-10-CM

## 2023-10-13 DIAGNOSIS — E559 Vitamin D deficiency, unspecified: Secondary | ICD-10-CM

## 2023-10-13 DIAGNOSIS — R7303 Prediabetes: Secondary | ICD-10-CM | POA: Diagnosis not present

## 2023-10-13 DIAGNOSIS — E669 Obesity, unspecified: Secondary | ICD-10-CM

## 2023-10-13 DIAGNOSIS — Z6837 Body mass index (BMI) 37.0-37.9, adult: Secondary | ICD-10-CM

## 2023-10-13 MED ORDER — VITAMIN D (ERGOCALCIFEROL) 1.25 MG (50000 UNIT) PO CAPS: 50000.0000 [IU] | ORAL_CAPSULE | ORAL | 0 refills | Status: DC

## 2023-10-13 MED ORDER — WEGOVY 0.5 MG/0.5ML ~~LOC~~ SOAJ: 0.5000 mg | SUBCUTANEOUS | 0 refills | Status: DC

## 2023-10-13 NOTE — Progress Notes (Signed)
 WEIGHT SUMMARY AND BIOMETRICS  Weight Lost Since Last Visit: 0  Weight Gained Since Last Visit: 0   Vitals Temp: 98.3 F (36.8 C) BP: 125/84 Pulse Rate: 85 SpO2: 98 %   Anthropometric Measurements Height: 5' 0.5" (1.537 m) Weight: 194 lb (88 kg) BMI (Calculated): 37.25 Weight at Last Visit: 194lb Weight Lost Since Last Visit: 0 Weight Gained Since Last Visit: 0 Starting Weight: 201lb Total Weight Loss (lbs): 7 lb (3.175 kg) Peak Weight: 202lb   Body Composition  Body Fat %: 45.6 % Fat Mass (lbs): 88.6 lbs Muscle Mass (lbs): 100.4 lbs Total Body Water (lbs): 72 lbs Visceral Fat Rating : 12   Other Clinical Data Fasting: no Labs: ni Today's Visit #: 3 Starting Date: 09/13/23    OBESITY Erica [name] is here to discuss her progress with her obesity treatment plan along with follow-up of her obesity related diagnoses.    Nutrition Plan: the Category 2 plan - 80% adherence.  Current exercise: none  Interim History:  Her weight remains the same.  Eating all of the food on the plan., Protein intake is less than prescribed., Is not exceeding snack calorie allotment, Is not skipping meals, Journaling consistently., Not journaling consistently., and Water intake is adequate.   Pharmacotherapy: Erica [name] is on Wegovy 0.25 mg SQ weekly Adverse side effects: None Hunger is moderately controlled.  Cravings are moderately controlled.  Assessment/Plan:   Vitamin D Deficiency Vitamin D is not at goal of 50.  Most recent vitamin D level was 8.1 . She is on  prescription ergocalciferol 50,000 IU weekly. Lab Results  Component Value Date   VD25OH 8.1 (L) 09/13/2023    Plan: Refill prescription vitamin D 50,000 IU weekly.   Polyphagia Erica [name] endorses excessive hunger.  Medication(s): Erica [name]  Effects of medication:  moderately controlled. Cravings  are moderately controlled.   Plan: Medication(s): Wegovy 0.50 mg SQ weekly Will increase water, protein and fiber to help assuage hunger.  Will minimize foods that have a high glucose index/load to minimize reactive hypoglycemia.   Prediabetes Last A1c was 6.3  Medication(s): Wegovy 0.25 mg SQ weekly Lab Results  Component Value Date   HGBA1C 6.3 (H) 09/13/2023   Lab Results  Component Value Date   INSULIN 19.1 09/13/2023    Plan: Will minimize all refined carbohydrates both sweets and starches.  Will work on the plan and exercise.  Consider both aerobic and resistance training.  Will keep protein, water, and fiber intake high.  Will continue to prepare meals at home.  Aim for 7 to 9 hours of sleep nightly.  Continue and increase dose Wegovy 0.50 mg SQ weekly      Generalized Obesity: Current BMI BMI (Calculated): 37.25   Pharmacotherapy Plan Continue and increase dose  Wegovy 0.50 mg SQ weekly  Erica [name] is currently in the action stage of change. As such, her goal  is to continue with weight loss efforts.  She has agreed to the Category 2 plan plus added protein.   Exercise goals: All adults should avoid inactivity. Some physical activity is better than none, and adults who participate in any amount of physical activity gain some health benefits.  Behavioral modification strategies: increasing lean protein intake, no meal skipping, meal planning , better snacking choices, and planning for success.  Erica [name] has agreed to follow-up with our clinic in 2 weeks.    Objective:   VITALS: Per patient if applicable, see vitals. GENERAL: Alert and in no acute distress. CARDIOPULMONARY: No increased WOB. Speaking in clear sentences.  PSYCH: Pleasant and cooperative. Speech normal rate and rhythm. Affect is appropriate. Insight and judgement are appropriate. Attention is focused, linear, and appropriate.  NEURO: Oriented as arrived to appointment on time with no prompting.    Attestation Statements:    This was prepared with the assistance of Engineer, civil (consulting).  Occasional wrong-word or sound-a-like substitutions may have occurred due to the inherent limitations of voice recognition   Corinna Capra, DO

## 2023-10-27 ENCOUNTER — Encounter: Payer: Self-pay | Admitting: Bariatrics

## 2023-10-27 ENCOUNTER — Ambulatory Visit: Admitting: Bariatrics

## 2023-10-27 VITALS — BP 135/87 | HR 82 | Ht 60.5 in | Wt 192.0 lb

## 2023-10-27 DIAGNOSIS — E669 Obesity, unspecified: Secondary | ICD-10-CM | POA: Diagnosis not present

## 2023-10-27 DIAGNOSIS — E559 Vitamin D deficiency, unspecified: Secondary | ICD-10-CM

## 2023-10-27 DIAGNOSIS — R632 Polyphagia: Secondary | ICD-10-CM | POA: Diagnosis not present

## 2023-10-27 DIAGNOSIS — E66812 Obesity, class 2: Secondary | ICD-10-CM

## 2023-10-27 DIAGNOSIS — Z6836 Body mass index (BMI) 36.0-36.9, adult: Secondary | ICD-10-CM

## 2023-10-27 MED ORDER — VITAMIN D (ERGOCALCIFEROL) 1.25 MG (50000 UNIT) PO CAPS: 50000.0000 [IU] | ORAL_CAPSULE | ORAL | 0 refills | Status: DC

## 2023-10-27 MED ORDER — WEGOVY 0.5 MG/0.5ML ~~LOC~~ SOAJ: 0.5000 mg | SUBCUTANEOUS | 0 refills | Status: DC

## 2023-10-27 NOTE — Progress Notes (Signed)
 WEIGHT SUMMARY AND BIOMETRICS  Weight Lost Since Last Visit: 2lb  Weight Gained Since Last Visit: 0   Vitals Temp: 0 F (-17.8 C) (could not obtain) BP: 135/87 Pulse Rate: 82 SpO2: 98 %   Anthropometric Measurements Height: 5' 0.5" (1.537 m) Weight: 192 lb (87.1 kg) BMI (Calculated): 36.87 Weight at Last Visit: 194lb Weight Lost Since Last Visit: 2lb Weight Gained Since Last Visit: 0 Starting Weight: 201lb Total Weight Loss (lbs): 9 lb (4.082 kg) Peak Weight: 202lb   Body Composition  Body Fat %: 46.5 % Fat Mass (lbs): 89.2 lbs Muscle Mass (lbs): 97.6 lbs Total Body Water (lbs): 73.4 lbs Visceral Fat Rating : 12   Other Clinical Data Fasting: no Labs: no Today's Visit #: 4 Starting Date: 09/13/23    OBESITY Erica [name] is here to discuss her progress with her obesity treatment plan along with follow-up of her obesity related diagnoses.    Nutrition Plan: the Category 2 plan - 90% adherence.  Current exercise: none  Interim History:  She is down 2 lbs since her last visit.  Eating all of the food on the plan., Protein intake is as prescribed, and Water intake is adequate.   Pharmacotherapy: Erica [name] is on Wegovy  0.50 mg SQ weekly Adverse side effects: None Hunger is moderately controlled.  Cravings are moderately controlled.  Assessment/Plan:    Vitamin D  Deficiency Vitamin D  is not at goal of 50.  Most recent vitamin D  level was 8.1. She is on  prescription ergocalciferol  50,000 IU weekly. Lab Results  Component Value Date   VD25OH 8.1 (L) 09/13/2023    Plan: Refill prescription vitamin D  50,000 IU weekly.  She will get some mild sun exposure.   Polyphagia Erica [name] endorses excessive hunger.  Medication(s): Wegovy  0.5 mg Effects of medication:  moderately controlled. Cravings are moderately controlled.    Plan: Medication(s): Wegovy  0.50 mg SQ weekly Will increase water, protein and fiber to help assuage hunger.  Will minimize foods that have a high glucose index/load to minimize reactive hypoglycemia.  She will resume Wegovy  after her surgery.  She will have 1 protein shake daily.    Generalized Obesity: Current BMI BMI (Calculated): 36.87   Pharmacotherapy Plan Continue and refill  Wegovy  0.50 mg SQ weekly  Erica [name] is currently in the action stage of change. As such, her goal is to continue with weight loss efforts.  She has agreed to the Category 2 plan.  Exercise goals: All adults should avoid inactivity. Some physical activity is better than none, and adults who participate in any amount of physical activity gain some health benefits.  Behavioral modification strategies: increasing lean protein intake, decreasing simple carbohydrates , no meal skipping, decrease eating out, meal planning , increase water intake, better snacking choices, planning for success, keep healthy foods in the home, and mindful eating.  Erica [name] has agreed to follow-up with  our clinic in 2 weeks.   Objective:   VITALS: Per patient if applicable, see vitals. GENERAL: Alert and in no acute distress. CARDIOPULMONARY: No increased WOB. Speaking in clear sentences.  PSYCH: Pleasant and cooperative. Speech normal rate and rhythm. Affect is appropriate. Insight and judgement are appropriate. Attention is focused, linear, and appropriate.  NEURO: Oriented as arrived to appointment on time with no prompting.   Attestation Statements:    This was prepared with the assistance of Engineer, civil (consulting).  Occasional wrong-word or sound-a-like substitutions may have occurred due to the inherent limitations of voice recognition.   Kirk Peper, DO

## 2023-11-01 ENCOUNTER — Telehealth: Payer: Self-pay | Admitting: Nurse Practitioner

## 2023-11-01 NOTE — Telephone Encounter (Signed)
 Copied from CRM [id number]. Topic: Appointments - Scheduling Inquiry for Clinic >> Nov 01, 2023 12:24 PM Whitney O wrote: Reason for CRM: patient is calling to see if its ok to make a appointment with Cleo Dace, [facility] who she has seen before  Patient doesn't know if its ok to still see her or do she need to see a doctor . Patient is needing a refill also but doesn't know how to go about any of this . Please give patient a call concerning this information . Patient is having excessive  belching just got out of hospital for hip replacement . And just feeling like everything is triggering patient has not been seen since 05/04/2023 with Roetta Clarke [phone]

## 2023-11-02 ENCOUNTER — Encounter (HOSPITAL_COMMUNITY): Payer: Self-pay

## 2023-11-02 ENCOUNTER — Other Ambulatory Visit: Payer: Self-pay

## 2023-11-02 ENCOUNTER — Ambulatory Visit (HOSPITAL_COMMUNITY): Attending: Orthopedic Surgery

## 2023-11-02 DIAGNOSIS — M25651 Stiffness of right hip, not elsewhere classified: Secondary | ICD-10-CM | POA: Diagnosis present

## 2023-11-02 DIAGNOSIS — M6281 Muscle weakness (generalized): Secondary | ICD-10-CM | POA: Diagnosis present

## 2023-11-02 DIAGNOSIS — Z7409 Other reduced mobility: Secondary | ICD-10-CM | POA: Diagnosis present

## 2023-11-02 NOTE — Therapy (Signed)
 OUTPATIENT PHYSICAL THERAPY LOWER EXTREMITY EVALUATION   Patient Name: Erica [name] MRN: [id number] DOB:February 22, 1976, 48 y.o., female Today's Date: 11/03/2023  END OF SESSION:  PT End of Session - 11/02/23 1559     Visit Number 1    Number of Visits 16    Date for PT Re-Evaluation 11/30/23    Authorization Type [place] Medicaid [employer] [place]    Authorization Time Period Auth requested    Progress Note Due on Visit 10    PT Start Time 1307    PT Stop Time 1345    PT Time Calculation (min) 38 min    Activity Tolerance Patient tolerated treatment well;Patient limited by pain    Behavior During Therapy WFL for tasks assessed/performed             Past Medical History:  Diagnosis Date   Acid reflux    Asthma    Back pain    Eczema    Heart burn    High blood pressure    Osteoarthritis    stage 4 right hip   Pelvic inflammatory disease    SOBOE (shortness of breath on exertion)    Tubo-ovarian abscess    Past Surgical History:  Procedure Laterality Date   ABDOMINAL HYSTERECTOMY N/A 07/07/2013   Procedure: HYSTERECTOMY ABDOMINAL with bilateral salpingo-oophorectomy;  Surgeon: Tresia Fruit, MD;  Location: [facility];  Service: Gynecology;  Laterality: N/A;   CESAREAN SECTION     DILATION AND EVACUATION N/A 02/03/2013   Procedure: DILATATION AND EVACUATION;  Surgeon: Tresia Fruit, MD;  Location: [facility];  Service: Gynecology;  Laterality: N/A;   Patient Active Problem List   Diagnosis Date Noted   Prediabetes 09/14/2023   Vitamin D  deficiency 09/14/2023   Elevated cholesterol 09/14/2023   Mild intermittent allergic asthma without complication 05/04/2023   GERD (gastroesophageal reflux disease) 05/04/2023   Preoperative respiratory examination 05/04/2023   Atypical asthma ? VCD 08/19/2021   Cigarette smoker 08/19/2021   Tobacco use disorder 01/23/2021   Chronic PID (chronic pelvic inflammatory disease) 07/07/2013   Right tubo-ovarian abscess 04/20/2013   Pelvic  abscess in female 02/08/2013    PCP: Bolivar Bushman, PA-C  REFERRING PROVIDER: Colvin Dec, MD  REFERRING DIAG: M16.11 (ICD-10-CM) - Unilateral primary osteoarthritis, right hip Z47.1 (ICD-10-CM) - Aftercare following joint replacement surgery Z96.641 (ICD-10-CM) - Presence of right artificial hip joint  THERAPY DIAG:  Impaired functional mobility, balance, gait, and endurance - Plan: PT plan of care cert/re-cert  Muscle weakness (generalized) - Plan: PT plan of care cert/re-cert  Decreased range of right hip movement - Plan: PT plan of care cert/re-cert  Rationale for Evaluation and Treatment: Rehabilitation  ONSET DATE: 1 year for hip pain   SUBJECTIVE:   SUBJECTIVE STATEMENT: R THA on 10/06/23  PERTINENT HISTORY: 3 C-sections Total hysterectomy PAIN:  Are you having pain? Yes: NPRS scale: 6 Pain location: R hip joint, radiating to R knee, lateral aspect  Pain description: Dull, ache, numbness, felt random twinge this morning at incision  Aggravating factors: Walking, mobility Relieving factors: Medication, Ice some  PRECAUTIONS: Anterior hip  RED FLAGS: None   WEIGHT BEARING RESTRICTIONS: No, WBAT  FALLS:  Has patient fallen in last 6 months? No  LIVING ENVIRONMENT: Lives with: lives with their family Stairs: Yes: External: 4 steps; on left going up Has following equipment at home: Single point cane and Walker - 2 wheeled  OCCUPATION: NA  PLOF: Independent  PATIENT GOALS: "Be able  to walk without RW, be normal, be able to lift, be able to get into car normal again"  NEXT MD VISIT: 11/16/23  OBJECTIVE:  Note: Objective measures were completed at Evaluation unless otherwise noted.  DIAGNOSTIC FINDINGS:   PATIENT SURVEYS:  LEFS Lower Extremity Functional Score: 14 / 80 = 17.5 %  COGNITION: Overall cognitive status: Within functional limits for tasks assessed     SENSATION: Light touch: Impaired  Decreased on R greater trochanter/hip  region as compared to L  EDEMA:  Mild edema on R Hip area   POSTURE: No Significant postural limitations  PALPATION: TTP general R hip region   LOWER EXTREMITY ROM:  Active ROM Right eval Left eval  Hip flexion 50   Hip extension 0, to neutral   Hip abduction    Hip adduction    Hip internal rotation    Hip external rotation    Knee flexion    Knee extension    Ankle dorsiflexion    Ankle plantarflexion    Ankle inversion    Ankle eversion     (Blank rows = not tested)  LOWER EXTREMITY MMT:  MMT Right eval Left eval  Hip flexion 2+ 4-  Hip extension  3+/4- seated position  Hip abduction    Hip adduction    Hip internal rotation    Hip external rotation    Knee flexion    Knee extension    Ankle dorsiflexion 5 5  Ankle plantarflexion    Ankle inversion    Ankle eversion     (Blank rows = not tested)   FUNCTIONAL TESTS:  5 times sit to stand: 30", use of BUE to push to stand  2 minute walk test: 90 ft, w/ RW, see gait details below  GAIT: Distance walked: 90 ft Assistive device utilized: Environmental consultant - 2 wheeled Level of assistance: Modified independence Comments: Dec velocity, mild hiking with LLE 2/2 feeling RLE feeling longer, no unsteadiness or LOB to note, reports inc in pain with inc WB into RLE.                                                                                                                               TREATMENT DATE:  11/02/23:  PT Eval and HEP    PATIENT EDUCATION:  Education details: PT evaluation, objective findings, POC, Importance of HEP, Precautions, Clinic policies  Person educated: Patient Education method: Explanation and Demonstration Education comprehension: verbalized understanding and returned demonstration  HOME EXERCISE PROGRAM: Access Code: [email or web address] URL: [email or web address] Date: 11/02/2023 Prepared by: Virgia Griffins Powell-Butler  Exercises - Supine Heel Slide with Strap  - 2 x daily - 7 x weekly  - 3 sets - 10 reps - Supine Quad Set  - 2 x daily - 7 x weekly - 3 sets - 10 reps - 5 hold - Supine Gluteal Sets  - 2 x daily - 7 x weekly - 3 sets - 10 reps -  5 hold - Sit to Stand Without Arm Support  - 2 x daily - 7 x weekly - 3 sets - 10 reps -Daily walking, one lap around house ~ every 2 hours during day.   ASSESSMENT:  CLINICAL IMPRESSION: Patient is a 48 y.o. female who was seen today for physical therapy evaluation and treatment for M16.11 (ICD-10-CM) - Unilateral primary osteoarthritis, right hip Z47.1 (ICD-10-CM) - Aftercare following joint replacement surgery Z96.641 (ICD-10-CM) - Presence of right artificial hip joint. Patient reports to PT Evaluation status post R THA on 10/28/23. On this date, patient demonstrates decreased LE strength, decreased ROM, decreased endurance, impaired balance/coordination which are contributing to patient's pain, altered gait, and impaired function. Patient will benefit from skilled physical therapy in order to address the above deficits in order to return to PLOF, maximize function, and improve QOL.    OBJECTIVE IMPAIRMENTS: Abnormal gait, decreased activity tolerance, decreased balance, decreased coordination, decreased endurance, decreased mobility, difficulty walking, decreased ROM, decreased strength, hypomobility, impaired flexibility, and pain.   ACTIVITY LIMITATIONS: bending, sitting, standing, squatting, stairs, transfers, and bed mobility  PARTICIPATION LIMITATIONS: cleaning, laundry, community activity, and yard work  [facility] POTENTIAL: Good  CLINICAL DECISION MAKING: Stable/uncomplicated  EVALUATION COMPLEXITY: Low   GOALS: Goals reviewed with patient? No  SHORT TERM GOALS: Target date: 11/16/23 Patient will be independent with performance of HEP to demonstrate adequate self management of symptoms.  Baseline:  Goal status: INITIAL  2.   Patient will report at least a 25% improvement with function or pain overall since beginning  PT. Baseline:  Goal status: INITIAL   LONG TERM GOALS: Target date: 12/28/23  Patient will improve LEFS score by 9 points to demonstrate improved perceived function while meeting MCID.  Baseline: Goal status: INITIAL 2.  Patient will improve hip flex ROM to at least 120 degrees to show improved LE mobility for improve functional transfers and QOL. Baseline:  Goal status: INITIAL 3. Patient will improve hip ext ROM to at least 10 degrees to show improved LE mobility to improve gait mechanics. Baseline:  Goal status: INITIAL 4. Patient will score at least 4- on hip flex MMT on RLE to show improved LE strength needed for ambulation without AD. Baseline:  Goal status: INITIAL 4.  Patient will increase gait distance to 200 ft during test with least restrictive assistive device to demonstrate improved functional mobility walking household and community distances.  Baseline: Goal status: INITIAL   PLAN:  PT FREQUENCY: 1-2x/week  PT DURATION: 8 weeks  PLANNED INTERVENTIONS: 97164- PT Re-evaluation, 97110-Therapeutic exercises, 97530- Therapeutic activity, 97112- Neuromuscular re-education, 97535- Self Care, 11914- Manual therapy, [phone]- Gait training, Patient/Family education, Balance training, Stair training, Joint mobilization, Scar mobilization, DME instructions, and Moist heat  PLAN FOR NEXT SESSION: RLE knee flex/ext MMT, Hip abd and IR/ER ROM, assess leg length, prog LE mobility and strengthening within protocol/pt tolerance   10:46 AM, 11/03/23 Marysue Sola, PT, DPT [facility] [place]

## 2023-11-04 ENCOUNTER — Other Ambulatory Visit: Payer: Self-pay

## 2023-11-04 DIAGNOSIS — K219 Gastro-esophageal reflux disease without esophagitis: Secondary | ICD-10-CM

## 2023-11-04 MED ORDER — OMEPRAZOLE 40 MG PO CPDR: 40.0000 mg | DELAYED_RELEASE_CAPSULE | Freq: Every day | ORAL | 1 refills | Status: DC

## 2023-11-04 NOTE — Telephone Encounter (Signed)
 Spoke with patient and instructed her she did need a follow-up as Erica [name] last note states 6 months it also states she can take Pepcid  with the Omeprazole  as she was not doing this. I did send a rf for her Omeprazole  until she can get into see someone for follow-up.

## 2023-11-09 ENCOUNTER — Ambulatory Visit (HOSPITAL_COMMUNITY): Attending: Orthopedic Surgery | Admitting: Physical Therapy

## 2023-11-09 DIAGNOSIS — M25651 Stiffness of right hip, not elsewhere classified: Secondary | ICD-10-CM | POA: Insufficient documentation

## 2023-11-09 DIAGNOSIS — M6281 Muscle weakness (generalized): Secondary | ICD-10-CM | POA: Insufficient documentation

## 2023-11-09 DIAGNOSIS — Z7409 Other reduced mobility: Secondary | ICD-10-CM | POA: Diagnosis present

## 2023-11-09 NOTE — Therapy (Signed)
 OUTPATIENT PHYSICAL THERAPY LOWER EXTREMITY TREATMENT   Patient Name: Erica [name] MRN: [id number] DOB:01/29/1976, 48 y.o., female Today's Date: 11/09/2023  END OF SESSION:  PT End of Session - 11/09/23 1134     Visit Number 2    Number of Visits 16    Date for PT Re-Evaluation 11/30/23    Authorization Type [place] Medicaid [employer] [place]    Authorization Time Period 16 visits approved 4/29-6/30    Authorization - Visit Number 1    Authorization - Number of Visits 16    Progress Note Due on Visit 10    PT Start Time 1020    PT Stop Time 1114    PT Time Calculation (min) 54 min    Activity Tolerance Patient tolerated treatment well;Patient limited by pain    Behavior During Therapy WFL for tasks assessed/performed              Past Medical History:  Diagnosis Date   Acid reflux    Asthma    Back pain    Eczema    Heart burn    High blood pressure    Osteoarthritis    stage 4 right hip   Pelvic inflammatory disease    SOBOE (shortness of breath on exertion)    Tubo-ovarian abscess    Past Surgical History:  Procedure Laterality Date   ABDOMINAL HYSTERECTOMY N/A 07/07/2013   Procedure: HYSTERECTOMY ABDOMINAL with bilateral salpingo-oophorectomy;  Surgeon: Tresia Fruit, MD;  Location: [facility];  Service: Gynecology;  Laterality: N/A;   CESAREAN SECTION     DILATION AND EVACUATION N/A 02/03/2013   Procedure: DILATATION AND EVACUATION;  Surgeon: Tresia Fruit, MD;  Location: [facility];  Service: Gynecology;  Laterality: N/A;   Patient Active Problem List   Diagnosis Date Noted   Prediabetes 09/14/2023   Vitamin D  deficiency 09/14/2023   Elevated cholesterol 09/14/2023   Mild intermittent allergic asthma without complication 05/04/2023   GERD (gastroesophageal reflux disease) 05/04/2023   Preoperative respiratory examination 05/04/2023   Atypical asthma ? VCD 08/19/2021   Cigarette smoker 08/19/2021   Tobacco use disorder 01/23/2021   Chronic PID (chronic  pelvic inflammatory disease) 07/07/2013   Right tubo-ovarian abscess 04/20/2013   Pelvic abscess in female 02/08/2013    PCP: Bolivar Bushman, PA-C  REFERRING PROVIDER: Colvin Dec, MD  REFERRING DIAG: M16.11 (ICD-10-CM) - Unilateral primary osteoarthritis, right hip Z47.1 (ICD-10-CM) - Aftercare following joint replacement surgery Z96.641 (ICD-10-CM) - Presence of right artificial hip joint  THERAPY DIAG:  Impaired functional mobility, balance, gait, and endurance  Muscle weakness (generalized)  Decreased range of right hip movement  Rationale for Evaluation and Treatment: Rehabilitation  ONSET DATE: 1 year for hip pain   SUBJECTIVE:   SUBJECTIVE STATEMENT: Pt reports soreness in Rt hip 6/10.  Questions on POC regarding visits per week; pt did not understand why she was not signed up to come more than 2X week.   PERTINENT HISTORY: R THA on 10/06/23 3 C-sections Total hysterectomy PAIN:  Are you having pain? Yes: NPRS scale: 6 Pain location: R hip joint, radiating to R knee, lateral aspect  Pain description: Dull, ache, numbness, felt random twinge this morning at incision  Aggravating factors: Walking, mobility Relieving factors: Medication, Ice some  PRECAUTIONS: Anterior hip  RED FLAGS: None   WEIGHT BEARING RESTRICTIONS: No, WBAT  FALLS:  Has patient fallen in last 6 months? No  LIVING ENVIRONMENT: Lives with: lives with their family Stairs: Yes:  External: 4 steps; on left going up Has following equipment at home: Single point cane and Walker - 2 wheeled  OCCUPATION: NA  PLOF: Independent  PATIENT GOALS: "Be able to walk without RW, be normal, be able to lift, be able to get into car normal again"  NEXT MD VISIT: 11/16/23  OBJECTIVE:  Note: Objective measures were completed at Evaluation unless otherwise noted.  DIAGNOSTIC FINDINGS:   PATIENT SURVEYS:  LEFS Lower Extremity Functional Score: 14 / 80 = 17.5 %  COGNITION: Overall  cognitive status: Within functional limits for tasks assessed     SENSATION: Light touch: Impaired  Decreased on R greater trochanter/hip region as compared to L  EDEMA:  Mild edema on R Hip area   POSTURE: No Significant postural limitations  PALPATION: TTP general R hip region   LOWER EXTREMITY ROM:  Active ROM Right eval Left eval  Hip flexion 50   Hip extension 0, to neutral   Hip abduction    Hip adduction    Hip internal rotation    Hip external rotation    Knee flexion 4-   Knee extension 3+   Ankle dorsiflexion    Ankle plantarflexion    Ankle inversion    Ankle eversion     (Blank rows = not tested)  LOWER EXTREMITY MMT:  MMT Right eval Left eval  Hip flexion 2+ 4-  Hip extension  3+/4- seated position  Hip abduction    Hip adduction    Hip internal rotation    Hip external rotation    Knee flexion    Knee extension    Ankle dorsiflexion 5 5  Ankle plantarflexion    Ankle inversion    Ankle eversion     (Blank rows = not tested)   FUNCTIONAL TESTS:  5 times sit to stand: 30", use of BUE to push to stand  2 minute walk test: 90 ft, w/ RW, see gait details below  GAIT: Distance walked: 90 ft Assistive device utilized: Environmental consultant - 2 wheeled Level of assistance: Modified independence Comments: Dec velocity, mild hiking with LLE 2/2 feeling RLE feeling longer, no unsteadiness or LOB to note, reports inc in pain with inc WB into RLE.                                                                                                                               TREATMENT DATE:  11/09/23 Goal review/discussion of POC MMT knee flex/ext Seated:  LAQ  Sit to stands no UE assist 10X (cues to shift more weight Rt) Supine:  Heelslides Rt 10X AROM   SLR Rt AAROM with quad sets  Marching alternating 10X  Bridge 10X  SLR AAROM 5X 2 sets Leg length measurements ASIS to med malleoli Rt: 32.5 inches, Lt: 31 inches Gait with SPC Adjustment to  schedule   11/02/23:  PT Eval and HEP    PATIENT EDUCATION:  Education details: PT evaluation, objective  findings, POC, Importance of HEP, Precautions, Clinic policies  Person educated: Patient Education method: Explanation and Demonstration Education comprehension: verbalized understanding and returned demonstration  HOME EXERCISE PROGRAM: Access Code: [email or web address] URL: [email or web address]  Date: 11/02/2023 Prepared by: Virgia Griffins Powell-Butler Exercises - Supine Heel Slide with Strap  - 2 x daily - 7 x weekly - 3 sets - 10 reps - Supine Quad Set  - 2 x daily - 7 x weekly - 3 sets - 10 reps - 5 hold - Supine Gluteal Sets  - 2 x daily - 7 x weekly - 3 sets - 10 reps - 5 hold - Sit to Stand Without Arm Support  - 2 x daily - 7 x weekly - 3 sets - 10 reps -Daily walking, one lap around house ~ every 2 hours during day.   Date: 11/09/2023 Prepared by: Vickii Grand - Seated Long Arc Quad  - 2 x daily - 7 x weekly - 3 sets - 10 reps - 5 sec hold - Supine March  - 2 x daily - 7 x weekly - 3 sets - 10 reps - Supine Bridge  - 2 x daily - 7 x weekly - 3 sets - 10 reps -SLR AAROM-AROM 2X day 3X10  ASSESSMENT:  CLINICAL IMPRESSION: Reviewed goals and POC moving forward.  Pt with questions regarding her PT frequency and had issues with scheduling that therapist was able to adjust.  Tested quad/hamstring strength and progressed exercises to improve functional strength.  Pt still unable to lift Rt LE up (like on bed) using UE's to assist.  Leg length measured with noted difference, Rt being longer than Lt by approximately 1.5 inches.  Urged pt to discuss this with MD at next appt and have him measure as well.   Began gait training using SPC with biggest issue being leg length discrepancy walking on toe of Lt LE.  Added exercises today in supine with AA needed with SLR, however all over she was able to produce active movement independently. Added these exercises to HEP . Pt will continue to  benefit from skilled therapy to improve LE strength, ROM, endurance and impaired balance/coordination to return to PLOF, maximize function, and improve QOL.    OBJECTIVE IMPAIRMENTS: Abnormal gait, decreased activity tolerance, decreased balance, decreased coordination, decreased endurance, decreased mobility, difficulty walking, decreased ROM, decreased strength, hypomobility, impaired flexibility, and pain.   ACTIVITY LIMITATIONS: bending, sitting, standing, squatting, stairs, transfers, and bed mobility  PARTICIPATION LIMITATIONS: cleaning, laundry, community activity, and yard work  [facility] POTENTIAL: Good  CLINICAL DECISION MAKING: Stable/uncomplicated  EVALUATION COMPLEXITY: Low   GOALS: Goals reviewed with patient? No  SHORT TERM GOALS: Target date: 11/16/23 Patient will be independent with performance of HEP to demonstrate adequate self management of symptoms.  Baseline:  Goal status: INITIAL  2.   Patient will report at least a 25% improvement with function or pain overall since beginning PT. Baseline:  Goal status: INITIAL   LONG TERM GOALS: Target date: 12/28/23  Patient will improve LEFS score by 9 points to demonstrate improved perceived function while meeting MCID.  Baseline: Goal status: INITIAL 2.  Patient will improve hip flex ROM to at least 120 degrees to show improved LE mobility for improve functional transfers and QOL. Baseline:  Goal status: INITIAL 3. Patient will improve hip ext ROM to at least 10 degrees to show improved LE mobility to improve gait mechanics. Baseline:  Goal status: INITIAL 4. Patient will score at least 4- on hip flex MMT  on RLE to show improved LE strength needed for ambulation without AD. Baseline:  Goal status: INITIAL 4.  Patient will increase gait distance to 200 ft during test with least restrictive assistive device to demonstrate improved functional mobility walking household and community distances.  Baseline: Goal  status: INITIAL   PLAN:  PT FREQUENCY: 1-2x/week  PT DURATION: 8 weeks  PLANNED INTERVENTIONS: 97164- PT Re-evaluation, 97110-Therapeutic exercises, 97530- Therapeutic activity, 97112- Neuromuscular re-education, 97535- Self Care, 10932- Manual therapy, [phone]- Gait training, Patient/Family education, Balance training, Stair training, Joint mobilization, Scar mobilization, DME instructions, and Moist heat  PLAN FOR NEXT SESSION: Progress LE mobility and strengthening within protocol/pt tolerance   11:37 AM, 11/09/23 Lorenso Romance, PTA/CLT [facility] Ph: [phone]

## 2023-11-12 ENCOUNTER — Encounter (HOSPITAL_COMMUNITY): Payer: Self-pay

## 2023-11-12 ENCOUNTER — Ambulatory Visit (HOSPITAL_COMMUNITY)

## 2023-11-12 DIAGNOSIS — M6281 Muscle weakness (generalized): Secondary | ICD-10-CM

## 2023-11-12 DIAGNOSIS — Z7409 Other reduced mobility: Secondary | ICD-10-CM

## 2023-11-12 DIAGNOSIS — M25651 Stiffness of right hip, not elsewhere classified: Secondary | ICD-10-CM

## 2023-11-12 NOTE — Therapy (Signed)
 OUTPATIENT PHYSICAL THERAPY LOWER EXTREMITY TREATMENT   Patient Name: Erica [name] MRN: [id number] DOB:Jan 14, 1976, 48 y.o., female Today's Date: 11/12/2023  END OF SESSION:  PT End of Session - 11/12/23 1441     Visit Number 3    Number of Visits 16    Date for PT Re-Evaluation 11/30/23    Authorization Type [place] Medicaid [employer] [place]    Authorization Time Period 16 visits approved 4/29-6/30    Authorization - Visit Number 2    Authorization - Number of Visits 16    Progress Note Due on Visit 10    PT Start Time 1440    PT Stop Time 1515    PT Time Calculation (min) 35 min    Activity Tolerance Patient tolerated treatment well;Patient limited by pain    Behavior During Therapy WFL for tasks assessed/performed              Past Medical History:  Diagnosis Date   Acid reflux    Asthma    Back pain    Eczema    Heart burn    High blood pressure    Osteoarthritis    stage 4 right hip   Pelvic inflammatory disease    SOBOE (shortness of breath on exertion)    Tubo-ovarian abscess    Past Surgical History:  Procedure Laterality Date   ABDOMINAL HYSTERECTOMY N/A 07/07/2013   Procedure: HYSTERECTOMY ABDOMINAL with bilateral salpingo-oophorectomy;  Surgeon: Tresia Fruit, MD;  Location: [facility];  Service: Gynecology;  Laterality: N/A;   CESAREAN SECTION     DILATION AND EVACUATION N/A 02/03/2013   Procedure: DILATATION AND EVACUATION;  Surgeon: Tresia Fruit, MD;  Location: [facility];  Service: Gynecology;  Laterality: N/A;   Patient Active Problem List   Diagnosis Date Noted   Prediabetes 09/14/2023   Vitamin D  deficiency 09/14/2023   Elevated cholesterol 09/14/2023   Mild intermittent allergic asthma without complication 05/04/2023   GERD (gastroesophageal reflux disease) 05/04/2023   Preoperative respiratory examination 05/04/2023   Atypical asthma ? VCD 08/19/2021   Cigarette smoker 08/19/2021   Tobacco use disorder 01/23/2021   Chronic PID (chronic  pelvic inflammatory disease) 07/07/2013   Right tubo-ovarian abscess 04/20/2013   Pelvic abscess in female 02/08/2013    PCP: Bolivar Bushman, PA-C  REFERRING PROVIDER: Colvin Dec, MD  REFERRING DIAG: M16.11 (ICD-10-CM) - Unilateral primary osteoarthritis, right hip Z47.1 (ICD-10-CM) - Aftercare following joint replacement surgery Z96.641 (ICD-10-CM) - Presence of right artificial hip joint  THERAPY DIAG:  Impaired functional mobility, balance, gait, and endurance  Decreased range of right hip movement  Muscle weakness (generalized)  Rationale for Evaluation and Treatment: Rehabilitation  ONSET DATE: 1 year for hip pain   SUBJECTIVE:   SUBJECTIVE STATEMENT: Pt reports pain as 4/10. Still doing exercises at home. Still numb is general surgical area. Leg length discrepancy still throwing her off when walking. Pt arrives late to session.  Pt reports soreness in Rt hip 6/10.  Questions on POC regarding visits per week; pt did not understand why she was not signed up to come more than 2X week.   PERTINENT HISTORY: R THA on 10/06/23 3 C-sections Total hysterectomy PAIN:  Are you having pain? Yes: NPRS scale: 6 Pain location: R hip joint, radiating to R knee, lateral aspect  Pain description: Dull, ache, numbness, felt random twinge this morning at incision  Aggravating factors: Walking, mobility Relieving factors: Medication, Ice some  PRECAUTIONS: Anterior hip  RED  FLAGS: None   WEIGHT BEARING RESTRICTIONS: No, WBAT  FALLS:  Has patient fallen in last 6 months? No  LIVING ENVIRONMENT: Lives with: lives with their family Stairs: Yes: External: 4 steps; on left going up Has following equipment at home: Single point cane and Walker - 2 wheeled  OCCUPATION: NA  PLOF: Independent  PATIENT GOALS: "Be able to walk without RW, be normal, be able to lift, be able to get into car normal again"  NEXT MD VISIT: 11/16/23  OBJECTIVE:  Note: Objective measures  were completed at Evaluation unless otherwise noted.  DIAGNOSTIC FINDINGS:   PATIENT SURVEYS:  LEFS Lower Extremity Functional Score: 14 / 80 = 17.5 %  COGNITION: Overall cognitive status: Within functional limits for tasks assessed     SENSATION: Light touch: Impaired  Decreased on R greater trochanter/hip region as compared to L  EDEMA:  Mild edema on R Hip area   POSTURE: No Significant postural limitations  PALPATION: TTP general R hip region   LOWER EXTREMITY ROM:  Active ROM Right eval Left eval Right 11/12/23  Hip flexion 50    Hip extension 0/to neutral  ~5, in prone  Hip abduction   ~20 AROM (~40 PROM)  Hip adduction     Hip internal rotation   18   Hip external rotation   5  Knee flexion     Knee extension     Ankle dorsiflexion     Ankle plantarflexion     Ankle inversion     Ankle eversion      (Blank rows = not tested)  LOWER EXTREMITY MMT:  MMT Right eval Left eval Right  11/09/23  Hip flexion 2+ 4-   Hip extension  3+/4- seated position   Hip abduction     Hip adduction     Hip internal rotation     Hip external rotation     Knee flexion   4-  Knee extension   3+  Ankle dorsiflexion 5 5   Ankle plantarflexion     Ankle inversion     Ankle eversion      (Blank rows = not tested)   FUNCTIONAL TESTS:  5 times sit to stand: 30", use of BUE to push to stand  2 minute walk test: 90 ft, w/ RW, see gait details below  GAIT: Distance walked: 90 ft Assistive device utilized: Environmental consultant - 2 wheeled Level of assistance: Modified independence Comments: Dec velocity, mild hiking with LLE 2/2 feeling RLE feeling longer, no unsteadiness or LOB to note, reports inc in pain with inc WB into RLE.                                                                                                                               TREATMENT DATE:  11/12/23: ROM measurements 226 ft ambulation with SPC, reports no inc in pain, mild vaulting with LLE LAQ,  2x10 Standing hip abd: 10x2 Standing marches:  10x2 Standing heel raises, 2x10  11/09/23 Goal review/discussion of POC MMT knee flex/ext Seated:  LAQ  Sit to stands no UE assist 10X (cues to shift more weight Rt) Supine:  Heelslides Rt 10X AROM   SLR Rt AAROM with quad sets  Marching alternating 10X  Bridge 10X  SLR AAROM 5X 2 sets Leg length measurements ASIS to med malleoli Rt: 32.5 inches, Lt: 31 inches Gait with SPC Adjustment to schedule  11/02/23:  PT Eval and HEP    PATIENT EDUCATION:  Education details: PT evaluation, objective findings, POC, Importance of HEP, Precautions, Clinic policies  Person educated: Patient Education method: Explanation and Demonstration Education comprehension: verbalized understanding and returned demonstration  HOME EXERCISE PROGRAM: Access Code: [email or web address] URL: [email or web address]  Date: 11/02/2023 Prepared by: Virgia Griffins Powell-Butler Exercises - Supine Heel Slide with Strap  - 2 x daily - 7 x weekly - 3 sets - 10 reps - Supine Quad Set  - 2 x daily - 7 x weekly - 3 sets - 10 reps - 5 hold - Supine Gluteal Sets  - 2 x daily - 7 x weekly - 3 sets - 10 reps - 5 hold - Sit to Stand Without Arm Support  - 2 x daily - 7 x weekly - 3 sets - 10 reps -Daily walking, one lap around house ~ every 2 hours during day.   Date: 11/09/2023 Prepared by: Vickii Grand - Seated Long Arc Quad  - 2 x daily - 7 x weekly - 3 sets - 10 reps - 5 sec hold - Supine March  - 2 x daily - 7 x weekly - 3 sets - 10 reps - Supine Bridge  - 2 x daily - 7 x weekly - 3 sets - 10 reps -SLR AAROM-AROM 2X day 3X10   Exercises - Standing Hip Abduction with Counter Support  - 2 x daily - 7 x weekly - 3 sets - 10 reps - Standing March with Counter Support  - 2 x daily - 7 x weekly - 3 sets - 10 reps - Heel Raises with Counter Support  - 2 x daily - 7 x weekly - 3 sets - 10 reps  ASSESSMENT:  CLINICAL IMPRESSION: Patient tolerated session well. Reports 4/10  pain/soreness at start of session. ROM measurements taken, IR/ER,ext abduction, today, see above. Trial of ambulation with SPC full lap around gym, pt stable t/o but demonstrates mild vaulting with LLE 2/2 leg length discrepancy. Encouraged to bring concerns to MD at next visit on 5/13. Mention of possible shoe/heel lift to help with discrepancy. Pt encouraged to use SPC more than RW at this point t/o but still using RW for prolonged distances. Remainder of session spent with standing LE strengthening exercises. Added these to HEP. Patient will benefit from continued skilled physical therapy in order to address LE strength, ROM, endurance, balance, and gait in order to return to PLOF, and improve QOL and function.    Reviewed goals and POC moving forward.  Pt with questions regarding her PT frequency and had issues with scheduling that therapist was able to adjust.  Tested quad/hamstring strength and progressed exercises to improve functional strength.  Pt still unable to lift Rt LE up (like on bed) using UE's to assist.  Leg length measured with noted difference, Rt being longer than Lt by approximately 1.5 inches.  Urged pt to discuss this with MD at next appt and have him measure as well.   Began gait training using SPC with  biggest issue being leg length discrepancy walking on toe of Lt LE.  Added exercises today in supine with AA needed with SLR, however all over she was able to produce active movement independently. Added these exercises to HEP . Pt will continue to benefit from skilled therapy to improve LE strength, ROM, endurance and impaired balance/coordination to return to PLOF, maximize function, and improve QOL.    OBJECTIVE IMPAIRMENTS: Abnormal gait, decreased activity tolerance, decreased balance, decreased coordination, decreased endurance, decreased mobility, difficulty walking, decreased ROM, decreased strength, hypomobility, impaired flexibility, and pain.   ACTIVITY LIMITATIONS:  bending, sitting, standing, squatting, stairs, transfers, and bed mobility  PARTICIPATION LIMITATIONS: cleaning, laundry, community activity, and yard work  [facility] POTENTIAL: Good  CLINICAL DECISION MAKING: Stable/uncomplicated  EVALUATION COMPLEXITY: Low   GOALS: Goals reviewed with patient? No  SHORT TERM GOALS: Target date: 11/16/23 Patient will be independent with performance of HEP to demonstrate adequate self management of symptoms.  Baseline:  Goal status: INITIAL  2.   Patient will report at least a 25% improvement with function or pain overall since beginning PT. Baseline:  Goal status: INITIAL   LONG TERM GOALS: Target date: 12/28/23  Patient will improve LEFS score by 9 points to demonstrate improved perceived function while meeting MCID.  Baseline: Goal status: INITIAL 2.  Patient will improve hip flex ROM to at least 120 degrees to show improved LE mobility for improve functional transfers and QOL. Baseline:  Goal status: INITIAL 3. Patient will improve hip ext ROM to at least 10 degrees to show improved LE mobility to improve gait mechanics. Baseline:  Goal status: INITIAL 4. Patient will score at least 4- on hip flex MMT on RLE to show improved LE strength needed for ambulation without AD. Baseline:  Goal status: INITIAL 4.  Patient will increase gait distance to 200 ft during test with least restrictive assistive device to demonstrate improved functional mobility walking household and community distances.  Baseline: Goal status: INITIAL   PLAN:  PT FREQUENCY: 1-2x/week  PT DURATION: 8 weeks  PLANNED INTERVENTIONS: 97164- PT Re-evaluation, 97110-Therapeutic exercises, 97530- Therapeutic activity, 97112- Neuromuscular re-education, 97535- Self Care, 57846- Manual therapy, [phone]- Gait training, Patient/Family education, Balance training, Stair training, Joint mobilization, Scar mobilization, DME instructions, and Moist heat  PLAN FOR NEXT SESSION:  Progress LE mobility and strengthening within protocol/pt tolerance   4:26 PM, 11/12/23 Tayva Easterday Powell-Butler, PT, DPT [facility] [place]

## 2023-11-16 ENCOUNTER — Encounter (HOSPITAL_COMMUNITY): Admitting: Physical Therapy

## 2023-11-17 ENCOUNTER — Ambulatory Visit (HOSPITAL_COMMUNITY): Payer: Self-pay

## 2023-11-17 ENCOUNTER — Encounter (HOSPITAL_COMMUNITY): Payer: Self-pay

## 2023-11-17 DIAGNOSIS — M6281 Muscle weakness (generalized): Secondary | ICD-10-CM

## 2023-11-17 DIAGNOSIS — M25651 Stiffness of right hip, not elsewhere classified: Secondary | ICD-10-CM

## 2023-11-17 DIAGNOSIS — Z7409 Other reduced mobility: Secondary | ICD-10-CM | POA: Diagnosis not present

## 2023-11-17 NOTE — Therapy (Signed)
 OUTPATIENT PHYSICAL THERAPY LOWER EXTREMITY TREATMENT   Patient Name: Erica [name] MRN: [id number] DOB:March 18, 1976, 48 y.o., female Today's Date: 11/17/2023  END OF SESSION:  PT End of Session - 11/17/23 1515     Visit Number 4    Number of Visits 16    Date for PT Re-Evaluation 11/30/23    Authorization Type [place] Medicaid [employer]    Authorization Time Period 16 visits approved 4/29-6/30    Authorization - Visit Number 3    Authorization - Number of Visits 16    Progress Note Due on Visit 10    PT Start Time 1347    PT Stop Time 1430    PT Time Calculation (min) 43 min    Activity Tolerance Patient tolerated treatment well;Patient limited by pain    Behavior During Therapy WFL for tasks assessed/performed               Past Medical History:  Diagnosis Date   Acid reflux    Asthma    Back pain    Eczema    Heart burn    High blood pressure    Osteoarthritis    stage 4 right hip   Pelvic inflammatory disease    SOBOE (shortness of breath on exertion)    Tubo-ovarian abscess    Past Surgical History:  Procedure Laterality Date   ABDOMINAL HYSTERECTOMY N/A 07/07/2013   Procedure: HYSTERECTOMY ABDOMINAL with bilateral salpingo-oophorectomy;  Surgeon: Tresia Fruit, MD;  Location: [facility];  Service: Gynecology;  Laterality: N/A;   CESAREAN SECTION     DILATION AND EVACUATION N/A 02/03/2013   Procedure: DILATATION AND EVACUATION;  Surgeon: Tresia Fruit, MD;  Location: [facility];  Service: Gynecology;  Laterality: N/A;   Patient Active Problem List   Diagnosis Date Noted   Prediabetes 09/14/2023   Vitamin D  deficiency 09/14/2023   Elevated cholesterol 09/14/2023   Mild intermittent allergic asthma without complication 05/04/2023   GERD (gastroesophageal reflux disease) 05/04/2023   Preoperative respiratory examination 05/04/2023   Atypical asthma ? VCD 08/19/2021   Cigarette smoker 08/19/2021   Tobacco use disorder 01/23/2021   Chronic PID  (chronic pelvic inflammatory disease) 07/07/2013   Right tubo-ovarian abscess 04/20/2013   Pelvic abscess in female 02/08/2013    PCP: Bolivar Bushman, PA-C  REFERRING PROVIDER: Colvin Dec, MD  REFERRING DIAG: M16.11 (ICD-10-CM) - Unilateral primary osteoarthritis, right hip Z47.1 (ICD-10-CM) - Aftercare following joint replacement surgery Z96.641 (ICD-10-CM) - Presence of right artificial hip joint  THERAPY DIAG:  Impaired functional mobility, balance, gait, and endurance  Decreased range of right hip movement  Muscle weakness (generalized)  Rationale for Evaluation and Treatment: Rehabilitation  ONSET DATE: 1 year for hip pain   SUBJECTIVE:   SUBJECTIVE STATEMENT: Pt reports pain has decreased but is still uncomfortable all the time, rated 2/10. Pt reports going to follow up yesterday, feels that R leg is longer, suppose to go back the 20th for testing. Pt reports she has been using the cane in house.   Pt reports soreness in Rt hip 6/10.  Questions on POC regarding visits per week; pt did not understand why she was not signed up to come more than 2X week.   PERTINENT HISTORY: R THA on 10/06/23 3 C-sections Total hysterectomy PAIN:  Are you having pain? Yes: NPRS scale: 6 Pain location: R hip joint, radiating to R knee, lateral aspect  Pain description: Dull, ache, numbness, felt random twinge this morning at  incision  Aggravating factors: Walking, mobility Relieving factors: Medication, Ice some  PRECAUTIONS: Anterior hip  RED FLAGS: None   WEIGHT BEARING RESTRICTIONS: No, WBAT  FALLS:  Has patient fallen in last 6 months? No  LIVING ENVIRONMENT: Lives with: lives with their family Stairs: Yes: External: 4 steps; on left going up Has following equipment at home: Single point cane and Walker - 2 wheeled  OCCUPATION: NA  PLOF: Independent  PATIENT GOALS: "Be able to walk without RW, be normal, be able to lift, be able to get into car normal  again"  NEXT MD VISIT: 11/16/23  OBJECTIVE:  Note: Objective measures were completed at Evaluation unless otherwise noted.  DIAGNOSTIC FINDINGS:   PATIENT SURVEYS:  LEFS Lower Extremity Functional Score: 14 / 80 = 17.5 %  COGNITION: Overall cognitive status: Within functional limits for tasks assessed     SENSATION: Light touch: Impaired  Decreased on R greater trochanter/hip region as compared to L  EDEMA:  Mild edema on R Hip area   POSTURE: No Significant postural limitations  PALPATION: TTP general R hip region   LOWER EXTREMITY ROM:  Active ROM Right eval Left eval Right 11/12/23  Hip flexion 50    Hip extension 0/to neutral  ~5, in prone  Hip abduction   ~20 AROM (~40 PROM)  Hip adduction     Hip internal rotation   18   Hip external rotation   5  Knee flexion     Knee extension     Ankle dorsiflexion     Ankle plantarflexion     Ankle inversion     Ankle eversion      (Blank rows = not tested)  LOWER EXTREMITY MMT:  MMT Right eval Left eval Right  11/09/23  Hip flexion 2+ 4-   Hip extension  3+/4- seated position   Hip abduction     Hip adduction     Hip internal rotation     Hip external rotation     Knee flexion   4-  Knee extension   3+  Ankle dorsiflexion 5 5   Ankle plantarflexion     Ankle inversion     Ankle eversion      (Blank rows = not tested)   FUNCTIONAL TESTS:  5 times sit to stand: 30", use of BUE to push to stand  2 minute walk test: 90 ft, w/ RW, see gait details below  GAIT: Distance walked: 90 ft Assistive device utilized: Environmental consultant - 2 wheeled Level of assistance: Modified independence Comments: Dec velocity, mild hiking with LLE 2/2 feeling RLE feeling longer, no unsteadiness or LOB to note, reports inc in pain with inc WB into RLE.                                                                                                                               TREATMENT DATE:  11/17/2023  Therapeutic Exercise: -Standing  hip flexion and  abduction 2 sets 10 reps, bilaterally, pt cued for upright trunk and maintaining of neutral spine -TKE, 2 sets of 10 reps, bilateral UE support -Standing calf raises, 2 sets of 10 reps  Therapeutic Activity: -Sit to stands, 2 sets of 8 reps, 2nd set with tidal tank sphere -Cone touches bilateral in parallel bars, 2 sets of 10 reps, pt demonstrates LLE vaulting compensation to complete hip flexion -Lateral stepping 3 laps in parallel bars, pt cued for upright posture   11/12/23: ROM measurements 226 ft ambulation with SPC, reports no inc in pain, mild vaulting with LLE LAQ, 2x10 Standing hip abd: 10x2 Standing marches: 10x2 Standing heel raises, 2x10  11/09/23 Goal review/discussion of POC MMT knee flex/ext Seated:  LAQ  Sit to stands no UE assist 10X (cues to shift more weight Rt) Supine:  Heelslides Rt 10X AROM   SLR Rt AAROM with quad sets  Marching alternating 10X  Bridge 10X  SLR AAROM 5X 2 sets Leg length measurements ASIS to med malleoli Rt: 32.5 inches, Lt: 31 inches Gait with SPC Adjustment to schedule  11/02/23:  PT Eval and HEP    PATIENT EDUCATION:  Education details: PT evaluation, objective findings, POC, Importance of HEP, Precautions, Clinic policies  Person educated: Patient Education method: Explanation and Demonstration Education comprehension: verbalized understanding and returned demonstration  HOME EXERCISE PROGRAM: Access Code: [email or web address] URL: [email or web address]  Date: 11/02/2023 Prepared by: Virgia Griffins Powell-Butler Exercises - Supine Heel Slide with Strap  - 2 x daily - 7 x weekly - 3 sets - 10 reps - Supine Quad Set  - 2 x daily - 7 x weekly - 3 sets - 10 reps - 5 hold - Supine Gluteal Sets  - 2 x daily - 7 x weekly - 3 sets - 10 reps - 5 hold - Sit to Stand Without Arm Support  - 2 x daily - 7 x weekly - 3 sets - 10 reps -Daily walking, one lap around house ~ every 2 hours during day.   Date: 11/09/2023 Prepared  by: Vickii Grand - Seated Long Arc Quad  - 2 x daily - 7 x weekly - 3 sets - 10 reps - 5 sec hold - Supine March  - 2 x daily - 7 x weekly - 3 sets - 10 reps - Supine Bridge  - 2 x daily - 7 x weekly - 3 sets - 10 reps -SLR AAROM-AROM 2X day 3X10   Exercises - Standing Hip Abduction with Counter Support  - 2 x daily - 7 x weekly - 3 sets - 10 reps - Standing March with Counter Support  - 2 x daily - 7 x weekly - 3 sets - 10 reps - Heel Raises with Counter Support  - 2 x daily - 7 x weekly - 3 sets - 10 reps  ASSESSMENT:  CLINICAL IMPRESSION: Patient continues to demonstrate discomfort in R hip, decreased LE strength, decreased gait quality and balance. Patient also demonstrates decreased endurance with ambulation. Patient able to progress dynamic balance and core activation exercises today with lateral stepping and STS variation, good performance with verbal cueing. Patient would continue to benefit from skilled physical therapy for increased endurance with ambulation, increased RLE strength, and improved balance for improved quality of life, improved independence with community ambulation and continued progress towards therapy goals.    Reviewed goals and POC moving forward.  Pt with questions regarding her PT frequency and had issues with scheduling that therapist was able to adjust.  Tested quad/hamstring strength and progressed exercises to improve functional strength.  Pt still unable to lift Rt LE up (like on bed) using UE's to assist.  Leg length measured with noted difference, Rt being longer than Lt by approximately 1.5 inches.  Urged pt to discuss this with MD at next appt and have him measure as well.   Began gait training using SPC with biggest issue being leg length discrepancy walking on toe of Lt LE.  Added exercises today in supine with AA needed with SLR, however all over she was able to produce active movement independently. Added these exercises to HEP . Pt will continue to  benefit from skilled therapy to improve LE strength, ROM, endurance and impaired balance/coordination to return to PLOF, maximize function, and improve QOL.    OBJECTIVE IMPAIRMENTS: Abnormal gait, decreased activity tolerance, decreased balance, decreased coordination, decreased endurance, decreased mobility, difficulty walking, decreased ROM, decreased strength, hypomobility, impaired flexibility, and pain.   ACTIVITY LIMITATIONS: bending, sitting, standing, squatting, stairs, transfers, and bed mobility  PARTICIPATION LIMITATIONS: cleaning, laundry, community activity, and yard work  [facility] POTENTIAL: Good  CLINICAL DECISION MAKING: Stable/uncomplicated  EVALUATION COMPLEXITY: Low   GOALS: Goals reviewed with patient? No  SHORT TERM GOALS: Target date: 11/16/23 Patient will be independent with performance of HEP to demonstrate adequate self management of symptoms.  Baseline:  Goal status: INITIAL  2.   Patient will report at least a 25% improvement with function or pain overall since beginning PT. Baseline:  Goal status: INITIAL   LONG TERM GOALS: Target date: 12/28/23  Patient will improve LEFS score by 9 points to demonstrate improved perceived function while meeting MCID.  Baseline: Goal status: INITIAL 2.  Patient will improve hip flex ROM to at least 120 degrees to show improved LE mobility for improve functional transfers and QOL. Baseline:  Goal status: INITIAL 3. Patient will improve hip ext ROM to at least 10 degrees to show improved LE mobility to improve gait mechanics. Baseline:  Goal status: INITIAL 4. Patient will score at least 4- on hip flex MMT on RLE to show improved LE strength needed for ambulation without AD. Baseline:  Goal status: INITIAL 4.  Patient will increase gait distance to 200 ft during test with least restrictive assistive device to demonstrate improved functional mobility walking household and community distances.  Baseline: Goal  status: INITIAL   PLAN:  PT FREQUENCY: 1-2x/week  PT DURATION: 8 weeks  PLANNED INTERVENTIONS: 97164- PT Re-evaluation, 97110-Therapeutic exercises, 97530- Therapeutic activity, 97112- Neuromuscular re-education, 97535- Self Care, 16109- Manual therapy, [phone]- Gait training, Patient/Family education, Balance training, Stair training, Joint mobilization, Scar mobilization, DME instructions, and Moist heat  PLAN FOR NEXT SESSION: Progress LE mobility and strengthening within protocol/pt tolerance   Armond Bertin, PT, DPT [facility] Office: [phone] 3:17 PM, 11/17/23

## 2023-11-19 ENCOUNTER — Encounter (HOSPITAL_COMMUNITY): Payer: Self-pay

## 2023-11-19 ENCOUNTER — Ambulatory Visit (HOSPITAL_COMMUNITY): Payer: Self-pay

## 2023-11-19 DIAGNOSIS — M25651 Stiffness of right hip, not elsewhere classified: Secondary | ICD-10-CM

## 2023-11-19 DIAGNOSIS — Z7409 Other reduced mobility: Secondary | ICD-10-CM | POA: Diagnosis not present

## 2023-11-19 DIAGNOSIS — M6281 Muscle weakness (generalized): Secondary | ICD-10-CM

## 2023-11-19 NOTE — Therapy (Signed)
 OUTPATIENT PHYSICAL THERAPY LOWER EXTREMITY TREATMENT   Patient Name: Erica [name] MRN: [id number] DOB:01/11/1976, 48 y.o., female Today's Date: 11/19/2023  END OF SESSION:  PT End of Session - 11/19/23 1159     Visit Number 5    Number of Visits 16    Date for PT Re-Evaluation 11/30/23    Authorization Type [place] [employer] [place]    Authorization Time Period 16 visits approved 4/29-6/30    Authorization - Visit Number 4    Authorization - Number of Visits 16    Progress Note Due on Visit 10    PT Start Time 1153    PT Stop Time 1230    PT Time Calculation (min) 37 min    Activity Tolerance Patient tolerated treatment well;Patient limited by pain    Behavior During Therapy WFL for tasks assessed/performed               Past Medical History:  Diagnosis Date   Acid reflux    Asthma    Back pain    Eczema    Heart burn    High blood pressure    Osteoarthritis    stage 4 right hip   Pelvic inflammatory disease    SOBOE (shortness of breath on exertion)    Tubo-ovarian abscess    Past Surgical History:  Procedure Laterality Date   ABDOMINAL HYSTERECTOMY N/A 07/07/2013   Procedure: HYSTERECTOMY ABDOMINAL with bilateral salpingo-oophorectomy;  Surgeon: Tresia Fruit, MD;  Location: [facility];  Service: Gynecology;  Laterality: N/A;   CESAREAN SECTION     DILATION AND EVACUATION N/A 02/03/2013   Procedure: DILATATION AND EVACUATION;  Surgeon: Tresia Fruit, MD;  Location: [facility];  Service: Gynecology;  Laterality: N/A;   Patient Active Problem List   Diagnosis Date Noted   Prediabetes 09/14/2023   Vitamin D  deficiency 09/14/2023   Elevated cholesterol 09/14/2023   Mild intermittent allergic asthma without complication 05/04/2023   GERD (gastroesophageal reflux disease) 05/04/2023   Preoperative respiratory examination 05/04/2023   Atypical asthma ? VCD 08/19/2021   Cigarette smoker 08/19/2021   Tobacco use disorder 01/23/2021   Chronic PID  (chronic pelvic inflammatory disease) 07/07/2013   Right tubo-ovarian abscess 04/20/2013   Pelvic abscess in female 02/08/2013    PCP: Bolivar Bushman, PA-C  REFERRING PROVIDER: Colvin Dec, MD  REFERRING DIAG: M16.11 (ICD-10-CM) - Unilateral primary osteoarthritis, right hip Z47.1 (ICD-10-CM) - Aftercare following joint replacement surgery Z96.641 (ICD-10-CM) - Presence of right artificial hip joint  THERAPY DIAG:  Impaired functional mobility, balance, gait, and endurance  Decreased range of right hip movement  Muscle weakness (generalized)  Rationale for Evaluation and Treatment: Rehabilitation  ONSET DATE: 1 year for hip pain   SUBJECTIVE:   SUBJECTIVE STATEMENT: Pt reports 5-6/10 pain on this date. Reports it feels tight.    Pt reports pain has decreased but is still uncomfortable all the time, rated 2/10. Pt reports going to follow up yesterday, feels that R leg is longer, suppose to go back the 20th for testing. Pt reports she has been using the cane in house.   Pt reports soreness in Rt hip 6/10.  Questions on POC regarding visits per week; pt did not understand why she was not signed up to come more than 2X week.   PERTINENT HISTORY: R THA on 10/06/23 3 C-sections Total hysterectomy PAIN:  Are you having pain? Yes: NPRS scale: 6 Pain location: R hip joint, radiating to R knee,  lateral aspect  Pain description: Dull, ache, numbness, felt random twinge this morning at incision  Aggravating factors: Walking, mobility Relieving factors: Medication, Ice some  PRECAUTIONS: Anterior hip  RED FLAGS: None   WEIGHT BEARING RESTRICTIONS: No, WBAT  FALLS:  Has patient fallen in last 6 months? No  LIVING ENVIRONMENT: Lives with: lives with their family Stairs: Yes: External: 4 steps; on left going up Has following equipment at home: Single point cane and Walker - 2 wheeled  OCCUPATION: NA  PLOF: Independent  PATIENT GOALS: "Be able to walk  without RW, be normal, be able to lift, be able to get into car normal again"  NEXT MD VISIT: 11/16/23  OBJECTIVE:  Note: Objective measures were completed at Evaluation unless otherwise noted.  DIAGNOSTIC FINDINGS:   PATIENT SURVEYS:  LEFS Lower Extremity Functional Score: 14 / 80 = 17.5 %  COGNITION: Overall cognitive status: Within functional limits for tasks assessed     SENSATION: Light touch: Impaired  Decreased on R greater trochanter/hip region as compared to L  EDEMA:  Mild edema on R Hip area   POSTURE: No Significant postural limitations  PALPATION: TTP general R hip region   LOWER EXTREMITY ROM:  Active ROM Right eval Left eval Right 11/12/23  Hip flexion 50    Hip extension 0/to neutral  ~5, in prone  Hip abduction   ~20 AROM (~40 PROM)  Hip adduction     Hip internal rotation   18   Hip external rotation   5  Knee flexion     Knee extension     Ankle dorsiflexion     Ankle plantarflexion     Ankle inversion     Ankle eversion      (Blank rows = not tested)  LOWER EXTREMITY MMT:  MMT Right eval Left eval Right  11/09/23  Hip flexion 2+ 4-   Hip extension  3+/4- seated position   Hip abduction     Hip adduction     Hip internal rotation     Hip external rotation     Knee flexion   4-  Knee extension   3+  Ankle dorsiflexion 5 5   Ankle plantarflexion     Ankle inversion     Ankle eversion      (Blank rows = not tested)   FUNCTIONAL TESTS:  5 times sit to stand: 30", use of BUE to push to stand  2 minute walk test: 90 ft, w/ RW, see gait details below  GAIT: Distance walked: 90 ft Assistive device utilized: Environmental consultant - 2 wheeled Level of assistance: Modified independence Comments: Dec velocity, mild hiking with LLE 2/2 feeling RLE feeling longer, no unsteadiness or LOB to note, reports inc in pain with inc WB into RLE.  TREATMENT DATE:  11/19/23: NuStep, level 1, seat 7, 5'  Ambulation w/o SPC, 35 ftx2, CGA for safety Heel raises, 2x10 Cone taps, x10 forward and slightly to R Standing Hip abduction, 2x10 Forward step ups, RLE leading, 4", 2x10, 1st set with UE support, 2nd w/o UE support, v cues for quad activation  Squat to pick up 3 cones from ground level, 2x Myofascial release of R quads with tennis ball, pt long sitting with RLE on mat table, 5'   11/17/2023  Therapeutic Exercise: -Standing hip flexion and abduction 2 sets 10 reps, bilaterally, pt cued for upright trunk and maintaining of neutral spine -TKE, 2 sets of 10 reps, bilateral UE support -Standing calf raises, 2 sets of 10 reps  Therapeutic Activity: -Sit to stands, 2 sets of 8 reps, 2nd set with tidal tank sphere -Cone touches bilateral in parallel bars, 2 sets of 10 reps, pt demonstrates LLE vaulting compensation to complete hip flexion -Lateral stepping 3 laps in parallel bars, pt cued for upright posture   11/12/23: ROM measurements 226 ft ambulation with SPC, reports no inc in pain, mild vaulting with LLE LAQ, 2x10 Standing hip abd: 10x2 Standing marches: 10x2 Standing heel raises, 2x10     PATIENT EDUCATION:  Education details: PT evaluation, objective findings, POC, Importance of HEP, Precautions, Clinic policies  Person educated: Patient Education method: Explanation and Demonstration Education comprehension: verbalized understanding and returned demonstration  HOME EXERCISE PROGRAM: Access Code: [email or web address] URL: [email or web address]  Date: 11/02/2023 Prepared by: Virgia Griffins Powell-Butler Exercises - Supine Heel Slide with Strap  - 2 x daily - 7 x weekly - 3 sets - 10 reps - Supine Quad Set  - 2 x daily - 7 x weekly - 3 sets - 10 reps - 5 hold - Supine Gluteal Sets  - 2 x daily - 7 x weekly - 3 sets - 10 reps - 5 hold - Sit to Stand Without Arm Support  - 2 x daily - 7 x weekly - 3 sets - 10  reps -Daily walking, one lap around house ~ every 2 hours during day.   Date: 11/09/2023 Prepared by: Vickii Grand - Seated Long Arc Quad  - 2 x daily - 7 x weekly - 3 sets - 10 reps - 5 sec hold - Supine March  - 2 x daily - 7 x weekly - 3 sets - 10 reps - Supine Bridge  - 2 x daily - 7 x weekly - 3 sets - 10 reps -SLR AAROM-AROM 2X day 3X10   Exercises - Standing Hip Abduction with Counter Support  - 2 x daily - 7 x weekly - 3 sets - 10 reps - Standing March with Counter Support  - 2 x daily - 7 x weekly - 3 sets - 10 reps - Heel Raises with Counter Support  - 2 x daily - 7 x weekly - 3 sets - 10 reps  ASSESSMENT:  CLINICAL IMPRESSION: Patient tolerated session well. Began with general warm-up on Nustep with pt tolerating well. No reports of inc pain. Followed with a trial of ambulation without an AD. Patient stable t/o but continues to exhibit vaulting/hip hiking compensations. Has a scheduled test at [facility] next week concerning possible leg length discrepancy. At times during cone tap exercise, patient exhibit some circumduction or lateral lean, more notable with inc duration of activity but can self correct. During step ups, pt demo mild antalgic like form that improves with verbal cueing for quad activation before stepping up  onto RLE. Reports squats as "too much" initially but reassures not painful. Verbal cueing given fro equal weight bearing between LE t/o. Ends session with education/demonstration on myofascial release/STM with tennis ball with pt in long sitting position with RLE on mat table. Encouraged to incorporate into routine to aid in tightness she feels in LE but to be mindful of incision. Patient would continue to benefit from skilled physical therapy for increased endurance with ambulation, increased RLE strength, and improved balance for improved QOL, and overall function.    Reviewed goals and POC moving forward.  Pt with questions regarding her PT frequency and had issues  with scheduling that therapist was able to adjust.  Tested quad/hamstring strength and progressed exercises to improve functional strength.  Pt still unable to lift Rt LE up (like on bed) using UE's to assist.  Leg length measured with noted difference, Rt being longer than Lt by approximately 1.5 inches.  Urged pt to discuss this with MD at next appt and have him measure as well.   Began gait training using SPC with biggest issue being leg length discrepancy walking on toe of Lt LE.  Added exercises today in supine with AA needed with SLR, however all over she was able to produce active movement independently. Added these exercises to HEP . Pt will continue to benefit from skilled therapy to improve LE strength, ROM, endurance and impaired balance/coordination to return to PLOF, maximize function, and improve QOL.    OBJECTIVE IMPAIRMENTS: Abnormal gait, decreased activity tolerance, decreased balance, decreased coordination, decreased endurance, decreased mobility, difficulty walking, decreased ROM, decreased strength, hypomobility, impaired flexibility, and pain.   ACTIVITY LIMITATIONS: bending, sitting, standing, squatting, stairs, transfers, and bed mobility  PARTICIPATION LIMITATIONS: cleaning, laundry, community activity, and yard work  [facility] POTENTIAL: Good  CLINICAL DECISION MAKING: Stable/uncomplicated  EVALUATION COMPLEXITY: Low   GOALS: Goals reviewed with patient? No  SHORT TERM GOALS: Target date: 11/16/23 Patient will be independent with performance of HEP to demonstrate adequate self management of symptoms.  Baseline:  Goal status: INITIAL  2.   Patient will report at least a 25% improvement with function or pain overall since beginning PT. Baseline:  Goal status: INITIAL   LONG TERM GOALS: Target date: 12/28/23  Patient will improve LEFS score by 9 points to demonstrate improved perceived function while meeting MCID.  Baseline: Goal status: INITIAL 2.  Patient will  improve hip flex ROM to at least 120 degrees to show improved LE mobility for improve functional transfers and QOL. Baseline:  Goal status: INITIAL 3. Patient will improve hip ext ROM to at least 10 degrees to show improved LE mobility to improve gait mechanics. Baseline:  Goal status: INITIAL 4. Patient will score at least 4- on hip flex MMT on RLE to show improved LE strength needed for ambulation without AD. Baseline:  Goal status: INITIAL 4.  Patient will increase gait distance to 200 ft during test with least restrictive assistive device to demonstrate improved functional mobility walking household and community distances.  Baseline: Goal status: INITIAL   PLAN:  PT FREQUENCY: 1-2x/week  PT DURATION: 8 weeks  PLANNED INTERVENTIONS: 97164- PT Re-evaluation, 97110-Therapeutic exercises, 97530- Therapeutic activity, 97112- Neuromuscular re-education, 97535- Self Care, 16109- Manual therapy, [phone]- Gait training, Patient/Family education, Balance training, Stair training, Joint mobilization, Scar mobilization, DME instructions, and Moist heat  PLAN FOR NEXT SESSION: Progress LE mobility and strengthening within protocol/pt tolerance   12:44 PM, 11/19/23 Aariel Ems Powell-Butler, PT, DPT [facility] -  

## 2023-11-23 ENCOUNTER — Other Ambulatory Visit (HOSPITAL_COMMUNITY): Payer: Self-pay | Admitting: Orthopedic Surgery

## 2023-11-23 DIAGNOSIS — M1611 Unilateral primary osteoarthritis, right hip: Secondary | ICD-10-CM

## 2023-11-23 DIAGNOSIS — M2170 Unequal limb length (acquired), unspecified site: Secondary | ICD-10-CM

## 2023-11-24 ENCOUNTER — Ambulatory Visit: Admitting: Bariatrics

## 2023-11-24 ENCOUNTER — Encounter (HOSPITAL_COMMUNITY): Payer: Self-pay

## 2023-11-24 ENCOUNTER — Ambulatory Visit (HOSPITAL_COMMUNITY)

## 2023-11-24 DIAGNOSIS — Z7409 Other reduced mobility: Secondary | ICD-10-CM

## 2023-11-24 DIAGNOSIS — M25651 Stiffness of right hip, not elsewhere classified: Secondary | ICD-10-CM

## 2023-11-24 DIAGNOSIS — M6281 Muscle weakness (generalized): Secondary | ICD-10-CM

## 2023-11-24 NOTE — Therapy (Signed)
 OUTPATIENT PHYSICAL THERAPY LOWER EXTREMITY TREATMENT   Patient Name: Erica [name] MRN: [id number] DOB:[birth year], 48 y.o., female Today's Date: 11/24/2023  END OF SESSION:  PT End of Session - 11/24/23 0850     Visit Number 6    Number of Visits 16    Date for PT Re-Evaluation 11/30/23    Authorization Type [place] Medicaid [employer] [place]    Authorization Time Period 16 visits approved 4/29-6/30    Authorization - Visit Number 5    Authorization - Number of Visits 16    Progress Note Due on Visit 10    PT Start Time 0850    PT Stop Time 0929    PT Time Calculation (min) 39 min    Activity Tolerance Patient tolerated treatment well    Behavior During Therapy WFL for tasks assessed/performed               Past Medical History:  Diagnosis Date   Acid reflux    Asthma    Back pain    Eczema    Heart burn    High blood pressure    Osteoarthritis    stage 4 right hip   Pelvic inflammatory disease    SOBOE (shortness of breath on exertion)    Tubo-ovarian abscess    Past Surgical History:  Procedure Laterality Date   ABDOMINAL HYSTERECTOMY N/A 07/07/2013   Procedure: HYSTERECTOMY ABDOMINAL with bilateral salpingo-oophorectomy;  Surgeon: Tresia Fruit, MD;  Location: [facility];  Service: Gynecology;  Laterality: N/A;   CESAREAN SECTION     DILATION AND EVACUATION N/A 02/03/2013   Procedure: DILATATION AND EVACUATION;  Surgeon: Tresia Fruit, MD;  Location: [facility];  Service: Gynecology;  Laterality: N/A;   Patient Active Problem List   Diagnosis Date Noted   Prediabetes 09/14/2023   Vitamin D  deficiency 09/14/2023   Elevated cholesterol 09/14/2023   Mild intermittent allergic asthma without complication 05/04/2023   GERD (gastroesophageal reflux disease) 05/04/2023   Preoperative respiratory examination 05/04/2023   Atypical asthma ? VCD 08/19/2021   Cigarette smoker 08/19/2021   Tobacco use disorder 01/23/2021   Chronic PID (chronic pelvic inflammatory  disease) 07/07/2013   Right tubo-ovarian abscess 04/20/2013   Pelvic abscess in female 02/08/2013    PCP: Bolivar Bushman, PA-C  REFERRING PROVIDER: Colvin Dec, MD  REFERRING DIAG: M16.11 (ICD-10-CM) - Unilateral primary osteoarthritis, right hip Z47.1 (ICD-10-CM) - Aftercare following joint replacement surgery Z96.641 (ICD-10-CM) - Presence of right artificial hip joint  THERAPY DIAG:  Impaired functional mobility, balance, gait, and endurance  Decreased range of right hip movement  Muscle weakness (generalized)  Rationale for Evaluation and Treatment: Rehabilitation  ONSET DATE: 1 year for hip pain   SUBJECTIVE:   SUBJECTIVE STATEMENT: Reports increased pain last night.  Pain scale 4/10 burning on thigh.  Was supposed to cat scan yesterday to check on leg length, changed to [place] and scheduled 12/01/23.  Pt reports pain has decreased but is still uncomfortable all the time, rated 2/10. Pt reports going to follow up yesterday, feels that R leg is longer, suppose to go back the 20th for testing. Pt reports she has been using the cane in house.   Pt reports soreness in Rt hip 6/10.  Questions on POC regarding visits per week; pt did not understand why she was not signed up to come more than 2X week.   PERTINENT HISTORY: R THA on 10/06/23 3 C-sections Total hysterectomy PAIN:  Are you  having pain? Yes: NPRS scale: 4 Pain location: R hip joint, radiating to R knee, lateral aspect  Pain description: Dull, ache, numbness, felt random twinge this morning at incision  Aggravating factors: Walking, mobility Relieving factors: Medication, Ice some  PRECAUTIONS: Anterior hip  RED FLAGS: None   WEIGHT BEARING RESTRICTIONS: No, WBAT  FALLS:  Has patient fallen in last 6 months? No  LIVING ENVIRONMENT: Lives with: lives with their family Stairs: Yes: External: 4 steps; on left going up Has following equipment at home: Single point cane and Walker - 2  wheeled  OCCUPATION: NA  PLOF: Independent  PATIENT GOALS: "Be able to walk without RW, be normal, be able to lift, be able to get into car normal again"  NEXT MD VISIT: 11/16/23  OBJECTIVE:  Note: Objective measures were completed at Evaluation unless otherwise noted.  DIAGNOSTIC FINDINGS:   PATIENT SURVEYS:  LEFS Lower Extremity Functional Score: 14 / 80 = 17.5 %  COGNITION: Overall cognitive status: Within functional limits for tasks assessed     SENSATION: Light touch: Impaired  Decreased on R greater trochanter/hip region as compared to L  EDEMA:  Mild edema on R Hip area   POSTURE: No Significant postural limitations  PALPATION: TTP general R hip region   LOWER EXTREMITY ROM:  Active ROM Right eval Left eval Right 11/12/23  Hip flexion 50    Hip extension 0/to neutral  ~5, in prone  Hip abduction   ~20 AROM (~40 PROM)  Hip adduction     Hip internal rotation   18   Hip external rotation   5  Knee flexion     Knee extension     Ankle dorsiflexion     Ankle plantarflexion     Ankle inversion     Ankle eversion      (Blank rows = not tested)  LOWER EXTREMITY MMT:  MMT Right eval Left eval Right  11/09/23  Hip flexion 2+ 4-   Hip extension  3+/4- seated position   Hip abduction     Hip adduction     Hip internal rotation     Hip external rotation     Knee flexion   4-  Knee extension   3+  Ankle dorsiflexion 5 5   Ankle plantarflexion     Ankle inversion     Ankle eversion      (Blank rows = not tested)   FUNCTIONAL TESTS:  5 times sit to stand: 30", use of BUE to push to stand  2 minute walk test: 90 ft, w/ RW, see gait details below  GAIT: Distance walked: 90 ft Assistive device utilized: Environmental consultant - 2 wheeled Level of assistance: Modified independence Comments: Dec velocity, mild hiking with LLE 2/2 feeling RLE feeling longer, no unsteadiness or LOB to note, reports inc in pain with inc WB into RLE.  TREATMENT DATE:  11/24/23: Standing in // bars: - Rockerboard 2' R/L and Df/Pf - Heel raises incline slope 15 (cueing to address knee locking) - Toe raises 15 - Squat front of chair 10x - Sidestep GTB around thigh 2RT Sidelying: - Clam GTB 10x 5"   11/19/23: NuStep, level 1, seat 7, 5'  Ambulation w/o SPC, 35 ftx2, CGA for safety Heel raises, 2x10 Cone taps, x10 forward and slightly to R Standing Hip abduction, 2x10 Forward step ups, RLE leading, 4", 2x10, 1st set with UE support, 2nd w/o UE support, v cues for quad activation  Squat to pick up 3 cones from ground level, 2x Myofascial release of R quads with tennis ball, pt long sitting with RLE on mat table, 5'   11/17/2023  Therapeutic Exercise: -Standing hip flexion and abduction 2 sets 10 reps, bilaterally, pt cued for upright trunk and maintaining of neutral spine -TKE, 2 sets of 10 reps, bilateral UE support -Standing calf raises, 2 sets of 10 reps  Therapeutic Activity: -Sit to stands, 2 sets of 8 reps, 2nd set with tidal tank sphere -Cone touches bilateral in parallel bars, 2 sets of 10 reps, pt demonstrates LLE vaulting compensation to complete hip flexion -Lateral stepping 3 laps in parallel bars, pt cued for upright posture   11/12/23: ROM measurements 226 ft ambulation with SPC, reports no inc in pain, mild vaulting with LLE LAQ, 2x10 Standing hip abd: 10x2 Standing marches: 10x2 Standing heel raises, 2x10     PATIENT EDUCATION:  Education details: PT evaluation, objective findings, POC, Importance of HEP, Precautions, Clinic policies  Person educated: Patient Education method: Explanation and Demonstration Education comprehension: verbalized understanding and returned demonstration  HOME EXERCISE PROGRAM: Access Code: [email or web address] URL: [email or web address]  Date: 11/02/2023 Prepared by: Virgia Griffins  [place] Exercises - Supine Heel Slide with Strap  - 2 x daily - 7 x weekly - 3 sets - 10 reps - Supine Quad Set  - 2 x daily - 7 x weekly - 3 sets - 10 reps - 5 hold - Supine Gluteal Sets  - 2 x daily - 7 x weekly - 3 sets - 10 reps - 5 hold - Sit to Stand Without Arm Support  - 2 x daily - 7 x weekly - 3 sets - 10 reps -Daily walking, one lap around house ~ every 2 hours during day.   Date: 11/09/2023 Prepared by: Vickii Grand - Seated Long Arc Quad  - 2 x daily - 7 x weekly - 3 sets - 10 reps - 5 sec hold - Supine March  - 2 x daily - 7 x weekly - 3 sets - 10 reps - Supine Bridge  - 2 x daily - 7 x weekly - 3 sets - 10 reps -SLR AAROM-AROM 2X day 3X10   Exercises - Standing Hip Abduction with Counter Support  - 2 x daily - 7 x weekly - 3 sets - 10 reps - Standing March with Counter Support  - 2 x daily - 7 x weekly - 3 sets - 10 reps - Heel Raises with Counter Support  - 2 x daily - 7 x weekly - 3 sets - 10 reps  11/24/23: -Clam  ASSESSMENT:  CLINICAL IMPRESSION: Session focus with gait training and gluteal strengthening.  Began session with rockerboard to improve weight distribution with gait.  Added sidestep to improve single leg stance with cueing to reduce trunk flexion and improved gluteal activation.  Added clam to HEP with theraband given to  improve glut med strenghtening as well, pt able to demonstrate and verbalized understanding.  Pt does present with possible leg length discrepancy, cueing to reduce knee locking and reduce circumduction during gait.  Reports apt rescheduled with [place] on 12/01/23.  No reports of increased pain through session.   Reviewed goals and POC moving forward.  Pt with questions regarding her PT frequency and had issues with scheduling that therapist was able to adjust.  Tested quad/hamstring strength and progressed exercises to improve functional strength.  Pt still unable to lift Rt LE up (like on bed) using UE's to assist.  Leg length  measured with noted difference, Rt being longer than Lt by approximately 1.5 inches.  Urged pt to discuss this with MD at next appt and have him measure as well.   Began gait training using SPC with biggest issue being leg length discrepancy walking on toe of Lt LE.  Added exercises today in supine with AA needed with SLR, however all over she was able to produce active movement independently. Added these exercises to HEP . Pt will continue to benefit from skilled therapy to improve LE strength, ROM, endurance and impaired balance/coordination to return to PLOF, maximize function, and improve QOL.    OBJECTIVE IMPAIRMENTS: Abnormal gait, decreased activity tolerance, decreased balance, decreased coordination, decreased endurance, decreased mobility, difficulty walking, decreased ROM, decreased strength, hypomobility, impaired flexibility, and pain.   ACTIVITY LIMITATIONS: bending, sitting, standing, squatting, stairs, transfers, and bed mobility  PARTICIPATION LIMITATIONS: cleaning, laundry, community activity, and yard work  [facility] POTENTIAL: Good  CLINICAL DECISION MAKING: Stable/uncomplicated  EVALUATION COMPLEXITY: Low   GOALS: Goals reviewed with patient? No  SHORT TERM GOALS: Target date: 11/16/23 Patient will be independent with performance of HEP to demonstrate adequate self management of symptoms.  Baseline:  Goal status: INITIAL  2.   Patient will report at least a 25% improvement with function or pain overall since beginning PT. Baseline:  Goal status: INITIAL   LONG TERM GOALS: Target date: 12/28/23  Patient will improve LEFS score by 9 points to demonstrate improved perceived function while meeting MCID.  Baseline: Goal status: INITIAL 2.  Patient will improve hip flex ROM to at least 120 degrees to show improved LE mobility for improve functional transfers and QOL. Baseline:  Goal status: INITIAL 3. Patient will improve hip ext ROM to at least 10 degrees to show  improved LE mobility to improve gait mechanics. Baseline:  Goal status: INITIAL 4. Patient will score at least 4- on hip flex MMT on RLE to show improved LE strength needed for ambulation without AD. Baseline:  Goal status: INITIAL 4.  Patient will increase gait distance to 200 ft during test with least restrictive assistive device to demonstrate improved functional mobility walking household and community distances.  Baseline: Goal status: INITIAL   PLAN:  PT FREQUENCY: 1-2x/week  PT DURATION: 8 weeks  PLANNED INTERVENTIONS: 97164- PT Re-evaluation, 97110-Therapeutic exercises, 97530- Therapeutic activity, 97112- Neuromuscular re-education, 97535- Self Care, 16109- Manual therapy, [phone]- Gait training, Patient/Family education, Balance training, Stair training, Joint mobilization, Scar mobilization, DME instructions, and Moist heat  PLAN FOR NEXT SESSION: Progress LE mobility and strengthening within protocol/pt tolerance  Minor Amble, LPTA/CLT; CBIS [phone]  12:09 PM, 11/24/23

## 2023-11-25 ENCOUNTER — Ambulatory Visit: Admitting: Nurse Practitioner

## 2023-11-25 ENCOUNTER — Ambulatory Visit: Admitting: Bariatrics

## 2023-11-25 ENCOUNTER — Encounter: Payer: Self-pay | Admitting: Nurse Practitioner

## 2023-11-25 VITALS — BP 131/81 | HR 73 | Temp 98.1°F | Ht 60.5 in | Wt 185.0 lb

## 2023-11-25 DIAGNOSIS — Z6835 Body mass index (BMI) 35.0-35.9, adult: Secondary | ICD-10-CM | POA: Diagnosis not present

## 2023-11-25 DIAGNOSIS — E559 Vitamin D deficiency, unspecified: Secondary | ICD-10-CM

## 2023-11-25 DIAGNOSIS — E66812 Obesity, class 2: Secondary | ICD-10-CM

## 2023-11-25 MED ORDER — VITAMIN D (ERGOCALCIFEROL) 1.25 MG (50000 UNIT) PO CAPS: 50000.0000 [IU] | ORAL_CAPSULE | ORAL | 0 refills | Status: DC

## 2023-11-25 MED ORDER — WEGOVY 0.5 MG/0.5ML ~~LOC~~ SOAJ: 0.5000 mg | SUBCUTANEOUS | 0 refills | Status: DC

## 2023-11-25 NOTE — Progress Notes (Signed)
 Office: [phone]  /  Fax: [phone]  WEIGHT SUMMARY AND BIOMETRICS  Weight Lost Since Last Visit: 7lb  Weight Gained Since Last Visit: 0lb   Vitals Temp: 98.1 F (36.7 C) BP: 131/81 Pulse Rate: 73 SpO2: 99 %   Anthropometric Measurements Height: 5' 0.5" (1.537 m) Weight: 185 lb (83.9 kg) BMI (Calculated): 35.52 Weight at Last Visit: 192lb Weight Lost Since Last Visit: 7lb Weight Gained Since Last Visit: 0lb Starting Weight: 201lb Total Weight Loss (lbs): 16 lb (7.258 kg)   Body Composition  Body Fat %: 45.6 % Fat Mass (lbs): 84.8 lbs Muscle Mass (lbs): 95.8 lbs Total Body Water (lbs): 71.6 lbs Visceral Fat Rating : 12   Other Clinical Data Fasting: No Labs: No Today's Visit #: 5 Starting Date: 09/13/23     HPI  Chief Complaint: OBESITY  Erica [name] is here to discuss her progress with her obesity treatment plan. She is on the the Category 2 Plan and states she is following her eating plan approximately 80 % of the time. She states she is exercising 45 minutes 2 days per week.   Interval History:  Since last office visit she has lost 7 pounds.  She notes some polyphagia and cravings.  She is not skipping meals and is eating a protein with each meal.  She is drinking water with electrolytes, coffee and a protein shake.    She had right total hip replacement on April 24th. She is going to PT 2 days per week.    Pharmacotherapy for weight loss: She is currently taking Wegovy  0.5mg  for medical weight loss (stopped x 1 week for surgery).  Denies side effects.    Previous pharmacotherapy for medical weight loss:  GLP-1 off   Bariatric surgery:  Patient has had bariatric surgery.     Vit D deficiency  She is taking Vit D 50,000 IU weekly.  Denies side effects.  Denies nausea, vomiting or muscle weakness.    Lab Results  Component Value Date   VD25OH 8.1 (L) 09/13/2023      PHYSICAL EXAM:  Blood pressure 131/81, pulse 73, temperature 98.1 F  (36.7 C), height 5' 0.5" (1.537 m), weight 185 lb (83.9 kg), last menstrual period 06/17/2013, SpO2 99%. Body mass index is 35.54 kg/m.  General: She is overweight, cooperative, alert, well developed, and in no acute distress. PSYCH: Has normal mood, affect and thought process.   Extremities: No edema.  Neurologic: No gross sensory or motor deficits. No tremors or fasciculations noted.    DIAGNOSTIC DATA REVIEWED:  BMET    Component Value Date/Time   NA 140 09/13/2023 0921   K 3.6 09/13/2023 0921   CL 105 09/13/2023 0921   CO2 20 09/13/2023 0921   GLUCOSE 106 (H) 09/13/2023 0921   GLUCOSE 107 (H) 12/15/2022 2212   BUN 8 09/13/2023 0921   CREATININE 0.64 09/13/2023 0921   CALCIUM 9.5 09/13/2023 0921   GFRNONAA >60 12/15/2022 2212   GFRAA >90 04/21/2013 0625   Lab Results  Component Value Date   HGBA1C 6.3 (H) 09/13/2023   Lab Results  Component Value Date   INSULIN  19.1 09/13/2023   Lab Results  Component Value Date   TSH 0.738 09/13/2023   CBC    Component Value Date/Time   WBC 9.0 12/15/2022 2212   RBC 3.86 (L) 12/15/2022 2212   HGB 12.2 12/15/2022 2212   HGB 12.6 10/17/2021 1632   HCT 37.9 12/15/2022 2212   HCT 38.0 10/17/2021 1632  PLT 291 12/15/2022 2212   PLT 361 10/17/2021 1632   MCV 98.2 12/15/2022 2212   MCV 88 10/17/2021 1632   MCH 31.6 12/15/2022 2212   MCHC 32.2 12/15/2022 2212   RDW 13.6 12/15/2022 2212   RDW 14.4 10/17/2021 1632   Iron Studies No results found for: "IRON", "TIBC", "FERRITIN", "IRONPCTSAT" Lipid Panel     Component Value Date/Time   CHOL 216 (H) 09/13/2023 0921   TRIG 101 09/13/2023 0921   HDL 62 09/13/2023 0921   LDLCALC 136 (H) 09/13/2023 0921   Hepatic Function Panel     Component Value Date/Time   PROT 7.1 09/13/2023 0921   ALBUMIN 4.4 09/13/2023 0921   AST 16 09/13/2023 0921   ALT 12 09/13/2023 0921   ALKPHOS 130 (H) 09/13/2023 0921   BILITOT 0.4 09/13/2023 0921      Component Value Date/Time   TSH 0.738  09/13/2023 1610   Nutritional Lab Results  Component Value Date   VD25OH 8.1 (L) 09/13/2023     ASSESSMENT AND PLAN  TREATMENT PLAN FOR OBESITY:  Recommended Dietary Goals  Erica [name] is currently in the action stage of change. As such, her goal is to continue weight management plan. She has agreed to the Category 2 Plan.  Behavioral Intervention  We discussed the following Behavioral Modification Strategies today: increasing lean protein intake to established goals, increasing vegetables, increasing fiber rich foods, increasing water intake , work on meal planning and preparation, and continue to work on maintaining a reduced calorie state, getting the recommended amount of protein, incorporating whole foods, making healthy choices, staying well hydrated and practicing mindfulness when eating..  Additional resources provided today: NA  Recommended Physical Activity Goals  She is currently going to PT since having surgery.  Will exercise per PT and surgeon's recommendations.      Pharmacotherapy Continue Wegovy  0.5mg .  sSde effects discussed.  Patient requested to increase Wegovy  today.   Patient is losing weight appropriately but is also losing muscle mass.  She needs to increase protein intake and continue working with PT and increase exercise per recommendations.  I discussed safety and side effects associated with Wegovy  such as but not limited to gallstones, pancreatitis, gastroparesis, constipation etc. She recently had a hip replacement.   I discussed extensively with the patient about safety with taking a GLP-1 and not needing to increase the dose with adequate weight loss.  For safety reasons, I discussed the reasons to stay on the lowest dose that is working for her.  At this time, for safety it is not warranted to increase the Wegovy  dose.  We will discuss increasing the dose at future visits if warranted.      ASSOCIATED CONDITIONS ADDRESSED  TODAY  Action/Plan  Vitamin D  deficiency -     Vitamin D  (Ergocalciferol ); Take 1 capsule (50,000 Units total) by mouth every 7 (seven) days.  Dispense: 5 capsule; Refill: 0  Class 2 obesity due to excess calories with body mass index (BMI) of 35.0 to 35.9 in adult, unspecified whether serious comorbidity present -     Wegovy ; Inject 0.5 mg into the skin once a week.  Dispense: 2 mL; Refill: 0         Return in about 3 weeks (around 12/16/2023).Aaron Aas She was informed of the importance of frequent follow up visits to maximize her success with intensive lifestyle modifications for her multiple health conditions.   ATTESTASTION STATEMENTS:  Reviewed by clinician on day of visit: allergies, medications, problem list,  medical history, surgical history, family history, social history, and previous encounter notes.     Crist Dominion. Dareion Kneece FNP-C

## 2023-11-26 ENCOUNTER — Encounter (HOSPITAL_COMMUNITY)

## 2023-11-26 ENCOUNTER — Telehealth (HOSPITAL_COMMUNITY): Payer: Self-pay

## 2023-11-26 NOTE — Telephone Encounter (Signed)
 Called patient concerning missed appointment on this date. Spoke with patient who reported appointment just slipped her mind with school being on early-release and she was having issues with her telephone.  Reminded of upcoming appointment on 11/30/23, at 2:30 pm.   3:26 PM, 11/26/23 Marysue Sola, PT, DPT [facility] [place]

## 2023-11-30 ENCOUNTER — Ambulatory Visit (HOSPITAL_COMMUNITY)

## 2023-11-30 DIAGNOSIS — M25651 Stiffness of right hip, not elsewhere classified: Secondary | ICD-10-CM

## 2023-11-30 DIAGNOSIS — Z7409 Other reduced mobility: Secondary | ICD-10-CM | POA: Diagnosis not present

## 2023-11-30 DIAGNOSIS — M6281 Muscle weakness (generalized): Secondary | ICD-10-CM

## 2023-11-30 NOTE — Therapy (Signed)
 OUTPATIENT PHYSICAL THERAPY LOWER EXTREMITY TREATMENT   Patient Name: Erica [name] MRN: [id number] DOB:[birth year], 48 y.o., female Today's Date: 11/30/2023   Progress Note Reporting Period 11/02/23 to 11/30/23  See note below for Objective Data and Assessment of Progress/Goals.     END OF SESSION:  PT End of Session - 11/30/23 1444     Visit Number 7    Number of Visits 16    Date for PT Re-Evaluation 11/30/23    Authorization Type [place] [employer] [place]    Authorization Time Period 16 visits approved 4/29-6/30    Authorization - Number of Visits 16    Progress Note Due on Visit 10    PT Start Time 1445    PT Stop Time 1515    PT Time Calculation (min) 30 min    Activity Tolerance Patient tolerated treatment well    Behavior During Therapy WFL for tasks assessed/performed               Past Medical History:  Diagnosis Date   Acid reflux    Asthma    Back pain    Eczema    Heart burn    High blood pressure    Osteoarthritis    stage 4 right hip   Pelvic inflammatory disease    SOBOE (shortness of breath on exertion)    Tubo-ovarian abscess    Past Surgical History:  Procedure Laterality Date   ABDOMINAL HYSTERECTOMY N/A 07/07/2013   Procedure: HYSTERECTOMY ABDOMINAL with bilateral salpingo-oophorectomy;  Surgeon: Tresia Fruit, MD;  Location: [facility];  Service: Gynecology;  Laterality: N/A;   CESAREAN SECTION     DILATION AND EVACUATION N/A 02/03/2013   Procedure: DILATATION AND EVACUATION;  Surgeon: Tresia Fruit, MD;  Location: [facility];  Service: Gynecology;  Laterality: N/A;   TOTAL HIP ARTHROPLASTY     Patient Active Problem List   Diagnosis Date Noted   Prediabetes 09/14/2023   Vitamin D  deficiency 09/14/2023   Elevated cholesterol 09/14/2023   Mild intermittent allergic asthma without complication 05/04/2023   GERD (gastroesophageal reflux disease) 05/04/2023   Preoperative respiratory examination 05/04/2023   Atypical asthma  ? VCD 08/19/2021   Cigarette smoker 08/19/2021   Tobacco use disorder 01/23/2021   Chronic PID (chronic pelvic inflammatory disease) 07/07/2013   Right tubo-ovarian abscess 04/20/2013   Pelvic abscess in female 02/08/2013    PCP: Bolivar Bushman, PA-C  REFERRING PROVIDER: Colvin Dec, MD  REFERRING DIAG: M16.11 (ICD-10-CM) - Unilateral primary osteoarthritis, right hip Z47.1 (ICD-10-CM) - Aftercare following joint replacement surgery Z96.641 (ICD-10-CM) - Presence of right artificial hip joint  THERAPY DIAG:  Impaired functional mobility, balance, gait, and endurance  Decreased range of right hip movement  Muscle weakness (generalized)  Rationale for Evaluation and Treatment: Rehabilitation  ONSET DATE: 1 year for hip pain   SUBJECTIVE:   SUBJECTIVE STATEMENT: Pt arrives late to session. Reports she still feels a little off when walking. Goes to get CT scan for leg lengths tomorrow at [facility]. Been walking around the house without cane some. No falls or near falls. Drove herself today. Pain at 2/10.    Pt reports soreness in Rt hip 6/10.  Questions on POC regarding visits per week; pt did not understand why she was not signed up to come more than 2X week.   PERTINENT HISTORY: R THA on 10/06/23 3 C-sections Total hysterectomy PAIN:  Are you having pain? Yes: NPRS scale: 4 Pain location: R  hip joint, radiating to R knee, lateral aspect  Pain description: Dull, ache, numbness, felt random twinge this morning at incision  Aggravating factors: Walking, mobility Relieving factors: Medication, Ice some  PRECAUTIONS: Anterior hip  RED FLAGS: None   WEIGHT BEARING RESTRICTIONS: No, WBAT  FALLS:  Has patient fallen in last 6 months? No  LIVING ENVIRONMENT: Lives with: lives with their family Stairs: Yes: External: 4 steps; on left going up Has following equipment at home: Single point cane and Walker - 2 wheeled  OCCUPATION: NA  PLOF:  Independent  PATIENT GOALS: "Be able to walk without RW, be normal, be able to lift, be able to get into car normal again"  NEXT MD VISIT: 11/16/23  OBJECTIVE:  Note: Objective measures were completed at Evaluation unless otherwise noted.  DIAGNOSTIC FINDINGS:   PATIENT SURVEYS:  LEFS Lower Extremity Functional Score: 14 / 80 = 17.5 % 11/30/23: Lower Extremity Functional Score: 53 / 80 = 66.3 %   COGNITION: Overall cognitive status: Within functional limits for tasks assessed     SENSATION: Light touch: Impaired  Decreased on R greater trochanter/hip region as compared to L  EDEMA:  Mild edema on R Hip area   POSTURE: No Significant postural limitations  PALPATION: TTP general R hip region   LOWER EXTREMITY ROM:  Active ROM Right eval Left eval Right 11/12/23 Right  11/30/23  Hip flexion 50   AROM 100 PROM 110  Hip extension 0/to neutral  ~5, in prone ~10 in prone  Hip abduction   ~20 AROM (~40 PROM)   Hip adduction      Hip internal rotation   18  PROM 25 deg  Hip external rotation   5 PROM 20 deg  Knee flexion      Knee extension      Ankle dorsiflexion      Ankle plantarflexion      Ankle inversion      Ankle eversion       (Blank rows = not tested)  LOWER EXTREMITY MMT:  MMT Right eval Left eval Right  11/09/23 Right  11/30/23  Hip flexion 2+ 4-  3+/4-  Hip extension  3+/4- seated position  4- seated position  Hip abduction      Hip adduction      Hip internal rotation      Hip external rotation      Knee flexion   4- 4-  Knee extension   3+ 4-  Ankle dorsiflexion 5 5    Ankle plantarflexion      Ankle inversion      Ankle eversion       (Blank rows = not tested)   FUNCTIONAL TESTS:  5 times sit to stand: 30", use of BUE to push to stand  2 minute walk test: 90 ft, w/ RW, see gait details below  11/30/23: 5xSTS: 14", no UE use to push : 201', no AD, dec stance time on RLE, mild hiking/vaulting of LLE  GAIT: Distance walked: 90  ft Assistive device utilized: Environmental consultant - 2 wheeled Level of assistance: Modified independence Comments: Dec velocity, mild hiking with LLE 2/2 feeling RLE feeling longer, no unsteadiness or LOB to note, reports inc in pain with inc WB into RLE.  TREATMENT DATE:  11/30/23: Progress Note: LEFS MMT ROM 5xSTS    11/24/23: Standing in // bars: - Rockerboard 2' R/L and Df/Pf - Heel raises incline slope 15 (cueing to address knee locking) - Toe raises 15 - Squat front of chair 10x - Sidestep GTB around thigh 2RT Sidelying: - Clam GTB 10x 5"   11/19/23: NuStep, level 1, seat 7, 5'  Ambulation w/o SPC, 35 ftx2, CGA for safety Heel raises, 2x10 Cone taps, x10 forward and slightly to R Standing Hip abduction, 2x10 Forward step ups, RLE leading, 4", 2x10, 1st set with UE support, 2nd w/o UE support, v cues for quad activation  Squat to pick up 3 cones from ground level, 2x Myofascial release of R quads with tennis ball, pt long sitting with RLE on mat table, 5'   PATIENT EDUCATION:  Education details: PT evaluation, objective findings, POC, Importance of HEP, Precautions, Clinic policies  Person educated: Patient Education method: Explanation and Demonstration Education comprehension: verbalized understanding and returned demonstration  HOME EXERCISE PROGRAM: Access Code: [email or web address] URL: [email or web address]  Date: 11/02/2023 Prepared by: Virgia Griffins Powell-Butler Exercises - Supine Heel Slide with Strap  - 2 x daily - 7 x weekly - 3 sets - 10 reps - Supine Quad Set  - 2 x daily - 7 x weekly - 3 sets - 10 reps - 5 hold - Supine Gluteal Sets  - 2 x daily - 7 x weekly - 3 sets - 10 reps - 5 hold - Sit to Stand Without Arm Support  - 2 x daily - 7 x weekly - 3 sets - 10 reps -Daily walking, one lap around house ~ every 2 hours during day.    Date: 11/09/2023 Prepared by: Vickii Grand - Seated Long Arc Quad  - 2 x daily - 7 x weekly - 3 sets - 10 reps - 5 sec hold - Supine March  - 2 x daily - 7 x weekly - 3 sets - 10 reps - Supine Bridge  - 2 x daily - 7 x weekly - 3 sets - 10 reps -SLR AAROM-AROM 2X day 3X10   Exercises - Standing Hip Abduction with Counter Support  - 2 x daily - 7 x weekly - 3 sets - 10 reps - Standing March with Counter Support  - 2 x daily - 7 x weekly - 3 sets - 10 reps - Heel Raises with Counter Support  - 2 x daily - 7 x weekly - 3 sets - 10 reps  11/24/23: -Clam  ASSESSMENT:  CLINICAL IMPRESSION: Progress note performed on this date. Patient demonstrating improvements with self-perceived function, LE strength, LE ROM, and gait mechanics. Patient progressed to using no UE during sit to stand testing and no AD during 2 minute walk test. Patient steady throughout but continues to demonstrate hiking/vaulting in LLE to compensate. Patient encouraged to increase ambulation in home without AD to tolerance but continued use of AD outside of home with prolonged distances. Formal testing to be done tomorrow regarding possible leg length discrepancy. Patient will benefit from continued skilled physical therapy in order to continue progress towards independence with gait, LE strength, ROM, and balance to improve function/QOL.    Reviewed goals and POC moving forward.  Pt with questions regarding her PT frequency and had issues with scheduling that therapist was able to adjust.  Tested quad/hamstring strength and progressed exercises to improve functional strength.  Pt still unable to lift Rt LE up (like on bed) using UE's  to assist.  Leg length measured with noted difference, Rt being longer than Lt by approximately 1.5 inches.  Urged pt to discuss this with MD at next appt and have him measure as well.   Began gait training using SPC with biggest issue being leg length discrepancy walking on toe of Lt LE.  Added  exercises today in supine with AA needed with SLR, however all over she was able to produce active movement independently. Added these exercises to HEP . Pt will continue to benefit from skilled therapy to improve LE strength, ROM, endurance and impaired balance/coordination to return to PLOF, maximize function, and improve QOL.    OBJECTIVE IMPAIRMENTS: Abnormal gait, decreased activity tolerance, decreased balance, decreased coordination, decreased endurance, decreased mobility, difficulty walking, decreased ROM, decreased strength, hypomobility, impaired flexibility, and pain.   ACTIVITY LIMITATIONS: bending, sitting, standing, squatting, stairs, transfers, and bed mobility  PARTICIPATION LIMITATIONS: cleaning, laundry, community activity, and yard work  [facility] POTENTIAL: Good  CLINICAL DECISION MAKING: Stable/uncomplicated  EVALUATION COMPLEXITY: Low   GOALS: Goals reviewed with patient? No  SHORT TERM GOALS: Target date: 11/16/23 Patient will be independent with performance of HEP to demonstrate adequate self management of symptoms.  Baseline:  Goal status: MET  2.   Patient will report at least a 25% improvement with function or pain overall since beginning PT. Baseline: Reports 40% better on 11/30/23 Goal status: MET   LONG TERM GOALS: Target date: 12/28/23  Patient will improve LEFS score to at least 65/80 to demonstrate improved perceived function while meeting MCID.  Baseline: 53/80 on 11/30/23 Goal status: REVISED 11/30/23 2.  Patient will improve hip flex ROM to at least 120 degrees to show improved LE mobility for improve functional transfers and QOL. Baseline: 100 AROM, 110 PROM on 11/30/23 Goal status: IN PROGRESS 3. Patient will improve hip ext ROM to at least 15 degrees to show improved LE mobility to improve gait mechanics. Baseline: 10 DEG ON 11/30/23 Goal status: REVISED on 11/30/23 4. Patient will score at least 4- on hip flex MMT on RLE to show improved LE  strength needed for ambulation without AD. Baseline:  Goal status: IN PROGRESS 4.  Patient will increase gait distance to 300 ft during test with least restrictive assistive device to demonstrate improved functional mobility walking household and community distances.  Baseline: 201' ON 11/2723 Goal status: REVISED ON 11/30/23   PLAN:  PT FREQUENCY: 1-2x/week  PT DURATION: 8 weeks  PLANNED INTERVENTIONS: 97164- PT Re-evaluation, 97110-Therapeutic exercises, 97530- Therapeutic activity, 97112- Neuromuscular re-education, 97535- Self Care, 09811- Manual therapy, [phone]- Gait training, Patient/Family education, Balance training, Stair training, Joint mobilization, Scar mobilization, DME instructions, and Moist heat  PLAN FOR NEXT SESSION: Progress LE mobility and strengthening within protocol/pt tolerance  4:20 PM, 11/30/23 Peityn Payton Powell-Butler, PT, DPT [facility] [place]

## 2023-12-01 ENCOUNTER — Ambulatory Visit (HOSPITAL_BASED_OUTPATIENT_CLINIC_OR_DEPARTMENT_OTHER)
Admission: RE | Admit: 2023-12-01 | Discharge: 2023-12-01 | Disposition: A | Discharge status: Home / Self Care | Source: Ambulatory Visit | Attending: Orthopedic Surgery | Admitting: Orthopedic Surgery

## 2023-12-01 DIAGNOSIS — M1611 Unilateral primary osteoarthritis, right hip: Secondary | ICD-10-CM | POA: Diagnosis present

## 2023-12-01 DIAGNOSIS — M2170 Unequal limb length (acquired), unspecified site: Secondary | ICD-10-CM | POA: Diagnosis present

## 2023-12-02 ENCOUNTER — Encounter (HOSPITAL_COMMUNITY): Payer: Self-pay

## 2023-12-02 ENCOUNTER — Ambulatory Visit (HOSPITAL_COMMUNITY)

## 2023-12-02 DIAGNOSIS — M6281 Muscle weakness (generalized): Secondary | ICD-10-CM

## 2023-12-02 DIAGNOSIS — Z7409 Other reduced mobility: Secondary | ICD-10-CM | POA: Diagnosis not present

## 2023-12-02 DIAGNOSIS — M25651 Stiffness of right hip, not elsewhere classified: Secondary | ICD-10-CM

## 2023-12-02 NOTE — Therapy (Addendum)
 OUTPATIENT PHYSICAL THERAPY LOWER EXTREMITY TREATMENT   Patient Name: Erica [name] MRN: [id number] DOB:15-Dec-1975, 48 y.o., female Today's Date: 12/02/2023  END OF SESSION:  PT End of Session - 12/02/23 1105     Visit Number 8    Number of Visits 16    Date for PT Re-Evaluation 12/28/23    Authorization Type [place] Medicaid [employer] [place]    Authorization Time Period 16 visits approved 4/29-6/30    Authorization - Visit Number 7    Authorization - Number of Visits 16    Progress Note Due on Visit 10    PT Start Time 1106    PT Stop Time 1146    PT Time Calculation (min) 40 min    Activity Tolerance Patient tolerated treatment well    Behavior During Therapy WFL for tasks assessed/performed               Past Medical History:  Diagnosis Date   Acid reflux    Asthma    Back pain    Eczema    Heart burn    High blood pressure    Osteoarthritis    stage 4 right hip   Pelvic inflammatory disease    SOBOE (shortness of breath on exertion)    Tubo-ovarian abscess    Past Surgical History:  Procedure Laterality Date   ABDOMINAL HYSTERECTOMY N/A 07/07/2013   Procedure: HYSTERECTOMY ABDOMINAL with bilateral salpingo-oophorectomy;  Surgeon: Tresia Fruit, MD;  Location: [facility];  Service: Gynecology;  Laterality: N/A;   CESAREAN SECTION     DILATION AND EVACUATION N/A 02/03/2013   Procedure: DILATATION AND EVACUATION;  Surgeon: Tresia Fruit, MD;  Location: [facility];  Service: Gynecology;  Laterality: N/A;   TOTAL HIP ARTHROPLASTY     Patient Active Problem List   Diagnosis Date Noted   Prediabetes 09/14/2023   Vitamin D  deficiency 09/14/2023   Elevated cholesterol 09/14/2023   Mild intermittent allergic asthma without complication 05/04/2023   GERD (gastroesophageal reflux disease) 05/04/2023   Preoperative respiratory examination 05/04/2023   Atypical asthma ? VCD 08/19/2021   Cigarette smoker 08/19/2021   Tobacco use disorder 01/23/2021   Chronic PID  (chronic pelvic inflammatory disease) 07/07/2013   Right tubo-ovarian abscess 04/20/2013   Pelvic abscess in female 02/08/2013    PCP: Bolivar Bushman, PA-C  REFERRING PROVIDER: Colvin Dec, MD  REFERRING DIAG: M16.11 (ICD-10-CM) - Unilateral primary osteoarthritis, right hip Z47.1 (ICD-10-CM) - Aftercare following joint replacement surgery Z96.641 (ICD-10-CM) - Presence of right artificial hip joint  THERAPY DIAG:  Impaired functional mobility, balance, gait, and endurance  Decreased range of right hip movement  Muscle weakness (generalized)  Rationale for Evaluation and Treatment: Rehabilitation  ONSET DATE: 1 year for hip pain   SUBJECTIVE:   SUBJECTIVE STATEMENT: Hip is a little achy today, no reports of real pain today.  Most difficulty with picking stuff up from floor.  Had cat scan done yesterday to assess leg length, will see results in about a week.    PERTINENT HISTORY: R THA on 10/06/23 3 C-sections Total hysterectomy PAIN:  Are you having pain? Yes: NPRS scale: 0 Pain location: R hip joint, radiating to R knee, lateral aspect  Pain description: Dull, ache, numbness, felt random twinge this morning at incision  Aggravating factors: Walking, mobility Relieving factors: Medication, Ice some  PRECAUTIONS: Anterior hip  RED FLAGS: None   WEIGHT BEARING RESTRICTIONS: No, WBAT  FALLS:  Has patient fallen in last 6  months? No  LIVING ENVIRONMENT: Lives with: lives with their family Stairs: Yes: External: 4 steps; on left going up Has following equipment at home: Single point cane and Walker - 2 wheeled  OCCUPATION: NA  PLOF: Independent  PATIENT GOALS: "Be able to walk without RW, be normal, be able to lift, be able to get into car normal again"  NEXT MD VISIT: 11/16/23  OBJECTIVE:  Note: Objective measures were completed at Evaluation unless otherwise noted.  DIAGNOSTIC FINDINGS:   PATIENT SURVEYS:  LEFS Lower Extremity Functional  Score: 14 / 80 = 17.5 % 11/30/23: Lower Extremity Functional Score: 53 / 80 = 66.3 %   COGNITION: Overall cognitive status: Within functional limits for tasks assessed     SENSATION: Light touch: Impaired  Decreased on R greater trochanter/hip region as compared to L  EDEMA:  Mild edema on R Hip area   POSTURE: No Significant postural limitations  PALPATION: TTP general R hip region   LOWER EXTREMITY ROM:  Active ROM Right eval Left eval Right 11/12/23 Right  11/30/23  Hip flexion 50   AROM 100 PROM 110  Hip extension 0/to neutral  ~5, in prone ~10 in prone  Hip abduction   ~20 AROM (~40 PROM)   Hip adduction      Hip internal rotation   18  PROM 25 deg  Hip external rotation   5 PROM 20 deg  Knee flexion      Knee extension      Ankle dorsiflexion      Ankle plantarflexion      Ankle inversion      Ankle eversion       (Blank rows = not tested)  LOWER EXTREMITY MMT:  MMT Right eval Left eval Right  11/09/23 Right  11/30/23  Hip flexion 2+ 4-  3+/4-  Hip extension  3+/4- seated position  4- seated position  Hip abduction      Hip adduction      Hip internal rotation      Hip external rotation      Knee flexion   4- 4-  Knee extension   3+ 4-  Ankle dorsiflexion 5 5    Ankle plantarflexion      Ankle inversion      Ankle eversion       (Blank rows = not tested)   FUNCTIONAL TESTS:  5 times sit to stand: 30", use of BUE to push to stand  2 minute walk test: 90 ft, w/ RW, see gait details below  11/30/23: 5xSTS: 14", no UE use to push : 201', no AD, dec stance time on RLE, mild hiking/vaulting of LLE  GAIT: Distance walked: 90 ft Assistive device utilized: Environmental consultant - 2 wheeled Level of assistance: Modified independence Comments: Dec velocity, mild hiking with LLE 2/2 feeling RLE feeling longer, no unsteadiness or LOB to note, reports inc in pain with inc WB into RLE.  TREATMENT DATE:  12/02/23: Squat front of mat 10x Lunge onto 4in step 10x3 SLS Lt 40", Rt 16" Tandem stance 1x 30" on foam 2x 30" Toe tapping  7in step height 15x alternating Stairs: Reciprocal pattern 4in 3RT with HR descending  7in, shown how to complete wthout HR available with use of SPC, reciprocal pattern x1RT Sidestep 2RT GTB around thigh  11/30/23: Progress Note: LEFS MMT ROM 5xSTS    11/24/23: Standing in // bars: - Rockerboard 2' R/L and Df/Pf - Heel raises incline slope 15 (cueing to address knee locking) - Toe raises 15 - Squat front of chair 10x - Sidestep GTB around thigh 2RT Sidelying: - Clam GTB 10x 5"   11/19/23: NuStep, level 1, seat 7, 5'  Ambulation w/o SPC, 35 ftx2, CGA for safety Heel raises, 2x10 Cone taps, x10 forward and slightly to R Standing Hip abduction, 2x10 Forward step ups, RLE leading, 4", 2x10, 1st set with UE support, 2nd w/o UE support, v cues for quad activation  Squat to pick up 3 cones from ground level, 2x Myofascial release of R quads with tennis ball, pt long sitting with RLE on mat table, 5'   PATIENT EDUCATION:  Education details: PT evaluation, objective findings, POC, Importance of HEP, Precautions, Clinic policies  Person educated: Patient Education method: Explanation and Demonstration Education comprehension: verbalized understanding and returned demonstration  HOME EXERCISE PROGRAM: Access Code: [email or web address] URL: [email or web address]  Date: 11/02/2023 Prepared by: Virgia Griffins Powell-Butler Exercises - Supine Heel Slide with Strap  - 2 x daily - 7 x weekly - 3 sets - 10 reps - Supine Quad Set  - 2 x daily - 7 x weekly - 3 sets - 10 reps - 5 hold - Supine Gluteal Sets  - 2 x daily - 7 x weekly - 3 sets - 10 reps - 5 hold - Sit to Stand Without Arm Support  - 2 x daily - 7 x weekly - 3 sets - 10 reps -Daily walking, one lap around house ~ every 2 hours during day.    Date: 11/09/2023 Prepared by: Vickii Grand - Seated Long Arc Quad  - 2 x daily - 7 x weekly - 3 sets - 10 reps - 5 sec hold - Supine March  - 2 x daily - 7 x weekly - 3 sets - 10 reps - Supine Bridge  - 2 x daily - 7 x weekly - 3 sets - 10 reps -SLR AAROM-AROM 2X day 3X10   Exercises - Standing Hip Abduction with Counter Support  - 2 x daily - 7 x weekly - 3 sets - 10 reps - Standing March with Counter Support  - 2 x daily - 7 x weekly - 3 sets - 10 reps - Heel Raises with Counter Support  - 2 x daily - 7 x weekly - 3 sets - 10 reps  11/24/23: -Clam  12/02/23: Sidestep with resistance theraband  ASSESSMENT:  CLINICAL IMPRESSION: Session focus with hip stability and functional strengthening.  Discussed stairs, pt presents with hip hiking compensation with 7in step height due to weakness.  Able to demonstrate proper mechanics with 4in step height with reciprocal pattern.  Pt with no handrails on her porch, educated self mechanics with use of cane to assist with stairs.  Added lunges for posterior chain functional strengthening to assist with reaching objects on the floor.  Added SLS and tandem stance with static and dynamic surface that was tolerated well with minimal LOB.  Added sidestep  and SLS to HEP with printout given.   OBJECTIVE IMPAIRMENTS: Abnormal gait, decreased activity tolerance, decreased balance, decreased coordination, decreased endurance, decreased mobility, difficulty walking, decreased ROM, decreased strength, hypomobility, impaired flexibility, and pain.   ACTIVITY LIMITATIONS: bending, sitting, standing, squatting, stairs, transfers, and bed mobility  PARTICIPATION LIMITATIONS: cleaning, laundry, community activity, and yard work  [facility] POTENTIAL: Good  CLINICAL DECISION MAKING: Stable/uncomplicated  EVALUATION COMPLEXITY: Low   GOALS: Goals reviewed with patient? No  SHORT TERM GOALS: Target date: 11/16/23 Patient will be independent with performance of HEP  to demonstrate adequate self management of symptoms.  Baseline:  Goal status: MET  2.   Patient will report at least a 25% improvement with function or pain overall since beginning PT. Baseline: Reports 40% better on 11/30/23 Goal status: MET   LONG TERM GOALS: Target date: 12/28/23  Patient will improve LEFS score to at least 65/80 to demonstrate improved perceived function while meeting MCID.  Baseline: 53/80 on 11/30/23 Goal status: REVISED 11/30/23 2.  Patient will improve hip flex ROM to at least 120 degrees to show improved LE mobility for improve functional transfers and QOL. Baseline: 100 AROM, 110 PROM on 11/30/23 Goal status: IN PROGRESS 3. Patient will improve hip ext ROM to at least 15 degrees to show improved LE mobility to improve gait mechanics. Baseline: 10 DEG ON 11/30/23 Goal status: REVISED on 11/30/23 4. Patient will score at least 4- on hip flex MMT on RLE to show improved LE strength needed for ambulation without AD. Baseline:  Goal status: IN PROGRESS 4.  Patient will increase gait distance to 300 ft during test with least restrictive assistive device to demonstrate improved functional mobility walking household and community distances.  Baseline: 201' ON 11/2723 Goal status: REVISED ON 11/30/23   PLAN:  PT FREQUENCY: 1-2x/week  PT DURATION: 8 weeks  PLANNED INTERVENTIONS: 97164- PT Re-evaluation, 97110-Therapeutic exercises, 97530- Therapeutic activity, 97112- Neuromuscular re-education, 97535- Self Care, 16109- Manual therapy, [phone]- Gait training, Patient/Family education, Balance training, Stair training, Joint mobilization, Scar mobilization, DME instructions, and Moist heat  PLAN FOR NEXT SESSION: Progress LE mobility and strengthening within protocol/pt tolerance  Minor Amble, LPTA/CLT; CBIS [phone]  12:10 PM, 12/02/23

## 2023-12-07 ENCOUNTER — Encounter (HOSPITAL_COMMUNITY)

## 2023-12-09 ENCOUNTER — Encounter (HOSPITAL_COMMUNITY)

## 2023-12-13 ENCOUNTER — Ambulatory Visit (HOSPITAL_COMMUNITY): Attending: Orthopedic Surgery

## 2023-12-13 DIAGNOSIS — M6281 Muscle weakness (generalized): Secondary | ICD-10-CM | POA: Insufficient documentation

## 2023-12-13 DIAGNOSIS — M25651 Stiffness of right hip, not elsewhere classified: Secondary | ICD-10-CM | POA: Insufficient documentation

## 2023-12-13 DIAGNOSIS — Z7409 Other reduced mobility: Secondary | ICD-10-CM | POA: Insufficient documentation

## 2023-12-13 NOTE — Therapy (Signed)
 OUTPATIENT PHYSICAL THERAPY LOWER EXTREMITY TREATMENT   Patient Name: Erica [name] MRN: [id number] DOB:[birth year], 48 y.o., female Today's Date: 12/13/2023  END OF SESSION:  PT End of Session - 12/13/23 0940     Visit Number 9    Number of Visits 16    Date for PT Re-Evaluation 12/28/23    Authorization Type [place] Medicaid [employer] [place]    Authorization Time Period 16 visits approved 4/29-6/30    Authorization - Visit Number 8    Authorization - Number of Visits 16    Progress Note Due on Visit 10    PT Start Time 0941    PT Stop Time 1015    PT Time Calculation (min) 34 min    Activity Tolerance Patient tolerated treatment well    Behavior During Therapy WFL for tasks assessed/performed               Past Medical History:  Diagnosis Date   Acid reflux    Asthma    Back pain    Eczema    Heart burn    High blood pressure    Osteoarthritis    stage 4 right hip   Pelvic inflammatory disease    SOBOE (shortness of breath on exertion)    Tubo-ovarian abscess    Past Surgical History:  Procedure Laterality Date   ABDOMINAL HYSTERECTOMY N/A 07/07/2013   Procedure: HYSTERECTOMY ABDOMINAL with bilateral salpingo-oophorectomy;  Surgeon: Tresia Fruit, MD;  Location: [facility];  Service: Gynecology;  Laterality: N/A;   CESAREAN SECTION     DILATION AND EVACUATION N/A 02/03/2013   Procedure: DILATATION AND EVACUATION;  Surgeon: Tresia Fruit, MD;  Location: [facility];  Service: Gynecology;  Laterality: N/A;   TOTAL HIP ARTHROPLASTY     Patient Active Problem List   Diagnosis Date Noted   Prediabetes 09/14/2023   Vitamin D  deficiency 09/14/2023   Elevated cholesterol 09/14/2023   Mild intermittent allergic asthma without complication 05/04/2023   GERD (gastroesophageal reflux disease) 05/04/2023   Preoperative respiratory examination 05/04/2023   Atypical asthma ? VCD 08/19/2021   Cigarette smoker 08/19/2021   Tobacco use disorder 01/23/2021   Chronic PID  (chronic pelvic inflammatory disease) 07/07/2013   Right tubo-ovarian abscess 04/20/2013   Pelvic abscess in female 02/08/2013    PCP: Bolivar Bushman, PA-C  REFERRING PROVIDER: Colvin Dec, MD  REFERRING DIAG: M16.11 (ICD-10-CM) - Unilateral primary osteoarthritis, right hip Z47.1 (ICD-10-CM) - Aftercare following joint replacement surgery Z96.641 (ICD-10-CM) - Presence of right artificial hip joint  THERAPY DIAG:  Impaired functional mobility, balance, gait, and endurance  Decreased range of right hip movement  Muscle weakness (generalized)  Rationale for Evaluation and Treatment: Rehabilitation  ONSET DATE: 1 year for hip pain   SUBJECTIVE:   SUBJECTIVE STATEMENT: Pt late to session. Pt reports she is still having some numbness, burning, and discomfort on lateral aspect of R thigh to knee, around 5/10 at times. Saw her results about leg length discrepancy. Is concerned. Waiting to hear back from MD. Results under imaging   PERTINENT HISTORY: R THA on 10/06/23 3 C-sections Total hysterectomy PAIN:  Are you having pain? Yes: NPRS scale: 0 Pain location: R hip joint, radiating to R knee, lateral aspect  Pain description: Dull, ache, numbness, felt random twinge this morning at incision  Aggravating factors: Walking, mobility Relieving factors: Medication, Ice some  PRECAUTIONS: Anterior hip  RED FLAGS: None   WEIGHT BEARING RESTRICTIONS: No, WBAT  FALLS:  Has patient fallen in last 6 months? No  LIVING ENVIRONMENT: Lives with: lives with their family Stairs: Yes: External: 4 steps; on left going up Has following equipment at home: Single point cane and Walker - 2 wheeled  OCCUPATION: NA  PLOF: Independent  PATIENT GOALS: "Be able to walk without RW, be normal, be able to lift, be able to get into car normal again"  NEXT MD VISIT: 11/16/23  OBJECTIVE:  Note: Objective measures were completed at Evaluation unless otherwise noted.  DIAGNOSTIC  FINDINGS:   PATIENT SURVEYS:  LEFS Lower Extremity Functional Score: 14 / 80 = 17.5 % 11/30/23: Lower Extremity Functional Score: 53 / 80 = 66.3 %   COGNITION: Overall cognitive status: Within functional limits for tasks assessed     SENSATION: Light touch: Impaired  Decreased on R greater trochanter/hip region as compared to L  EDEMA:  Mild edema on R Hip area   POSTURE: No Significant postural limitations  PALPATION: TTP general R hip region   LOWER EXTREMITY ROM:  Active ROM Right eval Left eval Right 11/12/23 Right  11/30/23  Hip flexion 50   AROM 100 PROM 110  Hip extension 0/to neutral  ~5, in prone ~10 in prone  Hip abduction   ~20 AROM (~40 PROM)   Hip adduction      Hip internal rotation   18  PROM 25 deg  Hip external rotation   5 PROM 20 deg  Knee flexion      Knee extension      Ankle dorsiflexion      Ankle plantarflexion      Ankle inversion      Ankle eversion       (Blank rows = not tested)  LOWER EXTREMITY MMT:  MMT Right eval Left eval Right  11/09/23 Right  11/30/23  Hip flexion 2+ 4-  3+/4-  Hip extension  3+/4- seated position  4- seated position  Hip abduction      Hip adduction      Hip internal rotation      Hip external rotation      Knee flexion   4- 4-  Knee extension   3+ 4-  Ankle dorsiflexion 5 5    Ankle plantarflexion      Ankle inversion      Ankle eversion       (Blank rows = not tested)   FUNCTIONAL TESTS:  5 times sit to stand: 30", use of BUE to push to stand  2 minute walk test: 90 ft, w/ RW, see gait details below  11/30/23: 5xSTS: 14", no UE use to push : 201', no AD, dec stance time on RLE, mild hiking/vaulting of LLE  GAIT: Distance walked: 90 ft Assistive device utilized: Environmental consultant - 2 wheeled Level of assistance: Modified independence Comments: Dec velocity, mild hiking with LLE 2/2 feeling RLE feeling longer, no unsteadiness or LOB to note, reports inc in pain with inc WB into RLE.  TREATMENT DATE:  12/13/23: Ambulation, 257ft x 2, no AD PROM, pt supine, into IR-ER-Flexion, to pt tolerance Supine active IR/ER stretching, 10x each direction Bridges, 2x10 SKTC/March, 2x10, RLE only, TA contraction  Prone, IR/ER, knee flexed, 2x10 each way, with mirror for visual feedback Sidestep, GTB around thigh, 20 ftx2 Monster walk, forward 20 ft, backward 10ft   12/02/23: Squat front of mat 10x Lunge onto 4in step 10x3 SLS Lt 40", Rt 16" Tandem stance 1x 30" on foam 2x 30" Toe tapping  7in step height 15x alternating Stairs: Reciprocal pattern 4in 3RT with HR descending  7in, shown how to complete wthout HR available with use of SPC, reciprocal pattern x1RT Sidestep 2RT GTB around thigh  11/30/23: Progress Note: LEFS MMT ROM 5xSTS     PATIENT EDUCATION:  Education details: PT evaluation, objective findings, POC, Importance of HEP, Precautions, Clinic policies  Person educated: Patient Education method: Explanation and Demonstration Education comprehension: verbalized understanding and returned demonstration  HOME EXERCISE PROGRAM: Access Code: [email or web address] URL: [email or web address]  Date: 11/02/2023 Prepared by: Virgia Griffins Powell-Butler Exercises - Supine Heel Slide with Strap  - 2 x daily - 7 x weekly - 3 sets - 10 reps - Supine Quad Set  - 2 x daily - 7 x weekly - 3 sets - 10 reps - 5 hold - Supine Gluteal Sets  - 2 x daily - 7 x weekly - 3 sets - 10 reps - 5 hold - Sit to Stand Without Arm Support  - 2 x daily - 7 x weekly - 3 sets - 10 reps -Daily walking, one lap around house ~ every 2 hours during day.   Date: 11/09/2023 Prepared by: Vickii Grand - Seated Long Arc Quad  - 2 x daily - 7 x weekly - 3 sets - 10 reps - 5 sec hold - Supine March  - 2 x daily - 7 x weekly - 3 sets - 10 reps - Supine Bridge  - 2 x daily - 7 x weekly - 3 sets -  10 reps -SLR AAROM-AROM 2X day 3X10   Exercises - Standing Hip Abduction with Counter Support  - 2 x daily - 7 x weekly - 3 sets - 10 reps - Standing March with Counter Support  - 2 x daily - 7 x weekly - 3 sets - 10 reps - Heel Raises with Counter Support  - 2 x daily - 7 x weekly - 3 sets - 10 reps  11/24/23: -Clam  12/02/23: Sidestep with resistance theraband  12/13/23: Exercises - Supine Hip Internal and External Rotation  - 2 x daily - 7 x weekly - 3 sets - 10 reps - Prone Alternating Bent Leg Fallout  - 2 x daily - 7 x weekly - 3 sets - 10 reps  ASSESSMENT:  CLINICAL IMPRESSION: Patient tolerated session well on this date. Ambulates in session without any AD. General tissue warm up with ambulation around clinic. Slight limp remains but good improvements noted today with usual gait deviations. Patient reporting sometimes the difference in leg discrepancy throws off her balance. None seen in today's session.Today, focused on regaining full ROM into IR and ER of R hip. Patient demos good PROM with ER, ~ 45 degrees before discomfort begins. Limited with passive IR to ~25-30 degrees. Addition of IR/ER stretches in supine and prone positions. Added mirror in session for visual feedback of motion. Pt reporting continued difficulty with getting RLE into car. Pt with improved hip flexion strength during SKTC/march.  Remainder of session with dynamic resisted LE strengthening. Patient will benefit from continued skilled physical therapy in order to continue progress towards goals and improve LE strength, ROM, endurance, and balance to improve function/QOL.    OBJECTIVE IMPAIRMENTS: Abnormal gait, decreased activity tolerance, decreased balance, decreased coordination, decreased endurance, decreased mobility, difficulty walking, decreased ROM, decreased strength, hypomobility, impaired flexibility, and pain.   ACTIVITY LIMITATIONS: bending, sitting, standing, squatting, stairs, transfers, and bed  mobility  PARTICIPATION LIMITATIONS: cleaning, laundry, community activity, and yard work  [facility] POTENTIAL: Good  CLINICAL DECISION MAKING: Stable/uncomplicated  EVALUATION COMPLEXITY: Low   GOALS: Goals reviewed with patient? No  SHORT TERM GOALS: Target date: 11/16/23 Patient will be independent with performance of HEP to demonstrate adequate self management of symptoms.  Baseline:  Goal status: MET  2.   Patient will report at least a 25% improvement with function or pain overall since beginning PT. Baseline: Reports 40% better on 11/30/23 Goal status: MET   LONG TERM GOALS: Target date: 12/28/23  Patient will improve LEFS score to at least 65/80 to demonstrate improved perceived function while meeting MCID.  Baseline: 53/80 on 11/30/23 Goal status: REVISED 11/30/23 2.  Patient will improve hip flex ROM to at least 120 degrees to show improved LE mobility for improve functional transfers and QOL. Baseline: 100 AROM, 110 PROM on 11/30/23 Goal status: IN PROGRESS 3. Patient will improve hip ext ROM to at least 15 degrees to show improved LE mobility to improve gait mechanics. Baseline: 10 DEG ON 11/30/23 Goal status: REVISED on 11/30/23 4. Patient will score at least 4- on hip flex MMT on RLE to show improved LE strength needed for ambulation without AD. Baseline:  Goal status: IN PROGRESS 4.  Patient will increase gait distance to 300 ft during test with least restrictive assistive device to demonstrate improved functional mobility walking household and community distances.  Baseline: 201' ON 11/2723 Goal status: REVISED ON 11/30/23   PLAN:  PT FREQUENCY: 1-2x/week  PT DURATION: 8 weeks  PLANNED INTERVENTIONS: 97164- PT Re-evaluation, 97110-Therapeutic exercises, 97530- Therapeutic activity, 97112- Neuromuscular re-education, 97535- Self Care, 16109- Manual therapy, [phone]- Gait training, Patient/Family education, Balance training, Stair training, Joint mobilization, Scar  mobilization, DME instructions, and Moist heat  PLAN FOR NEXT SESSION: Progress LE mobility and strengthening within protocol/pt tolerance   11:08 AM, 12/13/23 Marysue Sola, PT, DPT [facility] [place]

## 2023-12-17 ENCOUNTER — Ambulatory Visit (HOSPITAL_COMMUNITY)

## 2023-12-17 ENCOUNTER — Encounter (HOSPITAL_COMMUNITY): Payer: Self-pay

## 2023-12-17 DIAGNOSIS — Z7409 Other reduced mobility: Secondary | ICD-10-CM

## 2023-12-17 DIAGNOSIS — M6281 Muscle weakness (generalized): Secondary | ICD-10-CM

## 2023-12-17 DIAGNOSIS — M25651 Stiffness of right hip, not elsewhere classified: Secondary | ICD-10-CM

## 2023-12-17 NOTE — Therapy (Signed)
 OUTPATIENT PHYSICAL THERAPY LOWER EXTREMITY TREATMENT   Patient Name: Erica [name] MRN: [id number] DOB:10/24/1975, 48 y.o., female Today's Date: 12/17/2023  END OF SESSION:  PT End of Session - 12/17/23 1103     Visit Number 10    Number of Visits 16    Date for PT Re-Evaluation 12/28/23    Authorization Type [place] Medicaid [employer] [place]    Authorization Time Period 16 visits approved 4/29-6/30    Authorization - Visit Number 9    Authorization - Number of Visits 16    Progress Note Due on Visit 16   Progress note compete visit #7   PT Start Time 1105    PT Stop Time 1143    PT Time Calculation (min) 38 min    Activity Tolerance Patient tolerated treatment well    Behavior During Therapy WFL for tasks assessed/performed            Past Medical History:  Diagnosis Date   Acid reflux    Asthma    Back pain    Eczema    Heart burn    High blood pressure    Osteoarthritis    stage 4 right hip   Pelvic inflammatory disease    SOBOE (shortness of breath on exertion)    Tubo-ovarian abscess    Past Surgical History:  Procedure Laterality Date   ABDOMINAL HYSTERECTOMY N/A 07/07/2013   Procedure: HYSTERECTOMY ABDOMINAL with bilateral salpingo-oophorectomy;  Surgeon: Tresia Fruit, MD;  Location: [facility];  Service: Gynecology;  Laterality: N/A;   CESAREAN SECTION     DILATION AND EVACUATION N/A 02/03/2013   Procedure: DILATATION AND EVACUATION;  Surgeon: Tresia Fruit, MD;  Location: [facility];  Service: Gynecology;  Laterality: N/A;   TOTAL HIP ARTHROPLASTY     Patient Active Problem List   Diagnosis Date Noted   Prediabetes 09/14/2023   Vitamin D  deficiency 09/14/2023   Elevated cholesterol 09/14/2023   Mild intermittent allergic asthma without complication 05/04/2023   GERD (gastroesophageal reflux disease) 05/04/2023   Preoperative respiratory examination 05/04/2023   Atypical asthma ? VCD 08/19/2021   Cigarette smoker 08/19/2021   Tobacco use  disorder 01/23/2021   Chronic PID (chronic pelvic inflammatory disease) 07/07/2013   Right tubo-ovarian abscess 04/20/2013   Pelvic abscess in female 02/08/2013    PCP: Bolivar Bushman, PA-C  REFERRING PROVIDER: Colvin Dec, MD  REFERRING DIAG: M16.11 (ICD-10-CM) - Unilateral primary osteoarthritis, right hip Z47.1 (ICD-10-CM) - Aftercare following joint replacement surgery Z96.641 (ICD-10-CM) - Presence of right artificial hip joint  THERAPY DIAG:  Impaired functional mobility, balance, gait, and endurance  Decreased range of right hip movement  Muscle weakness (generalized)  Rationale for Evaluation and Treatment: Rehabilitation  ONSET DATE: 1 year for hip pain   SUBJECTIVE:   SUBJECTIVE STATEMENT: Pain scale 2/10 Rt hip.  Has apt with surgeon on 6/16 to discuss leg length.  Has began new rotation exercise at home.  Reports difficulty getting into car, feels she has to sidebend and manually move Rt LE.   PERTINENT HISTORY: R THA on 10/06/23 3 C-sections Total hysterectomy PAIN:  Are you having pain? Yes: NPRS scale: 0 Pain location: R hip joint, radiating to R knee, lateral aspect  Pain description: Dull, ache, numbness, felt random twinge this morning at incision  Aggravating factors: Walking, mobility Relieving factors: Medication, Ice some  PRECAUTIONS: Anterior hip  RED FLAGS: None   WEIGHT BEARING RESTRICTIONS: No, WBAT  FALLS:  Has patient  fallen in last 6 months? No  LIVING ENVIRONMENT: Lives with: lives with their family Stairs: Yes: External: 4 steps; on left going up Has following equipment at home: Single point cane and Walker - 2 wheeled  OCCUPATION: NA  PLOF: Independent  PATIENT GOALS: Be able to walk without RW, be normal, be able to lift, be able to get into car normal again  NEXT MD VISIT: 11/16/23  OBJECTIVE:  Note: Objective measures were completed at Evaluation unless otherwise noted.  DIAGNOSTIC FINDINGS:    PATIENT SURVEYS:  LEFS Lower Extremity Functional Score: 14 / 80 = 17.5 % 11/30/23: Lower Extremity Functional Score: 53 / 80 = 66.3 %   COGNITION: Overall cognitive status: Within functional limits for tasks assessed     SENSATION: Light touch: Impaired  Decreased on R greater trochanter/hip region as compared to L  EDEMA:  Mild edema on R Hip area   POSTURE: No Significant postural limitations  PALPATION: TTP general R hip region   LOWER EXTREMITY ROM:  Active ROM Right eval Left eval Right 11/12/23 Right  11/30/23  Hip flexion 50   AROM 100 PROM 110  Hip extension 0/to neutral  ~5, in prone ~10 in prone  Hip abduction   ~20 AROM (~40 PROM)   Hip adduction      Hip internal rotation   18  PROM 25 deg  Hip external rotation   5 PROM 20 deg  Knee flexion      Knee extension      Ankle dorsiflexion      Ankle plantarflexion      Ankle inversion      Ankle eversion       (Blank rows = not tested)  LOWER EXTREMITY MMT:  MMT Right eval Left eval Right  11/09/23 Right  11/30/23  Hip flexion 2+ 4-  3+/4-  Hip extension  3+/4- seated position  4- seated position  Hip abduction      Hip adduction      Hip internal rotation      Hip external rotation      Knee flexion   4- 4-  Knee extension   3+ 4-  Ankle dorsiflexion 5 5    Ankle plantarflexion      Ankle inversion      Ankle eversion       (Blank rows = not tested)   FUNCTIONAL TESTS:  5 times sit to stand: 30, use of BUE to push to stand  2 minute walk test: 90 ft, w/ RW, see gait details below  11/30/23: 5xSTS: 14, no UE use to push : 201', no AD, dec stance time on RLE, mild hiking/vaulting of LLE  GAIT: Distance walked: 90 ft Assistive device utilized: Environmental consultant - 2 wheeled Level of assistance: Modified independence Comments: Dec velocity, mild hiking with LLE 2/2 feeling RLE feeling longer, no unsteadiness or LOB to note, reports inc in pain with inc WB into RLE.  TREATMENT DATE:  12/17/23: Ambulation, 282ft, no AD Standing:  Marching  Hurdles 6in forward step over5RT inside // bars increased difficulty with 12 in  Hurdles 6in lateral step over 5RT inside // bars increased difficulty with 12 in  Vector stance 5x 5 with HHA  Forward step up 6in 1 then no HHA 15x  Step down 6in step 6in with HHA 15x  Squat front of chair 15x   12/13/23: Ambulation, 280ft x 2, no AD PROM, pt supine, into IR-ER-Flexion, to pt tolerance Supine active IR/ER stretching, 10x each direction Bridges, 2x10 SKTC/March, 2x10, RLE only, TA contraction  Prone, IR/ER, knee flexed, 2x10 each way, with mirror for visual feedback Sidestep, GTB around thigh, 20 ftx2 Monster walk, forward 20 ft, backward 47ft   12/02/23: Squat front of mat 10x Lunge onto 4in step 10x3 SLS Lt 40, Rt 16 Tandem stance 1x 30 on foam 2x 30 Toe tapping  7in step height 15x alternating Stairs: Reciprocal pattern 4in 3RT with HR descending  7in, shown how to complete wthout HR available with use of SPC, reciprocal pattern x1RT Sidestep 2RT GTB around thigh  11/30/23: Progress Note: LEFS MMT ROM 5xSTS     PATIENT EDUCATION:  Education details: PT evaluation, objective findings, POC, Importance of HEP, Precautions, Clinic policies  Person educated: Patient Education method: Explanation and Demonstration Education comprehension: verbalized understanding and returned demonstration  HOME EXERCISE PROGRAM: Access Code: [email or web address] URL: [email or web address]  Date: 11/02/2023 Prepared by: Virgia Griffins Powell-Butler Exercises - Supine Heel Slide with Strap  - 2 x daily - 7 x weekly - 3 sets - 10 reps - Supine Quad Set  - 2 x daily - 7 x weekly - 3 sets - 10 reps - 5 hold - Supine Gluteal Sets  - 2 x daily - 7 x weekly - 3 sets - 10 reps - 5 hold - Sit to Stand Without Arm Support  - 2  x daily - 7 x weekly - 3 sets - 10 reps -Daily walking, one lap around house ~ every 2 hours during day.   Date: 11/09/2023 Prepared by: Vickii Grand - Seated Long Arc Quad  - 2 x daily - 7 x weekly - 3 sets - 10 reps - 5 sec hold - Supine March  - 2 x daily - 7 x weekly - 3 sets - 10 reps - Supine Bridge  - 2 x daily - 7 x weekly - 3 sets - 10 reps -SLR AAROM-AROM 2X day 3X10   Exercises - Standing Hip Abduction with Counter Support  - 2 x daily - 7 x weekly - 3 sets - 10 reps - Standing March with Counter Support  - 2 x daily - 7 x weekly - 3 sets - 10 reps - Heel Raises with Counter Support  - 2 x daily - 7 x weekly - 3 sets - 10 reps  11/24/23: -Clam  12/02/23: Sidestep with resistance theraband  12/13/23: Exercises - Supine Hip Internal and External Rotation  - 2 x daily - 7 x weekly - 3 sets - 10 reps - Prone Alternating Bent Leg Fallout  - 2 x daily - 7 x weekly - 3 sets - 10 reps  12/17/23: -vector stance iwht HHA ASSESSMENT:  CLINICAL IMPRESSION: 12/17/23:  Session focus with LE strengthening and improving SLS to assist with functional movements.  Added hurdles with increased difficulty with 12in height due to hip flexion weakness and difficulty with SLS.  Pt presents with compensation and trunk  lateral flexion to address weakness.  Added vector stance for gluteal strengthening to improve SLS, HHA required to improve form.  No reports of pain through session, was limited by appropriate fatigue levels.       OBJECTIVE IMPAIRMENTS: Abnormal gait, decreased activity tolerance, decreased balance, decreased coordination, decreased endurance, decreased mobility, difficulty walking, decreased ROM, decreased strength, hypomobility, impaired flexibility, and pain.   ACTIVITY LIMITATIONS: bending, sitting, standing, squatting, stairs, transfers, and bed mobility  PARTICIPATION LIMITATIONS: cleaning, laundry, community activity, and yard work  [facility] POTENTIAL: Good  CLINICAL  DECISION MAKING: Stable/uncomplicated  EVALUATION COMPLEXITY: Low   GOALS: Goals reviewed with patient? No  SHORT TERM GOALS: Target date: 11/16/23 Patient will be independent with performance of HEP to demonstrate adequate self management of symptoms.  Baseline:  Goal status: MET  2.   Patient will report at least a 25% improvement with function or pain overall since beginning PT. Baseline: Reports 40% better on 11/30/23 Goal status: MET   LONG TERM GOALS: Target date: 12/28/23  Patient will improve LEFS score to at least 65/80 to demonstrate improved perceived function while meeting MCID.  Baseline: 53/80 on 11/30/23 Goal status: REVISED 11/30/23 2.  Patient will improve hip flex ROM to at least 120 degrees to show improved LE mobility for improve functional transfers and QOL. Baseline: 100 AROM, 110 PROM on 11/30/23 Goal status: IN PROGRESS 3. Patient will improve hip ext ROM to at least 15 degrees to show improved LE mobility to improve gait mechanics. Baseline: 10 DEG ON 11/30/23 Goal status: REVISED on 11/30/23 4. Patient will score at least 4- on hip flex MMT on RLE to show improved LE strength needed for ambulation without AD. Baseline:  Goal status: IN PROGRESS 4.  Patient will increase gait distance to 300 ft during test with least restrictive assistive device to demonstrate improved functional mobility walking household and community distances.  Baseline: 201' ON 11/2723 Goal status: REVISED ON 11/30/23   PLAN:  PT FREQUENCY: 1-2x/week  PT DURATION: 8 weeks  PLANNED INTERVENTIONS: 97164- PT Re-evaluation, 97110-Therapeutic exercises, 97530- Therapeutic activity, 97112- Neuromuscular re-education, 97535- Self Care, 69629- Manual therapy, [phone]- Gait training, Patient/Family education, Balance training, Stair training, Joint mobilization, Scar mobilization, DME instructions, and Moist heat  PLAN FOR NEXT SESSION: Progress LE mobility and strengthening within  protocol/pt tolerance  Minor Amble, LPTA/CLT; CBIS [phone]  1:41 PM, 12/17/23

## 2023-12-20 ENCOUNTER — Encounter (HOSPITAL_COMMUNITY)

## 2023-12-24 ENCOUNTER — Encounter (HOSPITAL_COMMUNITY): Payer: Self-pay

## 2023-12-24 ENCOUNTER — Ambulatory Visit (HOSPITAL_COMMUNITY)

## 2023-12-24 DIAGNOSIS — Z7409 Other reduced mobility: Secondary | ICD-10-CM

## 2023-12-24 DIAGNOSIS — M6281 Muscle weakness (generalized): Secondary | ICD-10-CM

## 2023-12-24 DIAGNOSIS — M25651 Stiffness of right hip, not elsewhere classified: Secondary | ICD-10-CM

## 2023-12-24 NOTE — Therapy (Signed)
 OUTPATIENT PHYSICAL THERAPY LOWER EXTREMITY TREATMENT   Patient Name: Erica [name] MRN: [id number] DOB:09-Jan-1976, 48 y.o., female Today's Date: 12/24/2023  END OF SESSION:  PT End of Session - 12/24/23 1105     Visit Number 11    Number of Visits 16    Date for PT Re-Evaluation 12/28/23    Authorization Type [place] Medicaid [employer] of     Authorization Time Period 16 visits approved 4/29-6/30    Authorization - Visit Number 10    Authorization - Number of Visits 16    Progress Note Due on Visit 16    PT Start Time 1105    PT Stop Time 1143    PT Time Calculation (min) 38 min    Activity Tolerance Patient tolerated treatment well    Behavior During Therapy WFL for tasks assessed/performed             Past Medical History:  Diagnosis Date   Acid reflux    Asthma    Back pain    Eczema    Heart burn    High blood pressure    Osteoarthritis    stage 4 right hip   Pelvic inflammatory disease    SOBOE (shortness of breath on exertion)    Tubo-ovarian abscess    Past Surgical History:  Procedure Laterality Date   ABDOMINAL HYSTERECTOMY N/A 07/07/2013   Procedure: HYSTERECTOMY ABDOMINAL with bilateral salpingo-oophorectomy;  Surgeon: Tresia Fruit, MD;  Location: [facility];  Service: Gynecology;  Laterality: N/A;   CESAREAN SECTION     DILATION AND EVACUATION N/A 02/03/2013   Procedure: DILATATION AND EVACUATION;  Surgeon: Tresia Fruit, MD;  Location: [facility];  Service: Gynecology;  Laterality: N/A;   TOTAL HIP ARTHROPLASTY     Patient Active Problem List   Diagnosis Date Noted   Prediabetes 09/14/2023   Vitamin D  deficiency 09/14/2023   Elevated cholesterol 09/14/2023   Mild intermittent allergic asthma without complication 05/04/2023   GERD (gastroesophageal reflux disease) 05/04/2023   Preoperative respiratory examination 05/04/2023   Atypical asthma ? VCD 08/19/2021   Cigarette smoker 08/19/2021   Tobacco use disorder 01/23/2021   Chronic PID  (chronic pelvic inflammatory disease) 07/07/2013   Right tubo-ovarian abscess 04/20/2013   Pelvic abscess in female 02/08/2013    PCP: Bolivar Bushman, PA-C  REFERRING PROVIDER: Colvin Dec, MD  REFERRING DIAG: M16.11 (ICD-10-CM) - Unilateral primary osteoarthritis, right hip Z47.1 (ICD-10-CM) - Aftercare following joint replacement surgery Z96.641 (ICD-10-CM) - Presence of right artificial hip joint  THERAPY DIAG:  Impaired functional mobility, balance, gait, and endurance  Decreased range of right hip movement  Muscle weakness (generalized)  Rationale for Evaluation and Treatment: Rehabilitation  ONSET DATE: 1 year for hip pain   SUBJECTIVE:   SUBJECTIVE STATEMENT: Pt does not report any pain at the beginning of therapy today. Still verbalizing concern for leg length discrepancy following hip surgery. Pt states surgeon says she could try a foot orthotic spacer to help compensate.   PERTINENT HISTORY: R THA on 10/06/23 3 C-sections Total hysterectomy PAIN:  Are you having pain? Yes: NPRS scale: 0 Pain location: R hip joint, radiating to R knee, lateral aspect  Pain description: Dull, ache, numbness, felt random twinge this morning at incision  Aggravating factors: Walking, mobility Relieving factors: Medication, Ice some  PRECAUTIONS: Anterior hip  RED FLAGS: None   WEIGHT BEARING RESTRICTIONS: No, WBAT  FALLS:  Has patient fallen in last 6 months? No  LIVING ENVIRONMENT:  Lives with: lives with their family Stairs: Yes: External: 4 steps; on left going up Has following equipment at home: Single point cane and Walker - 2 wheeled  OCCUPATION: NA  PLOF: Independent  PATIENT GOALS: Be able to walk without RW, be normal, be able to lift, be able to get into car normal again  NEXT MD VISIT: 11/16/23  OBJECTIVE:  Note: Objective measures were completed at Evaluation unless otherwise noted.  DIAGNOSTIC FINDINGS:   PATIENT SURVEYS:  LEFS Lower  Extremity Functional Score: 14 / 80 = 17.5 % 11/30/23: Lower Extremity Functional Score: 53 / 80 = 66.3 %   COGNITION: Overall cognitive status: Within functional limits for tasks assessed     SENSATION: Light touch: Impaired  Decreased on R greater trochanter/hip region as compared to L  EDEMA:  Mild edema on R Hip area   POSTURE: No Significant postural limitations  PALPATION: TTP general R hip region   LOWER EXTREMITY ROM:  Active ROM Right eval Left eval Right 11/12/23 Right  11/30/23  Hip flexion 50   AROM 100 PROM 110  Hip extension 0/to neutral  ~5, in prone ~10 in prone  Hip abduction   ~20 AROM (~40 PROM)   Hip adduction      Hip internal rotation   18  PROM 25 deg  Hip external rotation   5 PROM 20 deg  Knee flexion      Knee extension      Ankle dorsiflexion      Ankle plantarflexion      Ankle inversion      Ankle eversion       (Blank rows = not tested)  LOWER EXTREMITY MMT:  MMT Right eval Left eval Right  11/09/23 Right  11/30/23  Hip flexion 2+ 4-  3+/4-  Hip extension  3+/4- seated position  4- seated position  Hip abduction      Hip adduction      Hip internal rotation      Hip external rotation      Knee flexion   4- 4-  Knee extension   3+ 4-  Ankle dorsiflexion 5 5    Ankle plantarflexion      Ankle inversion      Ankle eversion       (Blank rows = not tested)   FUNCTIONAL TESTS:  5 times sit to stand: 30, use of BUE to push to stand  2 minute walk test: 90 ft, w/ RW, see gait details below  11/30/23: 5xSTS: 14, no UE use to push : 201', no AD, dec stance time on RLE, mild hiking/vaulting of LLE  GAIT: Distance walked: 90 ft Assistive device utilized: Environmental consultant - 2 wheeled Level of assistance: Modified independence Comments: Dec velocity, mild hiking with LLE 2/2 feeling RLE feeling longer, no unsteadiness or LOB to note, reports inc in pain with inc WB into RLE.  TREATMENT DATE:  12/24/2023  Therapeutic Exercise: -Supine bridges 2 sets of 10 reps, 3 second holds, pt cued for max hip extension, second set with GTB at knees -SLR, 1 set of 10 reps -DKTC on green exercise ball, 2 sets of 10 reps, pt cued for controlled motion -Treadmill, 5 minutes, grade 2 incline, 1.0 for first 3 minutes, 1.3 for last 2 minutes -Step ups, 8 inch, 1 sets of 8 reps, pt cued for only one UE use -Lateral step up and overs, 8 inch steps, 1 sets of 8 reps -Hip hikes on 4 inch step, RLE only, 2 sets of 8 reps -Walking marches, with 3 lb weights, 1 lap, 40 feet -Lateral stepping with 3 lb weights, 1 lap, 40 feet -Sit to stands, 2 sets of 3 reps, throughout session  12/17/23: Ambulation, 21ft, no AD Standing:  Marching  Hurdles 6in forward step over5RT inside // bars increased difficulty with 12 in  Hurdles 6in lateral step over 5RT inside // bars increased difficulty with 12 in  Vector stance 5x 5 with HHA  Forward step up 6in 1 then no HHA 15x  Step down 6in step 6in with HHA 15x  Squat front of chair 15x   12/13/23: Ambulation, 211ft x 2, no AD PROM, pt supine, into IR-ER-Flexion, to pt tolerance Supine active IR/ER stretching, 10x each direction Bridges, 2x10 SKTC/March, 2x10, RLE only, TA contraction  Prone, IR/ER, knee flexed, 2x10 each way, with mirror for visual feedback Sidestep, GTB around thigh, 20 ftx2 Monster walk, forward 20 ft, backward 10ft    PATIENT EDUCATION:  Education details: PT evaluation, objective findings, POC, Importance of HEP, Precautions, Clinic policies  Person educated: Patient Education method: Explanation and Demonstration Education comprehension: verbalized understanding and returned demonstration  HOME EXERCISE PROGRAM: Access Code: [email or web address] URL: [email or web address]  Date: 11/02/2023 Prepared by: Virgia Griffins Powell-Butler Exercises - Supine Heel  Slide with Strap  - 2 x daily - 7 x weekly - 3 sets - 10 reps - Supine Quad Set  - 2 x daily - 7 x weekly - 3 sets - 10 reps - 5 hold - Supine Gluteal Sets  - 2 x daily - 7 x weekly - 3 sets - 10 reps - 5 hold - Sit to Stand Without Arm Support  - 2 x daily - 7 x weekly - 3 sets - 10 reps -Daily walking, one lap around house ~ every 2 hours during day.   Date: 11/09/2023 Prepared by: Vickii Grand - Seated Long Arc Quad  - 2 x daily - 7 x weekly - 3 sets - 10 reps - 5 sec hold - Supine March  - 2 x daily - 7 x weekly - 3 sets - 10 reps - Supine Bridge  - 2 x daily - 7 x weekly - 3 sets - 10 reps -SLR AAROM-AROM 2X day 3X10   Exercises - Standing Hip Abduction with Counter Support  - 2 x daily - 7 x weekly - 3 sets - 10 reps - Standing March with Counter Support  - 2 x daily - 7 x weekly - 3 sets - 10 reps - Heel Raises with Counter Support  - 2 x daily - 7 x weekly - 3 sets - 10 reps  11/24/23: -Clam  12/02/23: Sidestep with resistance theraband  12/13/23: Exercises - Supine Hip Internal and External Rotation  - 2 x daily - 7 x weekly - 3 sets - 10 reps - Prone Alternating Bent Leg Fallout  -  2 x daily - 7 x weekly - 3 sets - 10 reps  12/17/23: -vector stance iwht HHA (Pt reports this has been bothering her at home and she has not been able to do it. 12/24/23) ASSESSMENT:  CLINICAL IMPRESSION: Patient continues to demonstrate decreased RLE strength, decreased R hip ROM, decreased gait quality and balance. Patient also demonstrates difficulty with hip hike exercise during today's session. Patient able to progress dynamic balance and core activation exercises today with step up variations and ankle weight marches, good performance with verbal cueing. Patient would continue to benefit from skilled physical therapy for increased endurance with ambulation, increased RLE strength, and improved balance for improved quality of life, improved community ambulation and continued progress towards  therapy goals.    12/17/23:  Session focus with LE strengthening and improving SLS to assist with functional movements.  Added hurdles with increased difficulty with 12in height due to hip flexion weakness and difficulty with SLS.  Pt presents with compensation and trunk lateral flexion to address weakness.  Added vector stance for gluteal strengthening to improve SLS, HHA required to improve form.  No reports of pain through session, was limited by appropriate fatigue levels.       OBJECTIVE IMPAIRMENTS: Abnormal gait, decreased activity tolerance, decreased balance, decreased coordination, decreased endurance, decreased mobility, difficulty walking, decreased ROM, decreased strength, hypomobility, impaired flexibility, and pain.   ACTIVITY LIMITATIONS: bending, sitting, standing, squatting, stairs, transfers, and bed mobility  PARTICIPATION LIMITATIONS: cleaning, laundry, community activity, and yard work  [facility] POTENTIAL: Good  CLINICAL DECISION MAKING: Stable/uncomplicated  EVALUATION COMPLEXITY: Low   GOALS: Goals reviewed with patient? No  SHORT TERM GOALS: Target date: 11/16/23 Patient will be independent with performance of HEP to demonstrate adequate self management of symptoms.  Baseline:  Goal status: MET  2.   Patient will report at least a 25% improvement with function or pain overall since beginning PT. Baseline: Reports 40% better on 11/30/23 Goal status: MET   LONG TERM GOALS: Target date: 12/28/23  Patient will improve LEFS score to at least 65/80 to demonstrate improved perceived function while meeting MCID.  Baseline: 53/80 on 11/30/23 Goal status: REVISED 11/30/23 2.  Patient will improve hip flex ROM to at least 120 degrees to show improved LE mobility for improve functional transfers and QOL. Baseline: 100 AROM, 110 PROM on 11/30/23 Goal status: IN PROGRESS 3. Patient will improve hip ext ROM to at least 15 degrees to show improved LE mobility to improve gait  mechanics. Baseline: 10 DEG ON 11/30/23 Goal status: REVISED on 11/30/23 4. Patient will score at least 4- on hip flex MMT on RLE to show improved LE strength needed for ambulation without AD. Baseline:  Goal status: IN PROGRESS 4.  Patient will increase gait distance to 300 ft during test with least restrictive assistive device to demonstrate improved functional mobility walking household and community distances.  Baseline: 201' ON 11/2723 Goal status: REVISED ON 11/30/23   PLAN:  PT FREQUENCY: 1-2x/week  PT DURATION: 8 weeks  PLANNED INTERVENTIONS: 97164- PT Re-evaluation, 97110-Therapeutic exercises, 97530- Therapeutic activity, 97112- Neuromuscular re-education, 97535- Self Care, 62952- Manual therapy, [phone]- Gait training, Patient/Family education, Balance training, Stair training, Joint mobilization, Scar mobilization, DME instructions, and Moist heat  PLAN FOR NEXT SESSION: Progress LE mobility and strengthening within protocol/pt tolerance, reassess 6/27  Armond Bertin, PT, DPT [facility] Office: [phone] 11:56 AM, 12/24/23

## 2023-12-27 ENCOUNTER — Encounter (HOSPITAL_COMMUNITY)

## 2023-12-28 ENCOUNTER — Ambulatory Visit (INDEPENDENT_AMBULATORY_CARE_PROVIDER_SITE_OTHER): Admitting: Bariatrics

## 2023-12-28 ENCOUNTER — Encounter: Payer: Self-pay | Admitting: Bariatrics

## 2023-12-28 VITALS — BP 115/81 | HR 90 | Temp 98.3°F | Ht 60.5 in | Wt 183.0 lb

## 2023-12-28 DIAGNOSIS — E559 Vitamin D deficiency, unspecified: Secondary | ICD-10-CM | POA: Diagnosis not present

## 2023-12-28 DIAGNOSIS — F5089 Other specified eating disorder: Secondary | ICD-10-CM

## 2023-12-28 DIAGNOSIS — E66812 Obesity, class 2: Secondary | ICD-10-CM

## 2023-12-28 DIAGNOSIS — R632 Polyphagia: Secondary | ICD-10-CM

## 2023-12-28 DIAGNOSIS — E6609 Other obesity due to excess calories: Secondary | ICD-10-CM

## 2023-12-28 DIAGNOSIS — Z6835 Body mass index (BMI) 35.0-35.9, adult: Secondary | ICD-10-CM

## 2023-12-28 MED ORDER — TOPIRAMATE 50 MG PO TABS: 50.0000 mg | ORAL_TABLET | Freq: Every day | ORAL | 0 refills | Status: DC

## 2023-12-28 MED ORDER — VITAMIN D (ERGOCALCIFEROL) 1.25 MG (50000 UNIT) PO CAPS: 50000.0000 [IU] | ORAL_CAPSULE | ORAL | 0 refills | Status: DC

## 2023-12-28 MED ORDER — WEGOVY 1 MG/0.5ML ~~LOC~~ SOAJ: 1.0000 mg | SUBCUTANEOUS | 0 refills | Status: DC

## 2023-12-28 NOTE — Progress Notes (Signed)
 WEIGHT SUMMARY AND BIOMETRICS  Weight Lost Since Last Visit: 2lb  Weight Gained Since Last Visit: 0   Vitals Temp: 98.3 F (36.8 C) BP: 115/81 Pulse Rate: 90 SpO2: 100 %   Anthropometric Measurements Height: 5' 0.5 (1.537 m) Weight: 183 lb (83 kg) BMI (Calculated): 35.14 Weight at Last Visit: 185lb Weight Lost Since Last Visit: 2lb Weight Gained Since Last Visit: 0 Starting Weight: 201lb Total Weight Loss (lbs): 18 lb (8.165 kg)   Body Composition  Body Fat %: 45.7 % Fat Mass (lbs): 83.6 lbs Muscle Mass (lbs): 84.4 lbs Total Body Water (lbs): 73.2 lbs Visceral Fat Rating : 11   Other Clinical Data Fasting: no Labs: no Today's Visit #: 6 Starting Date: 09/13/23    OBESITY Erica [name] is here to discuss her progress with her obesity treatment plan along with follow-up of her obesity related diagnoses.    Nutrition Plan: the Category 2 plan - 70% adherence.  Current exercise: walking and physical therapy  Interim History:  She is down 2 lbs since her last visit.  Eating all of the food on the plan., Protein intake is as prescribed, Is exceeding snack calorie allotment, and Water intake is adequate.   Pharmacotherapy: Erica [name] is on Wegovy  0.50 mg SQ weekly Adverse side effects: None Hunger is moderately controlled.  Cravings are poorly controlled.  Assessment/Plan:   Vitamin D  Deficiency Vitamin D  is not at goal of 50.  Most recent vitamin D  level was 8.1. She is on  prescription ergocalciferol  50,000 IU weekly. Lab Results  Component Value Date   VD25OH 8.1 (L) 09/13/2023    Plan: Refill prescription vitamin D  50,000 IU weekly.   Polyphagia Erica [name] endorses excessive hunger.  Medication(s): Wegovy  0.5 mg  Effects of medication (appetite):  moderately controlled. Cravings are poorly controlled.   Plan: Medication(s): Wegovy   1.0 mg SQ weekly, increased from Wegovy  0.5 mg SQ weekly. Will increase water, protein and fiber to help assuage hunger.  Will minimize foods that have a high glucose index/load to minimize reactive hypoglycemia.  She will continue to use weights with her PT instructor and will add an arm weights to increase her muscle mass.  Eating disorder/emotional eating Erica [name] has had issues with stress eating, emotional eating, nighttime eating, and boredom eating. Currently this is poorly controlled. Overall mood is stable. Denies suicidal/homicidal ideation. Medication(s): none  Plan:  Motivational interviewing as well as evidence-based interventions for health behavior change were utilized today including the discussion of self monitoring techniques, problem-solving barriers and SMART goal setting techniques.  Rx: Topamax 50 mg 1 p.o. with supper #30 with no refills. Discussed distractions to curb eating behaviors. Discussed activities to do with one's hands in the evening  Be sure to get adequate rest as lack of rest can trigger appetite.  Have plan in place for stressful events.  Consider other rewards besides food.  Generalized Obesity: Current BMI BMI (Calculated): 35.14   Pharmacotherapy Plan Continue and increase dose  Wegovy  1.0 mg SQ weekly  Erica [name] is currently in the action stage of change. As such, her goal is to continue with weight loss efforts.  She has agreed to the Category 2 plan.  Exercise goals: All adults should avoid inactivity. Some physical activity is better than none, and adults who participate in any amount of physical activity gain some health benefits.   Behavioral modification strategies: increasing lean protein intake, no meal skipping, decrease eating out, meal planning , increase water intake, better snacking choices, planning for success, increasing vegetables, keep healthy foods in the home, weigh protein portions, measure portion sizes, and mindful  eating.  Erica [name] has agreed to follow-up with our clinic in 4 weeks.    Objective:   VITALS: Per patient if applicable, see vitals. GENERAL: Alert and in no acute distress. CARDIOPULMONARY: No increased WOB. Speaking in clear sentences.  PSYCH: Pleasant and cooperative. Speech normal rate and rhythm. Affect is appropriate. Insight and judgement are appropriate. Attention is focused, linear, and appropriate.  NEURO: Oriented as arrived to appointment on time with no prompting.   Attestation Statements:   This was prepared with the assistance of Engineer, civil (consulting).  Occasional wrong-word or sound-a-like substitutions may have occurred due to the inherent limitations of voice recognition   Clayborne Daring, DO

## 2023-12-31 ENCOUNTER — Encounter (HOSPITAL_COMMUNITY)

## 2024-01-03 ENCOUNTER — Ambulatory Visit (HOSPITAL_COMMUNITY)

## 2024-01-03 DIAGNOSIS — M6281 Muscle weakness (generalized): Secondary | ICD-10-CM

## 2024-01-03 DIAGNOSIS — M25651 Stiffness of right hip, not elsewhere classified: Secondary | ICD-10-CM

## 2024-01-03 DIAGNOSIS — Z7409 Other reduced mobility: Secondary | ICD-10-CM

## 2024-01-03 NOTE — Therapy (Signed)
 OUTPATIENT PHYSICAL THERAPY LOWER EXTREMITY TREATMENT/PROGRESS NOTE   Patient Name: Erica [name] MRN: [id number] DOB:Jun 21, 1976, 48 y.o., female Today's Date: 01/03/2024  Progress Note Reporting Period 11/30/23 to 01/03/24  See note below for Objective Data and Assessment of Progress/Goals.       END OF SESSION:  PT End of Session - 01/03/24 1109     Visit Number 12    Number of Visits 16    Date for PT Re-Evaluation 01/31/24    Authorization Type [place] Medicaid [employer] [place]    Authorization Time Period 16 visits approved 4/29-6/30    Authorization - Number of Visits 16    Progress Note Due on Visit 16    PT Start Time 1107    PT Stop Time 1145    PT Time Calculation (min) 38 min    Activity Tolerance Patient tolerated treatment well    Behavior During Therapy WFL for tasks assessed/performed             Past Medical History:  Diagnosis Date   Acid reflux    Asthma    Back pain    Eczema    Heart burn    High blood pressure    Osteoarthritis    stage 4 right hip   Pelvic inflammatory disease    SOBOE (shortness of breath on exertion)    Tubo-ovarian abscess    Past Surgical History:  Procedure Laterality Date   ABDOMINAL HYSTERECTOMY N/A 07/07/2013   Procedure: HYSTERECTOMY ABDOMINAL with bilateral salpingo-oophorectomy;  Surgeon: Agent KANDICE Solomons, MD;  Location: [facility];  Service: Gynecology;  Laterality: N/A;   CESAREAN SECTION     DILATION AND EVACUATION N/A 02/03/2013   Procedure: DILATATION AND EVACUATION;  Surgeon: Agent KANDICE Solomons, MD;  Location: [facility];  Service: Gynecology;  Laterality: N/A;   TOTAL HIP ARTHROPLASTY     Patient Active Problem List   Diagnosis Date Noted   Prediabetes 09/14/2023   Vitamin D  deficiency 09/14/2023   Elevated cholesterol 09/14/2023   Mild intermittent allergic asthma without complication 05/04/2023   GERD (gastroesophageal reflux disease) 05/04/2023   Preoperative respiratory examination 05/04/2023    Atypical asthma ? VCD 08/19/2021   Cigarette smoker 08/19/2021   Tobacco use disorder 01/23/2021   Chronic PID (chronic pelvic inflammatory disease) 07/07/2013   Right tubo-ovarian abscess 04/20/2013   Pelvic abscess in female 02/08/2013    PCP: Catharine Ethelene BIRCH, PA-C  REFERRING PROVIDER: Georgina Elgin Isela DOUGLAS, MD  REFERRING DIAG: M16.11 (ICD-10-CM) - Unilateral primary osteoarthritis, right hip Z47.1 (ICD-10-CM) - Aftercare following joint replacement surgery Z96.641 (ICD-10-CM) - Presence of right artificial hip joint  THERAPY DIAG:  Impaired functional mobility, balance, gait, and endurance  Decreased range of right hip movement  Muscle weakness (generalized)  Rationale for Evaluation and Treatment: Rehabilitation  ONSET DATE: 1 year for hip pain   SUBJECTIVE:   SUBJECTIVE STATEMENT: Pt reports she went back to work per doctor approval and was very sore the next date. But better now. Patient reports doctor confirmed there was a leg length discrepancy. Looking into adjustable heel lifts on [employer].   PERTINENT HISTORY: R THA on 10/06/23 3 C-sections Total hysterectomy PAIN:  Are you having pain? Yes: NPRS scale: 0 Pain location: R hip joint, radiating to R knee, lateral aspect  Pain description: Dull, ache, numbness, felt random twinge this morning at incision  Aggravating factors: Walking, mobility Relieving factors: Medication, Ice some  PRECAUTIONS: Anterior hip  RED FLAGS: None   WEIGHT  BEARING RESTRICTIONS: No, WBAT  FALLS:  Has patient fallen in last 6 months? No  LIVING ENVIRONMENT: Lives with: lives with their family Stairs: Yes: External: 4 steps; on left going up Has following equipment at home: Single point cane and Walker - 2 wheeled  OCCUPATION: NA  PLOF: Independent  PATIENT GOALS: Be able to walk without RW, be normal, be able to lift, be able to get into car normal again  NEXT MD VISIT: 11/16/23  OBJECTIVE:  Note: Objective measures  were completed at Evaluation unless otherwise noted.  DIAGNOSTIC FINDINGS:   PATIENT SURVEYS:  LEFS Lower Extremity Functional Score: 14 / 80 = 17.5 % 11/30/23: Lower Extremity Functional Score: 53 / 80 = 66.3 %  01/03/24: Lower Extremity Functional Score: 52 / 80 = 65.0 %  COGNITION: Overall cognitive status: Within functional limits for tasks assessed     SENSATION: Light touch: Impaired  Decreased on R greater trochanter/hip region as compared to L  EDEMA:  Mild edema on R Hip area   POSTURE: No Significant postural limitations  PALPATION: TTP general R hip region   LOWER EXTREMITY ROM:  Active ROM Right eval Left eval Right 11/12/23 Right  11/30/23 Right  01/03/24  Hip flexion 50   AROM 100 PROM 110 105 AROM  Hip extension 0/to neutral  ~5, in prone ~10 in prone ~10  Hip abduction   ~20 AROM (~40 PROM)  27  Hip adduction       Hip internal rotation   18  PROM 25 deg 24 AROM  Hip external rotation   5 PROM 20 deg 15 AROM   Knee flexion       Knee extension       Ankle dorsiflexion       Ankle plantarflexion       Ankle inversion       Ankle eversion        (Blank rows = not tested)  LOWER EXTREMITY MMT:  MMT Right eval Left eval Right  11/09/23 Right  11/30/23 Right 01/03/24  Hip flexion 2+ 4-  3+/4- 4  Hip extension  3+/4- seated position  4- seated position 4  Hip abduction     4-  Hip adduction     4-  Hip internal rotation       Hip external rotation       Knee flexion   4- 4- 4-  Knee extension   3+ 4- 4-  Ankle dorsiflexion 5 5     Ankle plantarflexion       Ankle inversion       Ankle eversion        (Blank rows = not tested)   FUNCTIONAL TESTS:  5 times sit to stand: 30, use of BUE to push to stand  2 minute walk test: 90 ft, w/ RW, see gait details below  11/30/23: 5xSTS: 14, no UE use to push : 201', no AD, dec stance time on RLE, mild hiking/vaulting of LLE  01/03/24: 5xSTS: 11 : 305', no AD  GAIT: Distance walked: 90  ft Assistive device utilized: Environmental consultant - 2 wheeled Level of assistance: Modified independence Comments: Dec velocity, mild hiking with LLE 2/2 feeling RLE feeling longer, no unsteadiness or LOB to note, reports inc in pain with inc WB into RLE.  TREATMENT DATE:  01/03/24: Progress Note: LEFS R hip ROM R hip MMT 5 Times Sit to Stand 2 minute walk test Discussion on POC   12/24/2023  Therapeutic Exercise: -Supine bridges 2 sets of 10 reps, 3 second holds, pt cued for max hip extension, second set with GTB at knees -SLR, 1 set of 10 reps -DKTC on green exercise ball, 2 sets of 10 reps, pt cued for controlled motion -Treadmill, 5 minutes, grade 2 incline, 1.0 for first 3 minutes, 1.3 for last 2 minutes -Step ups, 8 inch, 1 sets of 8 reps, pt cued for only one UE use -Lateral step up and overs, 8 inch steps, 1 sets of 8 reps -Hip hikes on 4 inch step, RLE only, 2 sets of 8 reps -Walking marches, with 3 lb weights, 1 lap, 40 feet -Lateral stepping with 3 lb weights, 1 lap, 40 feet -Sit to stands, 2 sets of 3 reps, throughout session  12/17/23: Ambulation, 233ft, no AD Standing:  Marching  Hurdles 6in forward step over5RT inside // bars increased difficulty with 12 in  Hurdles 6in lateral step over 5RT inside // bars increased difficulty with 12 in  Vector stance 5x 5 with HHA  Forward step up 6in 1 then no HHA 15x  Step down 6in step 6in with HHA 15x  Squat front of chair 15x     PATIENT EDUCATION:  Education details: PT evaluation, objective findings, POC, Importance of HEP, Precautions, Clinic policies  Person educated: Patient Education method: Explanation and Demonstration Education comprehension: verbalized understanding and returned demonstration  HOME EXERCISE PROGRAM: Access Code: [email or web address] URL: [email or web address]  Date:  11/02/2023 Prepared by: Rosaria Powell-Butler Exercises - Supine Heel Slide with Strap  - 2 x daily - 7 x weekly - 3 sets - 10 reps - Supine Quad Set  - 2 x daily - 7 x weekly - 3 sets - 10 reps - 5 hold - Supine Gluteal Sets  - 2 x daily - 7 x weekly - 3 sets - 10 reps - 5 hold - Sit to Stand Without Arm Support  - 2 x daily - 7 x weekly - 3 sets - 10 reps -Daily walking, one lap around house ~ every 2 hours during day.   Date: 11/09/2023 Prepared by: Greig Fuse - Seated Long Arc Quad  - 2 x daily - 7 x weekly - 3 sets - 10 reps - 5 sec hold - Supine March  - 2 x daily - 7 x weekly - 3 sets - 10 reps - Supine Bridge  - 2 x daily - 7 x weekly - 3 sets - 10 reps -SLR AAROM-AROM 2X day 3X10   Exercises - Standing Hip Abduction with Counter Support  - 2 x daily - 7 x weekly - 3 sets - 10 reps - Standing March with Counter Support  - 2 x daily - 7 x weekly - 3 sets - 10 reps - Heel Raises with Counter Support  - 2 x daily - 7 x weekly - 3 sets - 10 reps  11/24/23: -Clam  12/02/23: Sidestep with resistance theraband  12/13/23: Exercises - Supine Hip Internal and External Rotation  - 2 x daily - 7 x weekly - 3 sets - 10 reps - Prone Alternating Bent Leg Fallout  - 2 x daily - 7 x weekly - 3 sets - 10 reps  12/17/23: -vector stance iwht HHA (Pt reports this has been bothering her at home and she has not been  able to do it. 12/24/23)   ASSESSMENT:  CLINICAL IMPRESSION: Progress note performed on this date. Patient demonstrating good progress with R hip ROM and strength gains this date. Improvements with 5 times sit to stand test and 2 minute walk test further demonstrating improved endurance and strength/power. Patient continues to express concern with leg length discrepancy and reports increased soreness following return to work. Discussion on purchasing adjustable heel lifts from [employer] prior to next PT session. Patient would continue to benefit from skilled physical therapy for increased  endurance with ambulation, increased RLE strength, and improved balance for improved quality of life, improved community ambulation and continued progress towards therapy goals.    OBJECTIVE IMPAIRMENTS: Abnormal gait, decreased activity tolerance, decreased balance, decreased coordination, decreased endurance, decreased mobility, difficulty walking, decreased ROM, decreased strength, hypomobility, impaired flexibility, and pain.   ACTIVITY LIMITATIONS: bending, sitting, standing, squatting, stairs, transfers, and bed mobility  PARTICIPATION LIMITATIONS: cleaning, laundry, community activity, and yard work  [facility] POTENTIAL: Good  CLINICAL DECISION MAKING: Stable/uncomplicated  EVALUATION COMPLEXITY: Low   GOALS: Goals reviewed with patient? No  SHORT TERM GOALS: Target date: 11/16/23 Patient will be independent with performance of HEP to demonstrate adequate self management of symptoms.  Baseline:  Goal status: MET  2.   Patient will report at least a 25% improvement with function or pain overall since beginning PT. Baseline: Reports 40% better on 11/30/23 Goal status: MET   LONG TERM GOALS: Target date: 01/28/24  Patient will improve LEFS score to at least 65/80 to demonstrate improved perceived function while meeting MCID.  Baseline: 53/80 on 11/30/23 Goal status: REVISED 11/30/23 2.  Patient will improve hip flex ROM to at least 120 degrees to show improved LE mobility for improve functional transfers and QOL. Baseline: 100 AROM, 110 PROM on 11/30/23 Goal status: IN PROGRESS 3. Patient will improve hip ext ROM to at least 15 degrees to show improved LE mobility to improve gait mechanics. Baseline: 10 DEG ON 11/30/23 Goal status: REVISED on 11/30/23 4. Patient will score at least 4- on hip flex MMT on RLE to show improved LE strength needed for ambulation without AD. Baseline:  Goal status: MET 4.  Patient will increase gait distance to 350 ft during test with least  restrictive assistive device to demonstrate improved functional mobility walking household and community distances.  Baseline: 305' ON 11/2723 Goal status: REVISED ON 01/03/24   PLAN:  PT FREQUENCY: 1-2x/week  PT DURATION: 8 weeks  PLANNED INTERVENTIONS: [id number]- PT Re-evaluation, 97110-Therapeutic exercises, 97530- Therapeutic activity, 97112- Neuromuscular re-education, 97535- Self Care, [id number]- Manual therapy, [phone]- Gait training, Patient/Family education, Balance training, Stair training, Joint mobilization, Scar mobilization, DME instructions, and Moist heat  PLAN FOR NEXT SESSION: Progress LE mobility and strengthening within protocol/pt tolerance, functional/body mechanics with lifting/squatting for return to work, balance   12:07 PM, 01/03/24 Rosaria Settler, PT, DPT [facility] [place]

## 2024-01-05 ENCOUNTER — Encounter (HOSPITAL_COMMUNITY): Admitting: Physical Therapy

## 2024-01-05 ENCOUNTER — Telehealth (HOSPITAL_COMMUNITY): Payer: Self-pay | Admitting: Physical Therapy

## 2024-01-05 NOTE — Telephone Encounter (Signed)
 2nd no show :  Left message that pt next appointment is Thursday July 17th  at 11:00  [place], PT CLT [phone]

## 2024-01-13 ENCOUNTER — Other Ambulatory Visit (HOSPITAL_COMMUNITY): Payer: Self-pay | Admitting: Orthopedic Surgery

## 2024-01-13 DIAGNOSIS — M545 Low back pain, unspecified: Secondary | ICD-10-CM

## 2024-01-18 ENCOUNTER — Ambulatory Visit (HOSPITAL_COMMUNITY)

## 2024-01-18 ENCOUNTER — Encounter (HOSPITAL_COMMUNITY): Payer: Self-pay

## 2024-01-20 ENCOUNTER — Encounter (HOSPITAL_COMMUNITY): Payer: Self-pay

## 2024-01-20 ENCOUNTER — Ambulatory Visit (HOSPITAL_COMMUNITY): Attending: Orthopedic Surgery

## 2024-01-20 DIAGNOSIS — M6281 Muscle weakness (generalized): Secondary | ICD-10-CM | POA: Insufficient documentation

## 2024-01-20 DIAGNOSIS — Z7409 Other reduced mobility: Secondary | ICD-10-CM | POA: Diagnosis present

## 2024-01-20 DIAGNOSIS — M25651 Stiffness of right hip, not elsewhere classified: Secondary | ICD-10-CM | POA: Diagnosis present

## 2024-01-20 NOTE — Therapy (Signed)
 OUTPATIENT PHYSICAL THERAPY LOWER EXTREMITY TREATMENT   Patient Name: Erica [name] MRN: [id number] DOB:04-Jan-1976, 48 y.o., female Today's Date: 01/20/2024   END OF SESSION:  PT End of Session - 01/20/24 1106     Visit Number 13    Number of Visits 16    Date for PT Re-Evaluation 01/31/24    Authorization Type [place] [employer] [place]    Authorization Time Period 16 visits approved 4/29-6/30    Authorization - Visit Number 11    Authorization - Number of Visits 16    Progress Note Due on Visit 16    PT Start Time 1106    PT Stop Time 1145    PT Time Calculation (min) 39 min    Activity Tolerance Patient tolerated treatment well    Behavior During Therapy WFL for tasks assessed/performed             Past Medical History:  Diagnosis Date   Acid reflux    Asthma    Back pain    Eczema    Heart burn    High blood pressure    Osteoarthritis    stage 4 right hip   Pelvic inflammatory disease    SOBOE (shortness of breath on exertion)    Tubo-ovarian abscess    Past Surgical History:  Procedure Laterality Date   ABDOMINAL HYSTERECTOMY N/A 07/07/2013   Procedure: HYSTERECTOMY ABDOMINAL with bilateral salpingo-oophorectomy;  Surgeon: Agent KANDICE Solomons, MD;  Location: [facility];  Service: Gynecology;  Laterality: N/A;   CESAREAN SECTION     DILATION AND EVACUATION N/A 02/03/2013   Procedure: DILATATION AND EVACUATION;  Surgeon: Agent KANDICE Solomons, MD;  Location: [facility];  Service: Gynecology;  Laterality: N/A;   TOTAL HIP ARTHROPLASTY     Patient Active Problem List   Diagnosis Date Noted   Prediabetes 09/14/2023   Vitamin D  deficiency 09/14/2023   Elevated cholesterol 09/14/2023   Mild intermittent allergic asthma without complication 05/04/2023   GERD (gastroesophageal reflux disease) 05/04/2023   Preoperative respiratory examination 05/04/2023   Atypical asthma ? VCD 08/19/2021   Cigarette smoker 08/19/2021   Tobacco use disorder 01/23/2021   Chronic  PID (chronic pelvic inflammatory disease) 07/07/2013   Right tubo-ovarian abscess 04/20/2013   Pelvic abscess in female 02/08/2013    PCP: Catharine Ethelene BIRCH, PA-C  REFERRING PROVIDER: Georgina Elgin Isela DOUGLAS, MD  REFERRING DIAG: M16.11 (ICD-10-CM) - Unilateral primary osteoarthritis, right hip Z47.1 (ICD-10-CM) - Aftercare following joint replacement surgery Z96.641 (ICD-10-CM) - Presence of right artificial hip joint  THERAPY DIAG:  Impaired functional mobility, balance, gait, and endurance  Decreased range of right hip movement  Muscle weakness (generalized)  Rationale for Evaluation and Treatment: Rehabilitation  ONSET DATE: 1 year for hip pain   SUBJECTIVE:   SUBJECTIVE STATEMENT: Reports constant pain in lower back L4-L5, has MRI scheduled for end of July.  No reports of hip pain, does have some radicular pain lateral hip burning to knee.  Has tried heel lifts but doesn't like how fits in shoes, going to [facility] for heel support of shoes in early August.    PERTINENT HISTORY: R THA on 10/06/23 3 C-sections Total hysterectomy PAIN:  Are you having pain? Yes: NPRS scale: 0/10 Rt hip, 6/10 LBP.  Pain location: R hip joint, radiating to R knee, lateral aspect  Pain description: Dull, ache, numbness, felt random twinge this morning at incision  Aggravating factors: Walking, mobility Relieving factors: Medication, Ice some  PRECAUTIONS: Anterior hip  RED FLAGS: None   WEIGHT BEARING RESTRICTIONS: No, WBAT  FALLS:  Has patient fallen in last 6 months? No  LIVING ENVIRONMENT: Lives with: lives with their family Stairs: Yes: External: 4 steps; on left going up Has following equipment at home: Single point cane and Walker - 2 wheeled  OCCUPATION: NA  PLOF: Independent  PATIENT GOALS: Be able to walk without RW, be normal, be able to lift, be able to get into car normal again  NEXT MD VISIT: 11/16/23  OBJECTIVE:  Note: Objective measures were completed at  Evaluation unless otherwise noted.  DIAGNOSTIC FINDINGS:   PATIENT SURVEYS:  LEFS Lower Extremity Functional Score: 14 / 80 = 17.5 % 11/30/23: Lower Extremity Functional Score: 53 / 80 = 66.3 %  01/03/24: Lower Extremity Functional Score: 52 / 80 = 65.0 %  COGNITION: Overall cognitive status: Within functional limits for tasks assessed     SENSATION: Light touch: Impaired  Decreased on R greater trochanter/hip region as compared to L  EDEMA:  Mild edema on R Hip area   POSTURE: No Significant postural limitations  PALPATION: TTP general R hip region   LOWER EXTREMITY ROM:  Active ROM Right eval Left eval Right 11/12/23 Right  11/30/23 Right  01/03/24  Hip flexion 50   AROM 100 PROM 110 105 AROM  Hip extension 0/to neutral  ~5, in prone ~10 in prone ~10  Hip abduction   ~20 AROM (~40 PROM)  27  Hip adduction       Hip internal rotation   18  PROM 25 deg 24 AROM  Hip external rotation   5 PROM 20 deg 15 AROM   Knee flexion       Knee extension       Ankle dorsiflexion       Ankle plantarflexion       Ankle inversion       Ankle eversion        (Blank rows = not tested)  LOWER EXTREMITY MMT:  MMT Right eval Left eval Right  11/09/23 Right  11/30/23 Right 01/03/24  Hip flexion 2+ 4-  3+/4- 4  Hip extension  3+/4- seated position  4- seated position 4  Hip abduction     4-  Hip adduction     4-  Hip internal rotation       Hip external rotation       Knee flexion   4- 4- 4-  Knee extension   3+ 4- 4-  Ankle dorsiflexion 5 5     Ankle plantarflexion       Ankle inversion       Ankle eversion        (Blank rows = not tested)   FUNCTIONAL TESTS:  5 times sit to stand: 30, use of BUE to push to stand  2 minute walk test: 90 ft, w/ RW, see gait details below  11/30/23: 5xSTS: 14, no UE use to push : 201', no AD, dec stance time on RLE, mild hiking/vaulting of LLE  01/03/24: 5xSTS: 11 : 305', no AD  GAIT: Distance walked: 90 ft Assistive device  utilized: Environmental consultant - 2 wheeled Level of assistance: Modified independence Comments: Dec velocity, mild hiking with LLE 2/2 feeling RLE feeling longer, no unsteadiness or LOB to note, reports inc in pain with inc WB into RLE.  TREATMENT DATE:  01/20/24; Standing:   Squat front of chair  Marching UE/LE 10x 3 holds alternating  Vector stance 5x 5  Stepping over hurdles, unable to clear Rt LE over 12in forward and lateral Bodycraft retro gait 4Pl 5x, sidestep 3PL 5x each  Tandem stance on foam with UE flexion 10x each position  01/03/24: Progress Note: LEFS R hip ROM R hip MMT 5 Times Sit to Stand 2 minute walk test Discussion on POC   12/24/2023  Therapeutic Exercise: -Supine bridges 2 sets of 10 reps, 3 second holds, pt cued for max hip extension, second set with GTB at knees -SLR, 1 set of 10 reps -DKTC on green exercise ball, 2 sets of 10 reps, pt cued for controlled motion -Treadmill, 5 minutes, grade 2 incline, 1.0 for first 3 minutes, 1.3 for last 2 minutes -Step ups, 8 inch, 1 sets of 8 reps, pt cued for only one UE use -Lateral step up and overs, 8 inch steps, 1 sets of 8 reps -Hip hikes on 4 inch step, RLE only, 2 sets of 8 reps -Walking marches, with 3 lb weights, 1 lap, 40 feet -Lateral stepping with 3 lb weights, 1 lap, 40 feet -Sit to stands, 2 sets of 3 reps, throughout session  12/17/23: Ambulation, 232ft, no AD Standing:  Marching  Hurdles 6in forward step over5RT inside // bars increased difficulty with 12 in  Hurdles 6in lateral step over 5RT inside // bars increased difficulty with 12 in  Vector stance 5x 5 with HHA  Forward step up 6in 1 then no HHA 15x  Step down 6in step 6in with HHA 15x  Squat front of chair 15x     PATIENT EDUCATION:  Education details: PT evaluation, objective findings, POC, Importance of HEP, Precautions,  Clinic policies  Person educated: Patient Education method: Explanation and Demonstration Education comprehension: verbalized understanding and returned demonstration  HOME EXERCISE PROGRAM: Access Code: [email or web address] URL: [email or web address]  Date: 11/02/2023 Prepared by: Rosaria Powell-Butler Exercises - Supine Heel Slide with Strap  - 2 x daily - 7 x weekly - 3 sets - 10 reps - Supine Quad Set  - 2 x daily - 7 x weekly - 3 sets - 10 reps - 5 hold - Supine Gluteal Sets  - 2 x daily - 7 x weekly - 3 sets - 10 reps - 5 hold - Sit to Stand Without Arm Support  - 2 x daily - 7 x weekly - 3 sets - 10 reps -Daily walking, one lap around house ~ every 2 hours during day.   Date: 11/09/2023 Prepared by: Greig Fuse - Seated Long Arc Quad  - 2 x daily - 7 x weekly - 3 sets - 10 reps - 5 sec hold - Supine March  - 2 x daily - 7 x weekly - 3 sets - 10 reps - Supine Bridge  - 2 x daily - 7 x weekly - 3 sets - 10 reps -SLR AAROM-AROM 2X day 3X10   Exercises - Standing Hip Abduction with Counter Support  - 2 x daily - 7 x weekly - 3 sets - 10 reps - Standing March with Counter Support  - 2 x daily - 7 x weekly - 3 sets - 10 reps - Heel Raises with Counter Support  - 2 x daily - 7 x weekly - 3 sets - 10 reps  11/24/23: -Clam  12/02/23: Sidestep with resistance theraband  12/13/23: Exercises - Supine Hip Internal and External Rotation  -  2 x daily - 7 x weekly - 3 sets - 10 reps - Prone Alternating Bent Leg Fallout  - 2 x daily - 7 x weekly - 3 sets - 10 reps  12/17/23: -vector stance iwht HHA (Pt reports this has been bothering her at home and she has not been able to do it. 12/24/23)   ASSESSMENT:  CLINICAL IMPRESSION: Progress note performed on this date. Patient demonstrating good progress with R hip ROM and strength gains this date. Improvements with 5 times sit to stand test and 2 minute walk test further demonstrating improved endurance and strength/power. Patient  continues to express concern with leg length discrepancy and reports increased soreness following return to work. Discussion on purchasing adjustable heel lifts from [employer] prior to next PT session. Patient would continue to benefit from skilled physical therapy for increased endurance with ambulation, increased RLE strength, and improved balance for improved quality of life, improved community ambulation and continued progress towards therapy goals.  Session focus with hip strengthening and balance training.  Pt presents with limited hip flexion range with inability to step over hurdles with 12in height.  Balance is improving, no HHA required with hurdle exercise this session and presents with improved stability with marching exercise today.  Added bodycraft for dynamic strengthening and control that was tolerated well.  Discussed leg length discrepancy, pt plans to go to [facility] for outer heel support in August.  No reports of increased pain through session.  OBJECTIVE IMPAIRMENTS: Abnormal gait, decreased activity tolerance, decreased balance, decreased coordination, decreased endurance, decreased mobility, difficulty walking, decreased ROM, decreased strength, hypomobility, impaired flexibility, and pain.   ACTIVITY LIMITATIONS: bending, sitting, standing, squatting, stairs, transfers, and bed mobility  PARTICIPATION LIMITATIONS: cleaning, laundry, community activity, and yard work  [facility] POTENTIAL: Good  CLINICAL DECISION MAKING: Stable/uncomplicated  EVALUATION COMPLEXITY: Low   GOALS: Goals reviewed with patient? No  SHORT TERM GOALS: Target date: 11/16/23 Patient will be independent with performance of HEP to demonstrate adequate self management of symptoms.  Baseline:  Goal status: MET  2.   Patient will report at least a 25% improvement with function or pain overall since beginning PT. Baseline: Reports 40% better on 11/30/23 Goal status: MET   LONG TERM GOALS: Target date:  01/28/24  Patient will improve LEFS score to at least 65/80 to demonstrate improved perceived function while meeting MCID.  Baseline: 53/80 on 11/30/23 Goal status: REVISED 11/30/23 2.  Patient will improve hip flex ROM to at least 120 degrees to show improved LE mobility for improve functional transfers and QOL. Baseline: 100 AROM, 110 PROM on 11/30/23 Goal status: IN PROGRESS 3. Patient will improve hip ext ROM to at least 15 degrees to show improved LE mobility to improve gait mechanics. Baseline: 10 DEG ON 11/30/23 Goal status: REVISED on 11/30/23 4. Patient will score at least 4- on hip flex MMT on RLE to show improved LE strength needed for ambulation without AD. Baseline:  Goal status: MET 4.  Patient will increase gait distance to 350 ft during test with least restrictive assistive device to demonstrate improved functional mobility walking household and community distances.  Baseline: 305' ON 11/2723 Goal status: REVISED ON 01/03/24   PLAN:  PT FREQUENCY: 1-2x/week  PT DURATION: 8 weeks  PLANNED INTERVENTIONS: 97164- PT Re-evaluation, 97110-Therapeutic exercises, 97530- Therapeutic activity, 97112- Neuromuscular re-education, 97535- Self Care, [id number]- Manual therapy, [phone]- Gait training, Patient/Family education, Balance training, Stair training, Joint mobilization, Scar mobilization, DME instructions, and Moist heat  PLAN  FOR NEXT SESSION: Progress LE mobility and strengthening within protocol/pt tolerance, functional/body mechanics with lifting/squatting for return to work, balance  Augustin Mclean, LPTA/CLT; WILLAIM [phone]  2:20 PM, 01/20/24

## 2024-01-24 ENCOUNTER — Ambulatory Visit: Admitting: Bariatrics

## 2024-01-24 ENCOUNTER — Encounter: Payer: Self-pay | Admitting: Bariatrics

## 2024-01-24 VITALS — BP 124/82 | HR 84 | Temp 98.4°F | Ht 60.5 in | Wt 185.0 lb

## 2024-01-24 DIAGNOSIS — E669 Obesity, unspecified: Secondary | ICD-10-CM | POA: Diagnosis not present

## 2024-01-24 DIAGNOSIS — R632 Polyphagia: Secondary | ICD-10-CM | POA: Diagnosis not present

## 2024-01-24 DIAGNOSIS — F5089 Other specified eating disorder: Secondary | ICD-10-CM | POA: Diagnosis not present

## 2024-01-24 DIAGNOSIS — E559 Vitamin D deficiency, unspecified: Secondary | ICD-10-CM

## 2024-01-24 DIAGNOSIS — Z6835 Body mass index (BMI) 35.0-35.9, adult: Secondary | ICD-10-CM

## 2024-01-24 MED ORDER — VITAMIN D (ERGOCALCIFEROL) 1.25 MG (50000 UNIT) PO CAPS: 50000.0000 [IU] | ORAL_CAPSULE | ORAL | 0 refills | Status: DC

## 2024-01-24 MED ORDER — TOPIRAMATE 50 MG PO TABS: 50.0000 mg | ORAL_TABLET | Freq: Two times a day (BID) | ORAL | 0 refills | Status: DC

## 2024-01-24 MED ORDER — WEGOVY 1 MG/0.5ML ~~LOC~~ SOAJ: 1.0000 mg | SUBCUTANEOUS | 0 refills | Status: DC

## 2024-01-24 NOTE — Progress Notes (Signed)
 WEIGHT SUMMARY AND BIOMETRICS  Weight Lost Since Last Visit: 0  Weight Gained Since Last Visit: 2lb   Vitals Temp: 98.4 F (36.9 C) BP: 124/82 Pulse Rate: 84 SpO2: 98 %   Anthropometric Measurements Height: 5' 0.5 (1.537 m) Weight: 185 lb (83.9 kg) BMI (Calculated): 35.52 Weight at Last Visit: 183lb Weight Lost Since Last Visit: 0 Weight Gained Since Last Visit: 2lb Starting Weight: 201lb Total Weight Loss (lbs): 16 lb (7.258 kg)   Body Composition  Body Fat %: 46.5 % Fat Mass (lbs): 86.2 lbs Muscle Mass (lbs): 94 lbs Total Body Water (lbs): 78.6 lbs Visceral Fat Rating : 12   Other Clinical Data Fasting: no Labs: no Today's Visit #: 7 Starting Date: 09/13/23    OBESITY Erica [name] is here to discuss her progress with her obesity treatment plan along with follow-up of her obesity related diagnoses.    Nutrition Plan: the Category 2 plan - 70% adherence.  Current exercise: physical therapy and walking on treadmill   Interim History:  She is up 2 lbs since her last visit. Her water weight is up 5 + lbs since her last visit.  Eating all of the food on the plan., Protein intake is less than prescribed., Water intake is adequate., and Reports excessive cravings.   Pharmacotherapy: Erica [name] is on Wegovy  1.0 mg SQ weekly Adverse side effects: None Hunger is moderately controlled.  Cravings are poorly controlled.  Assessment/Plan:   Vitamin D  Deficiency Vitamin D  is not at goal of 50.  Most recent vitamin D  level was 8.1. She is on  prescription ergocalciferol  50,000 IU weekly. Lab Results  Component Value Date   VD25OH 8.1 (L) 09/13/2023    Plan: Refill prescription vitamin D  50,000 IU weekly.   Eating disorder/emotional eating Erica [name] has had issues with stress eating, emotional eating, nighttime eating, and boredom eating.   Currently this is moderately controlled. Overall mood is stable. Denies suicidal/homicidal ideation. Medication(s): Topamax  50 mg daily with dinner  Plan:  Motivational interviewing as well as evidence-based interventions for health behavior change were utilized today including the discussion of self monitoring techniques, problem-solving barriers and SMART goal setting techniques.  Increase Topamax  to 50 mg twice daily instead of daily in the evening #60 with no refills Discussed distractions to curb eating behaviors. Discussed activities to do with one's hands in the evening  Be sure to get adequate rest as lack of rest can trigger appetite.  Have plan in place for stressful events.  Consider other rewards besides food.    Polyphagia Erica [name] endorses excessive hunger.  Medication(s): Wegovy  Effects of medication:  moderately controlled. Cravings are poorly controlled.   Plan: Medication(s): Wegovy  1.0 mg SQ weekly Will increase water, protein and fiber to help assuage hunger.  Will minimize foods that have a high glucose index/load to minimize reactive hypoglycemia.  Discussed healthy snacks and handouts  given. She will do more meal planning.    Generalized Obesity: Current BMI BMI (Calculated): 35.52   Pharmacotherapy Plan Continue and refill  Wegovy  1.0 mg SQ weekly  Erica [name] is currently in the action stage of change. As such, her goal is to continue with weight loss efforts.  She has agreed to the Category 2 plan.  Exercise goals: All adults should avoid inactivity. Some physical activity is better than none, and adults who participate in any amount of physical activity gain some health benefits.  Behavioral modification strategies: increasing lean protein intake, decrease eating out, meal planning , increase water intake, better snacking choices, planning for success, increasing vegetables, avoiding temptations, keep healthy foods in the home, weigh protein portions,  measure portion sizes, work on smaller portions, and mindful eating.  Erica [name] has agreed to follow-up with our clinic in 4 weeks.   Objective:   VITALS: Per patient if applicable, see vitals. GENERAL: Alert and in no acute distress. CARDIOPULMONARY: No increased WOB. Speaking in clear sentences.  PSYCH: Pleasant and cooperative. Speech normal rate and rhythm. Affect is appropriate. Insight and judgement are appropriate. Attention is focused, linear, and appropriate.  NEURO: Oriented as arrived to appointment on time with no prompting.   Attestation Statements:   This was prepared with the assistance of Engineer, civil (consulting).  Occasional wrong-word or sound-a-like substitutions may have occurred due to the inherent limitations of voice recognition   Clayborne Daring, DO

## 2024-01-26 ENCOUNTER — Encounter (HOSPITAL_COMMUNITY): Payer: Self-pay

## 2024-01-26 ENCOUNTER — Ambulatory Visit (HOSPITAL_COMMUNITY)

## 2024-01-26 DIAGNOSIS — Z7409 Other reduced mobility: Secondary | ICD-10-CM | POA: Diagnosis not present

## 2024-01-26 DIAGNOSIS — M25651 Stiffness of right hip, not elsewhere classified: Secondary | ICD-10-CM

## 2024-01-26 DIAGNOSIS — M6281 Muscle weakness (generalized): Secondary | ICD-10-CM

## 2024-01-26 NOTE — Therapy (Addendum)
 OUTPATIENT PHYSICAL THERAPY LOWER EXTREMITY TREATMENT   Patient Name: Ronnette Rump MRN: [id number] DOB:Jun 06, 1976, 48 y.o., female Today's Date: 01/26/2024   END OF SESSION:  PT End of Session - 01/26/24 1108     Visit Number 14    Number of Visits 16    Date for PT Re-Evaluation 01/31/24    Authorization Type [place] Medicaid [employer] [place]    Authorization Time Period 16 visits approved 4/29-6/30; [employer] has approved codes 02889 and 97535 for 20 units/5 visits and 97164 for 4 units/1 visit. Codes 02469, [id number], [id number], and [phone] do not require prior auth from dates 01/04/2024-01/31/2024 [id number]    Authorization - Visit Number 13    Authorization - Number of Visits 16    Progress Note Due on Visit 16    PT Start Time 1108    PT Stop Time 1146    PT Time Calculation (min) 38 min    Activity Tolerance Patient tolerated treatment well    Behavior During Therapy WFL for tasks assessed/performed             Past Medical History:  Diagnosis Date   Acid reflux    Asthma    Back pain    Eczema    Heart burn    High blood pressure    Osteoarthritis    stage 4 right hip   Pelvic inflammatory disease    SOBOE (shortness of breath on exertion)    Tubo-ovarian abscess    Past Surgical History:  Procedure Laterality Date   ABDOMINAL HYSTERECTOMY N/A 07/07/2013   Procedure: HYSTERECTOMY ABDOMINAL with bilateral salpingo-oophorectomy;  Surgeon: Agent KANDICE Solomons, MD;  Location: [facility];  Service: Gynecology;  Laterality: N/A;   CESAREAN SECTION     DILATION AND EVACUATION N/A 02/03/2013   Procedure: DILATATION AND EVACUATION;  Surgeon: Agent KANDICE Solomons, MD;  Location: [facility];  Service: Gynecology;  Laterality: N/A;   TOTAL HIP ARTHROPLASTY     Patient Active Problem List   Diagnosis Date Noted   Prediabetes 09/14/2023   Vitamin D  deficiency 09/14/2023   Elevated cholesterol 09/14/2023   Mild intermittent allergic asthma without complication 05/04/2023    GERD (gastroesophageal reflux disease) 05/04/2023   Preoperative respiratory examination 05/04/2023   Atypical asthma ? VCD 08/19/2021   Cigarette smoker 08/19/2021   Tobacco use disorder 01/23/2021   Chronic PID (chronic pelvic inflammatory disease) 07/07/2013   Right tubo-ovarian abscess 04/20/2013   Pelvic abscess in female 02/08/2013    PCP: Catharine Ethelene BIRCH, PA-C  REFERRING PROVIDER: Georgina Elgin Isela DOUGLAS, MD  REFERRING DIAG: M16.11 (ICD-10-CM) - Unilateral primary osteoarthritis, right hip Z47.1 (ICD-10-CM) - Aftercare following joint replacement surgery Z96.641 (ICD-10-CM) - Presence of right artificial hip joint  THERAPY DIAG:  Impaired functional mobility, balance, gait, and endurance  Decreased range of right hip movement  Muscle weakness (generalized)  Rationale for Evaluation and Treatment: Rehabilitation  ONSET DATE: 1 year for hip pain   SUBJECTIVE:   SUBJECTIVE STATEMENT: Hip feels okay does, going to [facility] 8/7 for heel support on shoes.  Requested referral for PT for LBP, pain scale 6/10.  Has difficulty getting down to floor.   PERTINENT HISTORY: R THA on 10/06/23 3 C-sections Total hysterectomy PAIN:  Are you having pain? Yes: NPRS scale: 0/10 Rt hip, 6/10 LBP.  Pain location: R hip joint, radiating to R knee, lateral aspect  Pain description: Dull, ache, numbness, felt random twinge this morning at incision  Aggravating factors: Walking, mobility Relieving factors:  Medication, Ice some  PRECAUTIONS: Anterior hip  RED FLAGS: None   WEIGHT BEARING RESTRICTIONS: No, WBAT  FALLS:  Has patient fallen in last 6 months? No  LIVING ENVIRONMENT: Lives with: lives with their family Stairs: Yes: External: 4 steps; on left going up Has following equipment at home: Single point cane and Walker - 2 wheeled  OCCUPATION: NA  PLOF: Independent  PATIENT GOALS: Be able to walk without RW, be normal, be able to lift, be able to get into car normal  again  NEXT MD VISIT: 11/16/23  OBJECTIVE:  Note: Objective measures were completed at Evaluation unless otherwise noted.  DIAGNOSTIC FINDINGS:   PATIENT SURVEYS:  LEFS Lower Extremity Functional Score: 14 / 80 = 17.5 % 11/30/23: Lower Extremity Functional Score: 53 / 80 = 66.3 %  01/03/24: Lower Extremity Functional Score: 52 / 80 = 65.0 %  COGNITION: Overall cognitive status: Within functional limits for tasks assessed     SENSATION: Light touch: Impaired  Decreased on R greater trochanter/hip region as compared to L  EDEMA:  Mild edema on R Hip area   POSTURE: No Significant postural limitations  PALPATION: TTP general R hip region   LOWER EXTREMITY ROM:  Active ROM Right eval Left eval Right 11/12/23 Right  11/30/23 Right  01/03/24  Hip flexion 50   AROM 100 PROM 110 105 AROM  Hip extension 0/to neutral  ~5, in prone ~10 in prone ~10  Hip abduction   ~20 AROM (~40 PROM)  27  Hip adduction       Hip internal rotation   18  PROM 25 deg 24 AROM  Hip external rotation   5 PROM 20 deg 15 AROM   Knee flexion       Knee extension       Ankle dorsiflexion       Ankle plantarflexion       Ankle inversion       Ankle eversion        (Blank rows = not tested)  LOWER EXTREMITY MMT:  MMT Right eval Left eval Right  11/09/23 Right  11/30/23 Right 01/03/24  Hip flexion 2+ 4-  3+/4- 4  Hip extension  3+/4- seated position  4- seated position 4  Hip abduction     4-  Hip adduction     4-  Hip internal rotation       Hip external rotation       Knee flexion   4- 4- 4-  Knee extension   3+ 4- 4-  Ankle dorsiflexion 5 5     Ankle plantarflexion       Ankle inversion       Ankle eversion        (Blank rows = not tested)   FUNCTIONAL TESTS:  5 times sit to stand: 30, use of BUE to push to stand  2 minute walk test: 90 ft, w/ RW, see gait details below  11/30/23: 5xSTS: 14, no UE use to push : 201', no AD, dec stance time on RLE, mild hiking/vaulting of  LLE  01/03/24: 5xSTS: 11 : 305', no AD  GAIT: Distance walked: 90 ft Assistive device utilized: Environmental consultant - 2 wheeled Level of assistance: Modified independence Comments: Dec velocity, mild hiking with LLE 2/2 feeling RLE feeling longer, no unsteadiness or LOB to note, reports inc in pain with inc WB into RLE.  TREATMENT DATE:  01/26/24: Standing:  -Rockerboard 1' each (lateral weight shift, DF/PF)  -Lunge onto 4in step  -squat to heel raise 3 each  -Stepping over hurdles, unable to clear Rt LE over 12in forward and lateral  -Isometric hip flexion 10x5 standing against wall Bodycraft lateral step out 3Pl 3RT each  -Supine; 90/90 isometric hip flexion 10x 5     01/20/24; Standing:   Squat front of chair  Marching UE/LE 10x 3 holds alternating  Vector stance 5x 5  Stepping over hurdles, unable to clear Rt LE over 12in forward and lateral Bodycraft retro gait 4Pl 5x, sidestep 3PL 5x each  Tandem stance on foam with UE flexion 10x each position  01/03/24: Progress Note: LEFS R hip ROM R hip MMT 5 Times Sit to Stand 2 minute walk test Discussion on POC     PATIENT EDUCATION:  Education details: PT evaluation, objective findings, POC, Importance of HEP, Precautions, Clinic policies  Person educated: Patient Education method: Explanation and Demonstration Education comprehension: verbalized understanding and returned demonstration  HOME EXERCISE PROGRAM: Access Code: [email or web address] URL: [email or web address]  Date: 11/02/2023 Prepared by: Rosaria Powell-Butler Exercises - Supine Heel Slide with Strap  - 2 x daily - 7 x weekly - 3 sets - 10 reps - Supine Quad Set  - 2 x daily - 7 x weekly - 3 sets - 10 reps - 5 hold - Supine Gluteal Sets  - 2 x daily - 7 x weekly - 3 sets - 10 reps - 5 hold - Sit to Stand Without Arm Support  - 2  x daily - 7 x weekly - 3 sets - 10 reps -Daily walking, one lap around house ~ every 2 hours during day.   Date: 11/09/2023 Prepared by: Greig Fuse - Seated Long Arc Quad  - 2 x daily - 7 x weekly - 3 sets - 10 reps - 5 sec hold - Supine March  - 2 x daily - 7 x weekly - 3 sets - 10 reps - Supine Bridge  - 2 x daily - 7 x weekly - 3 sets - 10 reps -SLR AAROM-AROM 2X day 3X10   Exercises - Standing Hip Abduction with Counter Support  - 2 x daily - 7 x weekly - 3 sets - 10 reps - Standing March with Counter Support  - 2 x daily - 7 x weekly - 3 sets - 10 reps - Heel Raises with Counter Support  - 2 x daily - 7 x weekly - 3 sets - 10 reps  11/24/23: -Clam  12/02/23: Sidestep with resistance theraband  12/13/23: Exercises - Supine Hip Internal and External Rotation  - 2 x daily - 7 x weekly - 3 sets - 10 reps - Prone Alternating Bent Leg Fallout  - 2 x daily - 7 x weekly - 3 sets - 10 reps  12/17/23: -vector stance iwht HHA (Pt reports this has been bothering her at home and she has not been able to do it. 12/24/23)   ASSESSMENT:  CLINICAL IMPRESSION: Continued session focus with hip strengthening and balance.  Pt able to step over 12in hurdles only with momentum and some compensation with sidebend and rotation.  Added hip flexion strengthening exercises.  Also added lunges as functional strengthening with some cueing required for form and mechnanics.  Pt able to complete whole session with no reports of Rt hip pain.  Pt did state she is beginning to have Lt hip pain and continues to have LBP  that was no change.  Pt with referral for LBP.    OBJECTIVE IMPAIRMENTS: Abnormal gait, decreased activity tolerance, decreased balance, decreased coordination, decreased endurance, decreased mobility, difficulty walking, decreased ROM, decreased strength, hypomobility, impaired flexibility, and pain.   ACTIVITY LIMITATIONS: bending, sitting, standing, squatting, stairs, transfers, and bed  mobility  PARTICIPATION LIMITATIONS: cleaning, laundry, community activity, and yard work  [facility] POTENTIAL: Good  CLINICAL DECISION MAKING: Stable/uncomplicated  EVALUATION COMPLEXITY: Low   GOALS: Goals reviewed with patient? No  SHORT TERM GOALS: Target date: 11/16/23 Patient will be independent with performance of HEP to demonstrate adequate self management of symptoms.  Baseline:  Goal status: MET  2.   Patient will report at least a 25% improvement with function or pain overall since beginning PT. Baseline: Reports 40% better on 11/30/23 Goal status: MET   LONG TERM GOALS: Target date: 01/28/24  Patient will improve LEFS score to at least 65/80 to demonstrate improved perceived function while meeting MCID.  Baseline: 53/80 on 11/30/23 Goal status: REVISED 11/30/23 2.  Patient will improve hip flex ROM to at least 120 degrees to show improved LE mobility for improve functional transfers and QOL. Baseline: 100 AROM, 110 PROM on 11/30/23 Goal status: IN PROGRESS 3. Patient will improve hip ext ROM to at least 15 degrees to show improved LE mobility to improve gait mechanics. Baseline: 10 DEG ON 11/30/23 Goal status: REVISED on 11/30/23 4. Patient will score at least 4- on hip flex MMT on RLE to show improved LE strength needed for ambulation without AD. Baseline:  Goal status: MET 4.  Patient will increase gait distance to 350 ft during test with least restrictive assistive device to demonstrate improved functional mobility walking household and community distances.  Baseline: 305' ON 11/2723 Goal status: REVISED ON 01/03/24   PLAN:  PT FREQUENCY: 1-2x/week  PT DURATION: 8 weeks  PLANNED INTERVENTIONS: 97164- PT Re-evaluation, 97110-Therapeutic exercises, 97530- Therapeutic activity, 97112- Neuromuscular re-education, 97535- Self Care, 02859- Manual therapy, [phone]- Gait training, Patient/Family education, Balance training, Stair training, Joint mobilization, Scar  mobilization, DME instructions, and Moist heat  PLAN FOR NEXT SESSION: Progress LE mobility and strengthening within protocol/pt tolerance, functional/body mechanics with lifting/squatting for return to work, balance.  Pt to be reassess next session/week.  Augustin Mclean, LPTA/CLT; [facility] [phone]  2:28 PM, 01/26/24

## 2024-01-28 ENCOUNTER — Encounter (HOSPITAL_COMMUNITY): Payer: Self-pay

## 2024-01-28 ENCOUNTER — Ambulatory Visit (HOSPITAL_COMMUNITY)

## 2024-01-28 DIAGNOSIS — Z7409 Other reduced mobility: Secondary | ICD-10-CM | POA: Diagnosis not present

## 2024-01-28 DIAGNOSIS — M6281 Muscle weakness (generalized): Secondary | ICD-10-CM

## 2024-01-28 DIAGNOSIS — M25651 Stiffness of right hip, not elsewhere classified: Secondary | ICD-10-CM

## 2024-01-28 NOTE — Therapy (Signed)
 OUTPATIENT PHYSICAL THERAPY LOWER EXTREMITY TREATMENT   Patient Name: Erica [name]: [id number] DOB:12/18/1975, 48 y.o., female Today's Date: 01/28/2024   END OF SESSION:  PT End of Session - 01/28/24 1158     Visit Number 15    Number of Visits 16    Date for PT Re-Evaluation 01/31/24    Authorization Type [place] Medicaid [employer]     Authorization Time Period 16 visits approved 4/29-6/30; [employer] has approved codes 02889 and 97535 for 20 units/5 visits and 97164 for 4 units/1 visit. Codes 02469, [id number], [phone], and [phone] do not require prior auth from dates 01/04/2024-01/31/2024 ([id number]    PT Start Time 1158    PT Stop Time 1232    PT Time Calculation (min) 34 min    Activity Tolerance Patient tolerated treatment well    Behavior During Therapy WFL for tasks assessed/performed             Past Medical History:  Diagnosis Date   Acid reflux    Asthma    Back pain    Eczema    Heart burn    High blood pressure    Osteoarthritis    stage 4 right hip   Pelvic inflammatory disease    SOBOE (shortness of breath on exertion)    Tubo-ovarian abscess    Past Surgical History:  Procedure Laterality Date   ABDOMINAL HYSTERECTOMY N/A 07/07/2013   Procedure: HYSTERECTOMY ABDOMINAL with bilateral salpingo-oophorectomy;  Surgeon: Agent KANDICE Solomons, MD;  Location: [facility];  Service: Gynecology;  Laterality: N/A;   CESAREAN SECTION     DILATION AND EVACUATION N/A 02/03/2013   Procedure: DILATATION AND EVACUATION;  Surgeon: Agent KANDICE Solomons, MD;  Location: [facility];  Service: Gynecology;  Laterality: N/A;   TOTAL HIP ARTHROPLASTY     Patient Active Problem List   Diagnosis Date Noted   Prediabetes 09/14/2023   Vitamin D  deficiency 09/14/2023   Elevated cholesterol 09/14/2023   Mild intermittent allergic asthma without complication 05/04/2023   GERD (gastroesophageal reflux disease) 05/04/2023   Preoperative respiratory examination 05/04/2023   Atypical  asthma ? VCD 08/19/2021   Cigarette smoker 08/19/2021   Tobacco use disorder 01/23/2021   Chronic PID (chronic pelvic inflammatory disease) 07/07/2013   Right tubo-ovarian abscess 04/20/2013   Pelvic abscess in female 02/08/2013    PCP: Catharine Ethelene BIRCH, PA-C  REFERRING PROVIDER: Georgina Elgin Isela DOUGLAS, MD  REFERRING DIAG: M16.11 (ICD-10-CM) - Unilateral primary osteoarthritis, right hip Z47.1 (ICD-10-CM) - Aftercare following joint replacement surgery Z96.641 (ICD-10-CM) - Presence of right artificial hip joint  THERAPY DIAG:  Impaired functional mobility, balance, gait, and endurance  Decreased range of right hip movement  Muscle weakness (generalized)  Rationale for Evaluation and Treatment: Rehabilitation  ONSET DATE: 1 year for hip pain   SUBJECTIVE:   SUBJECTIVE STATEMENT: Hip feels okay today, numbness in lateral aspect of thigh.  Goes to [facility] 8/7 for heel support, legs are 5/8th of inch off.  Got call for MRI scheduled for tomorrow.    PERTINENT HISTORY: R THA on 10/06/23 3 C-sections Total hysterectomy PAIN:  Are you having pain? Yes: NPRS scale: 0/10 Rt hip, 6/10 LBP.  Pain location: R hip joint, radiating to R knee, lateral aspect  Pain description: Dull, ache, numbness, felt random twinge this morning at incision  Aggravating factors: Walking, mobility Relieving factors: Medication, Ice some  PRECAUTIONS: Anterior hip  RED FLAGS: None   WEIGHT BEARING RESTRICTIONS: No, WBAT  FALLS:  Has patient  fallen in last 6 months? No  LIVING ENVIRONMENT: Lives with: lives with their family Stairs: Yes: External: 4 steps; on left going up Has following equipment at home: Single point cane and Walker - 2 wheeled  OCCUPATION: NA  PLOF: Independent  PATIENT GOALS: Be able to walk without RW, be normal, be able to lift, be able to get into car normal again  NEXT MD VISIT: 11/16/23  OBJECTIVE:  Note: Objective measures were completed at Evaluation unless  otherwise noted.  DIAGNOSTIC FINDINGS:   PATIENT SURVEYS:  LEFS Lower Extremity Functional Score: 14 / 80 = 17.5 % 11/30/23: Lower Extremity Functional Score: 53 / 80 = 66.3 %  01/03/24: Lower Extremity Functional Score: 52 / 80 = 65.0 %  COGNITION: Overall cognitive status: Within functional limits for tasks assessed     SENSATION: Light touch: Impaired  Decreased on R greater trochanter/hip region as compared to L  EDEMA:  Mild edema on R Hip area   POSTURE: No Significant postural limitations  PALPATION: TTP general R hip region   LOWER EXTREMITY ROM:  Active ROM Right eval Left eval Right 11/12/23 Right  11/30/23 Right  01/03/24  Hip flexion 50   AROM 100 PROM 110 105 AROM  Hip extension 0/to neutral  ~5, in prone ~10 in prone ~10  Hip abduction   ~20 AROM (~40 PROM)  27  Hip adduction       Hip internal rotation   18  PROM 25 deg 24 AROM  Hip external rotation   5 PROM 20 deg 15 AROM   Knee flexion       Knee extension       Ankle dorsiflexion       Ankle plantarflexion       Ankle inversion       Ankle eversion        (Blank rows = not tested)  LOWER EXTREMITY MMT:  MMT Right eval Left eval Right  11/09/23 Right  11/30/23 Right 01/03/24  Hip flexion 2+ 4-  3+/4- 4  Hip extension  3+/4- seated position  4- seated position 4  Hip abduction     4-  Hip adduction     4-  Hip internal rotation       Hip external rotation       Knee flexion   4- 4- 4-  Knee extension   3+ 4- 4-  Ankle dorsiflexion 5 5     Ankle plantarflexion       Ankle inversion       Ankle eversion        (Blank rows = not tested)   FUNCTIONAL TESTS:  5 times sit to stand: 30, use of BUE to push to stand  2 minute walk test: 90 ft, w/ RW, see gait details below  11/30/23: 5xSTS: 14, no UE use to push : 201', no AD, dec stance time on RLE, mild hiking/vaulting of LLE  01/03/24: 5xSTS: 11 : 305', no AD  GAIT: Distance walked: 90 ft Assistive device utilized: Environmental consultant -  2 wheeled Level of assistance: Modified independence Comments: Dec velocity, mild hiking with LLE 2/2 feeling RLE feeling longer, no unsteadiness or LOB to note, reports inc in pain with inc WB into RLE.  TREATMENT DATE:  01/28/24: Standing:   -Squat to heel raises 15x 3 each  -Toe tapping 8in step height alternating 15x -Stepping over hurdles, unable to clear Rt LE over 12in forward and lateral  -Bodycraft retro and lateral step 3RT 3Pl -Stairs 7in reciprocal pattern no HR needed -Deep squats 5x -Proper lifting 8lb box 5x  01/26/24: Standing:  -Rockerboard 1' each (lateral weight shift, DF/PF)  -Lunge onto 4in step  -squat to heel raise 3 each  -Stepping over hurdles, unable to clear Rt LE over 12in forward and lateral  -Isometric hip flexion 10x5 standing against wall Bodycraft lateral step out 3Pl 3RT each  -Supine; 90/90 isometric hip flexion 10x 5     01/20/24; Standing:   Squat front of chair  Marching UE/LE 10x 3 holds alternating  Vector stance 5x 5  Stepping over hurdles, unable to clear Rt LE over 12in forward and lateral Bodycraft retro gait 4Pl 5x, sidestep 3PL 5x each  Tandem stance on foam with UE flexion 10x each position  01/03/24: Progress Note: LEFS R hip ROM R hip MMT 5 Times Sit to Stand 2 minute walk test Discussion on POC     PATIENT EDUCATION:  Education details: PT evaluation, objective findings, POC, Importance of HEP, Precautions, Clinic policies  Person educated: Patient Education method: Explanation and Demonstration Education comprehension: verbalized understanding and returned demonstration  HOME EXERCISE PROGRAM: Access Code: [email or web address] URL: [email or web address]  Date: 11/02/2023 Prepared by: Rosaria Powell-Butler Exercises - Supine Heel Slide with Strap  - 2 x daily - 7 x weekly - 3 sets  - 10 reps - Supine Quad Set  - 2 x daily - 7 x weekly - 3 sets - 10 reps - 5 hold - Supine Gluteal Sets  - 2 x daily - 7 x weekly - 3 sets - 10 reps - 5 hold - Sit to Stand Without Arm Support  - 2 x daily - 7 x weekly - 3 sets - 10 reps -Daily walking, one lap around house ~ every 2 hours during day.   Date: 11/09/2023 Prepared by: Greig Fuse - Seated Long Arc Quad  - 2 x daily - 7 x weekly - 3 sets - 10 reps - 5 sec hold - Supine March  - 2 x daily - 7 x weekly - 3 sets - 10 reps - Supine Bridge  - 2 x daily - 7 x weekly - 3 sets - 10 reps -SLR AAROM-AROM 2X day 3X10   Exercises - Standing Hip Abduction with Counter Support  - 2 x daily - 7 x weekly - 3 sets - 10 reps - Standing March with Counter Support  - 2 x daily - 7 x weekly - 3 sets - 10 reps - Heel Raises with Counter Support  - 2 x daily - 7 x weekly - 3 sets - 10 reps  11/24/23: -Clam  12/02/23: Sidestep with resistance theraband  12/13/23: Exercises - Supine Hip Internal and External Rotation  - 2 x daily - 7 x weekly - 3 sets - 10 reps - Prone Alternating Bent Leg Fallout  - 2 x daily - 7 x weekly - 3 sets - 10 reps  12/17/23: -vector stance iwht HHA (Pt reports this has been bothering her at home and she has not been able to do it. 12/24/23)   ASSESSMENT:  CLINICAL IMPRESSION: Progressed functional strengthening with deep squats and lifting, some cueing for foot placement and mechanics then able to complete with good form.  Pt continues to show difficulty with 12in hurdles though improved control with movements and less body momentum required compared to last week.  Pt able to complete whole session with no reoprts of Rt hip pain.  Was limited by LBP that was monitored through session.  Pt with able at [facility] for shoe lift and has received referral for LBP.  MRI scheduled for tomorrow.    OBJECTIVE IMPAIRMENTS: Abnormal gait, decreased activity tolerance, decreased balance, decreased coordination, decreased endurance,  decreased mobility, difficulty walking, decreased ROM, decreased strength, hypomobility, impaired flexibility, and pain.   ACTIVITY LIMITATIONS: bending, sitting, standing, squatting, stairs, transfers, and bed mobility  PARTICIPATION LIMITATIONS: cleaning, laundry, community activity, and yard work  [facility] POTENTIAL: Good  CLINICAL DECISION MAKING: Stable/uncomplicated  EVALUATION COMPLEXITY: Low   GOALS: Goals reviewed with patient? No  SHORT TERM GOALS: Target date: 11/16/23 Patient will be independent with performance of HEP to demonstrate adequate self management of symptoms.  Baseline:  Goal status: MET  2.   Patient will report at least a 25% improvement with function or pain overall since beginning PT. Baseline: Reports 40% better on 11/30/23 Goal status: MET   LONG TERM GOALS: Target date: 01/28/24  Patient will improve LEFS score to at least 65/80 to demonstrate improved perceived function while meeting MCID.  Baseline: 53/80 on 11/30/23 Goal status: REVISED 11/30/23 2.  Patient will improve hip flex ROM to at least 120 degrees to show improved LE mobility for improve functional transfers and QOL. Baseline: 100 AROM, 110 PROM on 11/30/23 Goal status: IN PROGRESS 3. Patient will improve hip ext ROM to at least 15 degrees to show improved LE mobility to improve gait mechanics. Baseline: 10 DEG ON 11/30/23 Goal status: REVISED on 11/30/23 4. Patient will score at least 4- on hip flex MMT on RLE to show improved LE strength needed for ambulation without AD. Baseline:  Goal status: MET 4.  Patient will increase gait distance to 350 ft during test with least restrictive assistive device to demonstrate improved functional mobility walking household and community distances.  Baseline: 305' ON 11/2723 Goal status: REVISED ON 01/03/24   PLAN:  PT FREQUENCY: 1-2x/week  PT DURATION: 8 weeks  PLANNED INTERVENTIONS: 97164- PT Re-evaluation, 97110-Therapeutic exercises, 97530-  Therapeutic activity, 97112- Neuromuscular re-education, 97535- Self Care, 02859- Manual therapy, [phone]- Gait training, Patient/Family education, Balance training, Stair training, Joint mobilization, Scar mobilization, DME instructions, and Moist heat  PLAN FOR NEXT SESSION: Reassess next session, probable DC to HEP.  Does have referral for LBP.  Augustin Mclean, LPTA/CLT; [facility] [phone]  3:41 PM, 01/28/24

## 2024-01-31 ENCOUNTER — Encounter (HOSPITAL_COMMUNITY)

## 2024-02-21 ENCOUNTER — Ambulatory Visit: Admitting: Bariatrics

## 2024-02-21 ENCOUNTER — Encounter: Payer: Self-pay | Admitting: Bariatrics

## 2024-02-21 VITALS — BP 125/88 | HR 111 | Temp 98.2°F | Ht 60.5 in | Wt 173.0 lb

## 2024-02-21 DIAGNOSIS — E669 Obesity, unspecified: Secondary | ICD-10-CM | POA: Diagnosis not present

## 2024-02-21 DIAGNOSIS — E559 Vitamin D deficiency, unspecified: Secondary | ICD-10-CM

## 2024-02-21 DIAGNOSIS — Z6833 Body mass index (BMI) 33.0-33.9, adult: Secondary | ICD-10-CM

## 2024-02-21 DIAGNOSIS — R7303 Prediabetes: Secondary | ICD-10-CM

## 2024-02-21 DIAGNOSIS — F5089 Other specified eating disorder: Secondary | ICD-10-CM

## 2024-02-21 DIAGNOSIS — E66811 Obesity, class 1: Secondary | ICD-10-CM

## 2024-02-21 DIAGNOSIS — R632 Polyphagia: Secondary | ICD-10-CM

## 2024-02-21 MED ORDER — VITAMIN D (ERGOCALCIFEROL) 1.25 MG (50000 UNIT) PO CAPS: 50000.0000 [IU] | ORAL_CAPSULE | ORAL | 0 refills | Status: DC

## 2024-02-21 MED ORDER — TOPIRAMATE 50 MG PO TABS: 50.0000 mg | ORAL_TABLET | Freq: Two times a day (BID) | ORAL | 0 refills | Status: DC

## 2024-02-21 MED ORDER — WEGOVY 1 MG/0.5ML ~~LOC~~ SOAJ: 1.0000 mg | SUBCUTANEOUS | 0 refills | Status: DC

## 2024-02-21 NOTE — Progress Notes (Signed)
 WEIGHT SUMMARY AND BIOMETRICS  Weight Lost Since Last Visit: 12lb  Weight Gained Since Last Visit: 0   Vitals Temp: 98.2 F (36.8 C) BP: 125/88 Pulse Rate: (!) 111 SpO2: 98 %   Anthropometric Measurements Height: 5' 0.5 (1.537 m) Weight: 173 lb (78.5 kg) BMI (Calculated): 33.22 Weight at Last Visit: 185lb Weight Lost Since Last Visit: 12lb Weight Gained Since Last Visit: 0 Starting Weight: 201lb Total Weight Loss (lbs): 28 lb (12.7 kg)   Body Composition  Body Fat %: 42.6 % Fat Mass (lbs): 73.8 lbs Muscle Mass (lbs): 94.4 lbs Total Body Water (lbs): 68.6 lbs Visceral Fat Rating : 10   Other Clinical Data Fasting: yes Labs: yes Today's Visit #: 8 Starting Date: 09/13/23    OBESITY Erica [name] is here to discuss her progress with her obesity treatment plan along with follow-up of her obesity related diagnoses.    Nutrition Plan: the Category 2 plan - 75% adherence.  Current exercise: walking on treadmill.  Interim History:  She is down 12 lbs since her last visit. She states that she has been sick since her last visit. According to the bio-impedence scale her muscle mass is stable.  Eating all of the food on the plan., Protein intake is as prescribed, Is not skipping meals, and Water intake is adequate.   Pharmacotherapy: Erica [name] is on Erica [name]  1.0 mg SQ weekly Adverse side effects: None Hunger is moderately controlled.  Cravings are moderately controlled.  Assessment/Plan:   Erica [name]  Deficiency Erica [name]  is not at goal of 50.  Most recent Erica [name]  level was 8.1. She is on  prescription ergocalciferol  50,000 IU weekly. Lab Results  Component Value Date   VD25OH 8.1 (L) 09/13/2023    Plan: Refill prescription Erica [name]  50,000 IU weekly.    Eating disorder/emotional eating Erica [name] has had issues with stress eating, emotional  eating, nighttime eating, and boredom eating. Currently this is moderately controlled. Overall mood is stable. Denies suicidal/homicidal ideation. Medication(s): Erica [name]  50 mg BID  Plan:  Specifically regarding patient's less desirable eating habits and patterns, we employed the technique of small changes when she cannot fully commit to her prudent nutritional plan. Consider a referral to Dr. Sharron, PhD psychologist to help with eating behaviors.  Discussed distractions to curb eating behaviors. Discussed activities to do with one's hands in the evening  Be sure to get adequate rest as lack of rest can trigger appetite.  Have plan in place for stressful events.  Consider other rewards besides food.    Prediabetes Last A1c was 6.3  Medication(s):  Erica [name]  1.0 mg SQ weekly Lab Results  Component Value Date   HGBA1C 6.3 (H) 09/13/2023   Lab Results  Component Value Date   INSULIN  19.1 09/13/2023    Plan: Will minimize all refined carbohydrates both sweets and starches.  Will work on the plan and exercise.  Consider both aerobic and resistance training.  Will keep protein, water, and fiber intake high.  Increase Polyunsaturated and Monounsaturated fats to increase satiety and encourage weight loss.  Aim for 7 to 9 hours of sleep nightly.    Generalized Obesity: Current BMI BMI (Calculated): 33.22   Pharmacotherapy Plan Continue and refill  Erica [name]  1.0 mg SQ weekly  Erica [name] is currently in the action stage of change. As such, her goal is to continue with weight loss efforts.  She has agreed to the Category 2 plan.  Exercise goals: All adults should avoid inactivity. Some physical activity is better than none, and adults who participate in any amount of physical activity gain some health benefits.  Behavioral modification strategies: increasing lean protein intake, meal planning , increase water intake, better snacking choices, planning for success, increasing vegetables,  avoiding temptations, keep healthy foods in the home, measure portion sizes, and mindful eating.  Labs done today (CMP, Lipids, HgbA1c, insulin , Erica [name] ).   Erica [name] has agreed to follow-up with our clinic in 4 weeks.   Objective:   VITALS: Per patient if applicable, see vitals. GENERAL: Alert and in no acute distress. CARDIOPULMONARY: No increased WOB. Speaking in clear sentences.  PSYCH: Pleasant and cooperative. Speech normal rate and rhythm. Affect is appropriate. Insight and judgement are appropriate. Attention is focused, linear, and appropriate.  NEURO: Oriented as arrived to appointment on time with no prompting.   Attestation Statements:   This was prepared with the assistance of Engineer, civil (consulting).  Occasional wrong-word or sound-a-like substitutions may have occurred due to the inherent limitations of voice recognition   Erica Daring, DO

## 2024-02-22 LAB — COMPREHENSIVE METABOLIC PANEL WITH GFR
ALT: 15 IU/L (ref 0–32)
AST: 16 IU/L (ref 0–40)
Albumin: 4.3 g/dL (ref 3.9–4.9)
Alkaline Phosphatase: 140 IU/L — ABNORMAL HIGH (ref 44–121)
BUN/Creatinine Ratio: 10 (ref 9–23)
BUN: 8 mg/dL (ref 6–24)
Bilirubin Total: 0.3 mg/dL (ref 0.0–1.2)
CO2: 16 mmol/L — ABNORMAL LOW (ref 20–29)
Calcium: 9.4 mg/dL (ref 8.7–10.2)
Chloride: 105 mmol/L (ref 96–106)
Creatinine, Ser: 0.79 mg/dL (ref 0.57–1.00)
Globulin, Total: 2.9 g/dL (ref 1.5–4.5)
Glucose: 84 mg/dL (ref 70–99)
Potassium: 3.6 mmol/L (ref 3.5–5.2)
Sodium: 138 mmol/L (ref 134–144)
Total Protein: 7.2 g/dL (ref 6.0–8.5)
eGFR: 93 mL/min/1.73 (ref >59)

## 2024-02-22 LAB — VITAMIN D 25 HYDROXY (VIT D DEFICIENCY, FRACTURES): Vit D, 25-Hydroxy: 50.3 ng/mL (ref 30.0–100.0)

## 2024-02-22 LAB — LIPID PANEL WITH LDL/HDL RATIO
Cholesterol, Total: 164 mg/dL (ref 100–199)
HDL: 44 mg/dL (ref >39)
LDL Chol Calc (NIH): 104 mg/dL — ABNORMAL HIGH (ref 0–99)
LDL/HDL Ratio: 2.4 ratio (ref 0.0–3.2)
Triglycerides: 85 mg/dL (ref 0–149)
VLDL Cholesterol Cal: 16 mg/dL (ref 5–40)

## 2024-02-22 LAB — HEMOGLOBIN A1C
Est. average glucose Bld gHb Est-mCnc: 117 mg/dL
Hgb A1c MFr Bld: 5.7 % — ABNORMAL HIGH (ref 4.8–5.6)

## 2024-02-22 LAB — INSULIN, RANDOM: INSULIN: 12.2 u[IU]/mL (ref 2.6–24.9)

## 2024-02-24 ENCOUNTER — Ambulatory Visit: Payer: Self-pay | Admitting: Nurse Practitioner

## 2024-02-24 NOTE — Telephone Encounter (Signed)
 FYI Only or Action Required?: FYI only for provider.  Patient is followed in Pulmonology for asthma, last seen on 05/04/2023 by Erica Comer GAILS, NP.  Called Nurse Triage reporting Cough.  Symptoms began about a month ago.  Interventions attempted: Prescription medications: 2 rounds of steroids, on 2nd  round of abx without relief and Rescue inhaler.  Symptoms are: unchanged.  Triage Disposition: See HCP Within 4 Hours (Or PCP Triage)  Patient/caregiver understands and will follow disposition?: Yes      Copied from CRM #[id number]. Topic: Clinical - Red Word Triage >> Feb 24, 2024  3:45 PM Isabell A wrote: Red Word that prompted transfer to Nurse Triage: Patient states she was seen twice at Urgent care for her coughing - its currently making her breathing worse. Reason for Disposition  [1] MILD difficulty breathing (e.g., minimal/no SOB at rest, SOB with walking, pulse < 100) AND [2] still present when not coughing  Answer Assessment - Initial Assessment Questions E2C2 Pulmonary Triage - Initial Assessment Questions Chief Complaint (e.g., cough, sob, wheezing, fever, chills, sweat or additional symptoms) *Go to specific symptom protocol after initial questions. Coughing  Went to PCP 7/29 and expressed coughing concern- pt was given nasal spray without relief Reports then went to UC d/t not being able to taste/smell - was negative for flu/COVID/strep and was given amox/steroid for sinusitis - pt completes abx/steroids without relief  2nd UC visit d/t persistent sx and d/c with steroid INJ and currently on day 2/7 doxycycline   How long have symptoms been present? X1 mo  Have you tested for COVID or Flu? Note: If not, ask patient if a home test can be taken. If so, instruct patient to call back for positive results. Yes  MEDICINES:   Have you used any OTC meds to help with symptoms? Yes If yes, ask What medications? Mucinex  DM with no relief  Have you used your  inhalers/maintenance medication? Yes If yes, What medications? Levoalbuterol - reports uses 4-5x/day with minimal/no relief  If inhaler, ask How many puffs and how often? Note: Review instructions on medication in the chart. See above  OXYGEN: Do you wear supplemental oxygen? No If yes, How many liters are you supposed to use? N/a  Do you monitor your oxygen levels? No If yes, What is your reading (oxygen level) today? N/a  What is your usual oxygen saturation reading?  (Note: Pulmonary O2 sats should be 90% or greater) See chart     1. ONSET: When did the cough begin?      See above 2. SEVERITY: How bad is the cough today?      Cough won't go away 3. SPUTUM: Describe the color of your sputum (e.g., none, dry cough; clear, white, yellow, green)     dry 4. HEMOPTYSIS: Are you coughing up any blood? If Yes, ask: How much? (e.g., flecks, streaks, tablespoons, etc.)     N/a 5. DIFFICULTY BREATHING: Are you having difficulty breathing? If Yes, ask: How bad is it? (e.g., mild, moderate, severe)      Mild - with coughing  6. FEVER: Do you have a fever? If Yes, ask: What is your temperature, how was it measured, and when did it start?     denies 7. CARDIAC HISTORY: Do you have any history of heart disease? (e.g., heart attack, congestive heart failure)      denies 8. LUNG HISTORY: Do you have any history of lung disease?  (e.g., pulmonary embolus, asthma, emphysema)  asthma 9. PE RISK FACTORS: Do you have a history of blood clots? (or: recent major surgery, recent prolonged travel, bedridden)     denies 10. OTHER SYMPTOMS: Do you have any other symptoms? (e.g., runny nose, wheezing, chest pain)       Nasal congestion 11. PREGNANCY: Is there any chance you are pregnant? When was your last menstrual period?       N/a 12. TRAVEL: Have you traveled out of the country in the last month? (e.g., travel history, exposures)       denies     Scheduled patient on the next available AV appt on February 25, 2024 with alternate provider at alternate location .  Protocols used: Cough - Acute Productive-A-AH

## 2024-02-24 NOTE — Telephone Encounter (Signed)
 Pt has appt tomorrow with Madelin Stank, NP

## 2024-02-25 ENCOUNTER — Ambulatory Visit: Admitting: Adult Health

## 2024-02-25 ENCOUNTER — Encounter: Payer: Self-pay | Admitting: Adult Health

## 2024-02-25 ENCOUNTER — Ambulatory Visit (INDEPENDENT_AMBULATORY_CARE_PROVIDER_SITE_OTHER)

## 2024-02-25 VITALS — BP 120/80 | HR 85 | Temp 98.1°F | Ht 60.0 in | Wt 181.1 lb

## 2024-02-25 DIAGNOSIS — K219 Gastro-esophageal reflux disease without esophagitis: Secondary | ICD-10-CM | POA: Diagnosis not present

## 2024-02-25 DIAGNOSIS — Z720 Tobacco use: Secondary | ICD-10-CM

## 2024-02-25 DIAGNOSIS — J4551 Severe persistent asthma with (acute) exacerbation: Secondary | ICD-10-CM

## 2024-02-25 DIAGNOSIS — G4733 Obstructive sleep apnea (adult) (pediatric): Secondary | ICD-10-CM

## 2024-02-25 DIAGNOSIS — J3089 Other allergic rhinitis: Secondary | ICD-10-CM

## 2024-02-25 DIAGNOSIS — F17210 Nicotine dependence, cigarettes, uncomplicated: Secondary | ICD-10-CM

## 2024-02-25 MED ORDER — PREDNISONE 10 MG PO TABS: ORAL_TABLET | ORAL | 0 refills | Status: DC

## 2024-02-25 MED ORDER — ALBUTEROL SULFATE (2.5 MG/3ML) 0.083% IN NEBU: 2.5000 mg | INHALATION_SOLUTION | Freq: Four times a day (QID) | RESPIRATORY_TRACT | 12 refills | Status: AC | PRN

## 2024-02-25 MED ORDER — ALBUTEROL SULFATE (2.5 MG/3ML) 0.083% IN NEBU
2.5000 mg | INHALATION_SOLUTION | Freq: Once | RESPIRATORY_TRACT | Status: AC
  Administered 2024-02-25: 2.5 mg via RESPIRATORY_TRACT

## 2024-02-25 MED ORDER — OMEPRAZOLE 40 MG PO CPDR: 40.0000 mg | DELAYED_RELEASE_CAPSULE | Freq: Every day | ORAL | 1 refills | Status: DC

## 2024-02-25 MED ORDER — BENZONATATE 200 MG PO CAPS
200.0000 mg | ORAL_CAPSULE | Freq: Three times a day (TID) | ORAL | 1 refills | Status: AC | PRN
Start: 2024-02-25 — End: 2025-02-24

## 2024-02-25 MED ORDER — TRELEGY ELLIPTA 100-62.5-25 MCG/ACT IN AEPB
1.0000 | INHALATION_SPRAY | Freq: Every day | RESPIRATORY_TRACT | Status: AC
Start: 2024-02-25 — End: ?

## 2024-02-25 MED ORDER — TRELEGY ELLIPTA 100-62.5-25 MCG/ACT IN AEPB: 1.0000 | INHALATION_SPRAY | Freq: Every day | RESPIRATORY_TRACT | 5 refills | Status: AC

## 2024-02-25 NOTE — Patient Instructions (Addendum)
 Finish Doxycycline  as discussed  Prednisone  taper over next week.  Begin Trelegy 1 puff daily, rinse after use.  Xopenex  inhaler As needed   Continue on Singulair  daily  Begin Zyrtec 10mg   At bedtime   Restart Omeprazole  daily  Restart Pepcid  20mg  At bedtime   Delsym  2 tsp Twice daily  As needed  cough -over the counter Tessalon  Three times a day  As needed  cough  Follow up 2-3 weeks with Dr. Theophilus and As needed   Please contact office for sooner follow up if symptoms do not improve or worsen or seek emergency care

## 2024-02-25 NOTE — Addendum Note (Signed)
 Addended by: ROLANDA POWELL SAILOR on: 02/25/2024 12:50 PM   Modules accepted: Orders

## 2024-02-25 NOTE — Addendum Note (Signed)
 Addended by: ROLANDA POWELL SAILOR on: 02/25/2024 01:03 PM   Modules accepted: Orders

## 2024-02-25 NOTE — Progress Notes (Signed)
 @Patient  ID: Erica [name], female    DOB: 24-Apr-1976, 48 y.o.   MRN: [id number]  Chief Complaint  Patient presents with   Acute Visit   Cough   Asthma    Referring provider: Catharine Ethelene BIRCH., PA-C  HPI: 48 year old female active smoker  followed for severe persistent asthma with allergic component , allergic rhinitis, sleep apnea (CPAP intolerant)  TEST/EVENTS :  PFT 04/22/21 >> FEV1 1.95 (89%), FEV1% 90, TLC 3.71 (81%), DLCO 84%   Chest imaging:  HRCT chest 07/01/20 >> Patchy mild/mod GGO in peripheral upper lobes b/l, diffuse bronchial wall thickening. CT chest 03/21/21 >> b/l patchy opacities  PSG 07/01/20 >> AHI 8.3, SpO2 low 82%.  REM AHI 30.2. 10/17/2021 IgE 7995 12/08/2021 allergy  test >> dust mites, cat, cockroach   02/25/2024 Acute OV : Asthma  Discussed the use of AI scribe software for clinical note transcription with the patient, who gave verbal consent to proceed.  History of Present Illness Erica [name] is a 48 year old female with severe asthma and  allergies who presents with persistent cough and breathing difficulties.  She has been experiencing persistent respiratory symptoms for the past month, including a persistent cough that has not improved despite treatment. Her breathing has been erratic, necessitating frequent use of her rescue inhaler, Xopenex . She has been off her regular asthma medications, including Symbicort , for some time but continues to use montelukast  regularly. Was doing well with her Asthma until 1 month ago.   Seen at urgent care. She was initially treated with a 10-day course of Augmentin  and Medrol  dose pack  starting on February 12, 2024, but did not experience improvement. She returned to urgent care on February 21, 2024, and was prescribed doxycycline  for seven days, and given steroid shot   Despite these treatments, her symptoms, including a persistent cough and altered taste and smell, have not resolved. No fever, hemoptysis, or recent  travel.  She has a history of sleep apnea and cannot tolerate CPAP therapy. She also has a history of GERD, for which she has previously taken famotidine  and omeprazole , but she is not currently on these medications. She reports experiencing 'spells' related to GERD, which have been exacerbated by her current respiratory symptoms.  She is a smoker. She denies any recent changes at home, such as new pets, and reports no vomiting, rashes, or joint swelling. She notes soreness in one of her teeth.    Allergies  Allergen Reactions   Strawberry (Diagnostic) Swelling    Immunization History  Administered Date(s) Administered   Influenza,inj,Quad PF,6+ Mos 12/08/2019   Moderna Sars-Covid-2 Vaccination 06/05/2020, 08/29/2020   Pneumococcal Polysaccharide-23 02/04/2013    Past Medical History:  Diagnosis Date   Acid reflux    Asthma    Back pain    Eczema    Heart burn    High blood pressure    Osteoarthritis    stage 4 right hip   Pelvic inflammatory disease    SOBOE (shortness of breath on exertion)    Tubo-ovarian abscess     Tobacco History: Social History   Tobacco Use  Smoking Status Every Day   Current packs/day: 0.50   Average packs/day: 0.5 packs/day for 27.5 years (13.7 ttl pk-yrs)   Types: Cigarettes   Start date: 09/12/1996   Passive exposure: Never  Smokeless Tobacco Never  Tobacco Comments   Quit smoking 09/12/2021   Smoking 3-4 per day.  Not trying to quit.  02/25/2024 hfb RN   Ready to  quit: Not Answered Counseling given: Not Answered Tobacco comments: Quit smoking 09/12/2021 Smoking 3-4 per day.  Not trying to quit.  02/25/2024 hfb RN   Outpatient Medications Prior to Visit  Medication Sig Dispense Refill   Azelastine HCl 137 MCG/SPRAY SOLN Spray 2 sprays twice a day by intranasal route, for nasal congestion.     celecoxib (CELEBREX) 200 MG capsule Take 200 mg by mouth 2 (two) times daily.     diltiazem  (TIAZAC ) 120 MG 24 hr capsule Take 120 mg by mouth  daily.     doxycycline  (VIBRAMYCIN ) 100 MG capsule Take 100 mg by mouth 2 (two) times daily.     levalbuterol  (XOPENEX  HFA) 45 MCG/ACT inhaler Inhale 2 puffs into the lungs every 4 (four) hours as needed for wheezing. 1 each 1   montelukast  (SINGULAIR ) 10 MG tablet Take 1 tablet (10 mg total) by mouth at bedtime. 30 tablet 11   omeprazole  (PRILOSEC) 40 MG capsule Take 1 capsule (40 mg total) by mouth daily. 30 capsule 1   semaglutide -weight management (WEGOVY ) 1 MG/0.5ML SOAJ SQ injection Inject 1 mg into the skin once a week. 2 mL 0   topiramate  (TOPAMAX ) 50 MG tablet Take 1 tablet (50 mg total) by mouth 2 (two) times daily. 60 tablet 0   Vitamin D , Ergocalciferol , (DRISDOL ) 1.25 MG (50000 UNIT) CAPS capsule Take 1 capsule (50,000 Units total) by mouth every 7 (seven) days. 5 capsule 0   famotidine  (PEPCID ) 20 MG tablet One after supper (Patient not taking: Reported on 02/25/2024) 30 tablet 11   gabapentin (NEURONTIN) 300 MG capsule Take 300 mg by mouth at bedtime. (Patient not taking: Reported on 02/25/2024)     pregabalin (LYRICA) 25 MG capsule Take 25 mg by mouth 2 (two) times daily. (Patient not taking: Reported on 02/25/2024)     No facility-administered medications prior to visit.     Review of Systems:   Constitutional:   No  weight loss, night sweats,  Fevers, chills, fatigue, or  lassitude.  HEENT:   No headaches,  Difficulty swallowing,  +Tooth/dental problems, or  Sore throat,                No sneezing, itching, ear ache, +nasal congestion, post nasal drip,   CV:  No chest pain,  Orthopnea, PND, swelling in lower extremities, anasarca, dizziness, palpitations, syncope.   GI  No heartburn, indigestion, abdominal pain, nausea, vomiting, diarrhea, change in bowel habits, loss of appetite, bloody stools.   Resp:   No wheezing.  No chest wall deformity  Skin: no rash or lesions.  GU: no dysuria, change in color of urine, no urgency or frequency.  No flank pain, no hematuria    MS:  No joint pain or swelling.  No decreased range of motion.  No back pain.    Physical Exam  Pulse 85   Temp 98.1 F (36.7 C) (Oral)   LMP 06/17/2013   SpO2 99% Comment: RA  GEN: A/Ox3; pleasant , NAD, well nourished    HEENT:  [place]/AT,   NOSE-clear, THROAT-clear, no lesions, no postnasal drip or exudate noted.   NECK:  Supple w/ fair ROM; no JVD; normal carotid impulses w/o bruits; no thyromegaly or nodules palpated; no lymphadenopathy.    RESP  speaks in full sentences , +wheezing  no accessory muscle use, no dullness to percussion  CARD:  RRR, no m/r/g, no peripheral edema, pulses intact, no cyanosis or clubbing.  GI:   Soft & nt; nml bowel  sounds; no organomegaly or masses detected.   Musco: Warm bil, no deformities or joint swelling noted.   Neuro: alert, no focal deficits noted.    Skin: Warm, no lesions or rashes    Lab Results:    BNP   ProBNP No results found for: PROBNP  Imaging: No results found.  Administration History     None          Latest Ref Rng & Units 04/22/2021   10:51 AM  PFT Results  FVC-Predicted Pre % 84   FVC-Post L 2.17   FVC-Predicted Post % 81   Pre FEV1/FVC % % 83   Post FEV1/FCV % % 90   FEV1-Pre L 1.85   FEV1-Predicted Pre % 85   FEV1-Post L 1.95   DLCO uncorrected ml/min/mmHg 16.26   DLCO UNC% % 84   DLCO corrected ml/min/mmHg 16.63   DLCO COR %Predicted % 86   DLVA Predicted % 113   TLC L 3.71   TLC % Predicted % 81   RV % Predicted % 98     No results found for: NITRICOXIDE      Assessment & Plan:   No problem-specific Assessment & Plan notes found for this encounter.   Assessment and Plan Assessment & Plan Asthma Bronchitis with acute exacerbation  She is experiencing a severe asthma exacerbation with asthmatic bronchitis, likely triggered by a sinus infection, allergies, or viral illness. Symptoms include persistent cough, altered taste, and breathing difficulties. Chest x-ray today  shows no acute process .  She has been off asthma medications, contributing to the exacerbation. A previous steroid shot was given on the 18th, so prednisone  tablets will be started instead of another shot. Initiate a prednisone  taper today, taking it with food. Continue the doxycycline  course. Prescribe a Trelegy inhaler for once-daily use, instructing her to rinse her mouth after use. Prescribe tessalon  use up to three times daily and recommend over-the-counter options like Mucinex  DM or Robitussin. Provide an albuterol  refill for the nebulizer. Albuterol  neb given in office with perceived benefit  Chronic allergic rhinitis   Chronic allergic rhinitis is contributing to her respiratory symptoms. There have been no recent changes in her home environment or new pets. Recommend starting an over-the-counter allergy  medication like Zyrtec or Claritin.  Obstructive sleep apnea, CPAP intolerance   She has obstructive sleep apnea with CPAP intolerance.  Tobacco use disorder   Continued smoking is contributing to her respiratory issues. Advise her to reduce smoking to improve asthma control.  Gastroesophageal reflux disease (GERD)   GERD symptoms are exacerbated by her persistent cough. She is not currently on omeprazole  or famotidine . Prescribe omeprazole  for reflux and recommend continuing famotidine  at night until symptoms improve.  Plan  Patient Instructions  Finish Doxycycline  as discussed  Prednisone  taper over next week.  Begin Trelegy 1 puff daily, rinse after use.  Xopenex  inhaler As needed   Continue on Singulair  daily  Begin Zyrtec 10mg   At bedtime   Restart Omeprazole  daily  Restart Pepcid  20mg  At bedtime   Delsym  2 tsp Twice daily  As needed  cough -over the counter Tessalon  Three times a day  As needed  cough  Follow up 2-3 weeks with Dr. Theophilus and As needed   Please contact office for sooner follow up if symptoms do not improve or worsen or seek emergency care       I spent   43  minutes dedicated to the care of this patient on the date of  this encounter to include pre-visit review of records, face-to-face time with the patient discussing conditions above, post visit ordering of testing, clinical documentation with the electronic health record, making appropriate referrals as documented, and communicating necessary findings to members of the patients care team.   Madelin Stank, NP 02/25/2024

## 2024-02-28 NOTE — Therapy (Signed)
 OUTPATIENT PHYSICAL THERAPY THORACOLUMBAR EVALUATION   Patient Name: Erica [name] MRN: [id number] DOB:[birth year], 48 y.o., female Today's Date: 02/29/2024  END OF SESSION:  PT End of Session - 02/29/24 1019     Visit Number 1    Number of Visits 10    Date for PT Re-Evaluation 04/11/24    Authorization Type [place] MEDICAID PREPAID HEALTH PLAN [place] MEDICAID [employer] [place]    Authorization Time Period 27 visits a year PT/OT and ST with no auth; needs auth after 27    Authorization - Visit Number 14   used 14 already this year   Authorization - Number of Visits 27    Progress Note Due on Visit 27    PT Start Time 1020    PT Stop Time 1105    PT Time Calculation (min) 45 min    Activity Tolerance Patient tolerated treatment well    Behavior During Therapy WFL for tasks assessed/performed          Past Medical History:  Diagnosis Date   Acid reflux    Asthma    Back pain    Eczema    Heart burn    High blood pressure    Osteoarthritis    stage 4 right hip   Pelvic inflammatory disease    SOBOE (shortness of breath on exertion)    Tubo-ovarian abscess    Past Surgical History:  Procedure Laterality Date   ABDOMINAL HYSTERECTOMY N/A 07/07/2013   Procedure: HYSTERECTOMY ABDOMINAL with bilateral salpingo-oophorectomy;  Surgeon: Agent KANDICE Solomons, MD;  Location: [facility];  Service: Gynecology;  Laterality: N/A;   CESAREAN SECTION     DILATION AND EVACUATION N/A 02/03/2013   Procedure: DILATATION AND EVACUATION;  Surgeon: Agent KANDICE Solomons, MD;  Location: [facility];  Service: Gynecology;  Laterality: N/A;   TOTAL HIP ARTHROPLASTY     Patient Active Problem List   Diagnosis Date Noted   Prediabetes 09/14/2023   Vitamin D  deficiency 09/14/2023   Elevated cholesterol 09/14/2023   Mild intermittent allergic asthma without complication 05/04/2023   GERD (gastroesophageal reflux disease) 05/04/2023   Preoperative respiratory examination 05/04/2023   Atypical asthma ? VCD  08/19/2021   Cigarette smoker 08/19/2021   Tobacco use disorder 01/23/2021   Chronic PID (chronic pelvic inflammatory disease) 07/07/2013   Right tubo-ovarian abscess 04/20/2013   Pelvic abscess in female 02/08/2013    PCP: Catharine Ethelene BIRCH, PA-C  REFERRING PROVIDER: Corie Sherlean Caldron, PA-C  REFERRING DIAG: [phone] (ICD-10-CM) - Spondylosis without myelopathy or radiculopathy, lumbar region  Rationale for Evaluation and Treatment: Rehabilitation  THERAPY DIAG:  Low back pain, unspecified back pain laterality, unspecified chronicity, unspecified whether sciatica present  Impaired functional mobility, balance, gait, and endurance  Difficulty in walking, not elsewhere classified  ONSET DATE: about a year  SUBJECTIVE:  SUBJECTIVE STATEMENT: Back pain for about a year even prior to right hip replacement.  Has a leg length discrepancy and will be going to [facility] for a shoe lift.  X-rays and MRI of the back done and has some degenerative changes of the spine and spondylosis; nerve compression.  Going to try injections once done with therapy  PERTINENT HISTORY:  R THA on 10/28/23 3 C-sections Total hysterectomy R leg length discrepancy longer 5/8 of an inch  PAIN:  Are you having pain? Yes: NPRS scale: 6/10 now, 9/10 at worst, 3/10 at best Pain location: middle and across the back Pain description: sore and achey Aggravating factors: movent; prolonged activity Relieving factors: nothing seems to touch it  PRECAUTIONS: None  RED FLAGS: None   WEIGHT BEARING RESTRICTIONS: No  FALLS:  Has patient fallen in last 6 months? No    OCCUPATION: has return to work as a Lawyer; 12 hour shifts   PLOF: Independent  PATIENT GOALS: be able to manage the pain  NEXT MD VISIT: PRN  OBJECTIVE:   Note: Objective measures were completed at Evaluation unless otherwise noted.  DIAGNOSTIC FINDINGS:  MRI and x-ray cannot see in Epic  PATIENT SURVEYS:  Modified Oswestry:  MODIFIED OSWESTRY DISABILITY SCALE  Date: 02/29/24 Score                                Total 26/50; 52%   Interpretation of scores: Score Category Description  0-20% Minimal Disability The patient can cope with most living activities. Usually no treatment is indicated apart from advice on lifting, sitting and exercise  21-40% Moderate Disability The patient experiences more pain and difficulty with sitting, lifting and standing. Travel and social life are more difficult and they may be disabled from work. Personal care, sexual activity and sleeping are not grossly affected, and the patient can usually be managed by conservative means  41-60% Severe Disability Pain remains the main problem in this group, but activities of daily living are affected. These patients require a detailed investigation  61-80% Crippled Back pain impinges on all aspects of the patient's life. Positive intervention is required  81-100% Bed-bound  These patients are either bed-bound or exaggerating their symptoms  Bluford FORBES Zoe DELENA Karon DELENA, et al. Surgery versus conservative management of stable thoracolumbar fracture: the PRESTO feasibility RCT. [place]): [employer]; 2021 Nov. [facility] Technology Assessment, No. 25.62.) Appendix 3, Oswestry Disability Index category descriptors. Available from: FindJewel[employer].cz  Minimally Clinically Important Difference (MCID) = 12.8%  COGNITION: Overall cognitive status: Within functional limits for tasks assessed     SENSATION: Reports numbness right anterior thigh from THA   POSTURE: rounded shoulders, forward head, and increased lumbar lordosis  PALPATION: Tender right hip and glute  LUMBAR ROM: *painful  AROM eval  Flexion Fingertips  to mid shin* pulling  Extension 20% available *  Right lateral flexion   Left lateral flexion   Right rotation   Left rotation    (Blank rows = not tested)  LOWER EXTREMITY ROM:     Active  Right eval Left eval  Hip flexion    Hip extension    Hip abduction    Hip adduction    Hip internal rotation    Hip external rotation    Knee flexion    Knee extension    Ankle dorsiflexion    Ankle plantarflexion    Ankle inversion    Ankle  eversion     (Blank rows = not tested)  LOWER EXTREMITY MMT:    MMT Right eval Left eval  Hip flexion 3- 5  Hip extension    Hip abduction    Hip adduction    Hip internal rotation    Hip external rotation    Knee flexion    Knee extension 4+ 5  Ankle dorsiflexion 4+ 5  Ankle plantarflexion    Ankle inversion    Ankle eversion     (Blank rows = not tested)    FUNCTIONAL TESTS:  5 times sit to stand: 12.81 sec no UE assist  GAIT: Distance walked: 60 ft  Assistive device utilized: None Level of assistance: Modified independence Comments: slight antalgic gait  TREATMENT DATE: 02/29/24 physical therapy evaluation and HEP instruction                                                                                                                                 PATIENT EDUCATION:  Education details: Patient educated on exam findings, POC, scope of PT, HEP, and discussed aquatic therapy. Person educated: Patient Education method: Explanation, Demonstration, and Handouts Education comprehension: verbalized understanding, returned demonstration, verbal cues required, and tactile cues required  HOME EXERCISE PROGRAM: Decompression exercises 1-5  ASSESSMENT:  CLINICAL IMPRESSION: Patient is a 48 y.o. female who was seen today for physical therapy evaluation and treatment for M47.816 (ICD-10-CM) - Spondylosis without myelopathy or radiculopathy, lumbar region.  Patient demonstrates muscle weakness, reduced ROM, and fascial  restrictions which are likely contributing to symptoms of pain and are negatively impacting patient ability to perform ADLs and functional mobility tasks. Patient will benefit from skilled physical therapy services to address these deficits to reduce pain and improve level of function with ADLs and functional mobility tasks.   OBJECTIVE IMPAIRMENTS: Abnormal gait, decreased activity tolerance, difficulty walking, increased fascial restrictions, impaired perceived functional ability, and pain.   ACTIVITY LIMITATIONS: carrying, lifting, bending, sitting, standing, squatting, sleeping, stairs, transfers, locomotion level, and caring for others  PARTICIPATION LIMITATIONS: meal prep, cleaning, laundry, shopping, community activity, and occupation  PERSONAL FACTORS: recent right THA with leg length discrepency are also affecting patient's functional outcome.   REHAB POTENTIAL: Good  CLINICAL DECISION MAKING: Evolving/moderate complexity  EVALUATION COMPLEXITY: Moderate   GOALS: Goals reviewed with patient? No  SHORT TERM GOALS: Target date: 03/21/24  patient will be independent with initial HEP  Baseline: Goal status: INITIAL  2.  Patient will report 30% improvement overall  Baseline:  Goal status: INITIAL   LONG TERM GOALS: Target date: 04/11/2024  Patient will be independent in self management strategies to improve quality of life and functional outcomes.  Baseline:  Goal status: INITIAL  2.  Patient will report 50% improvement overall  Baseline:  Goal status: INITIAL  3.  Patient will improve Modified Oswestry score by 8 points to demonstrate improved perceived function  Baseline: 26/50 Goal status: INITIAL  4.  Patient will improve 5 times sit to stand score to 11 sec or less to demonstrate improved functional mobility and increased leg strength.    Baseline: 12.81 sec Goal status: INITIAL  PLAN:  PT FREQUENCY: 1-2x/week  PT DURATION: 6 weeks  PLANNED  INTERVENTIONS: 97164- PT Re-evaluation, 97110-Therapeutic exercises, 97530- Therapeutic activity, 97112- Neuromuscular re-education, 97535- Self Care, [id number]- Manual therapy, [id number]- Gait training, [phone]- Orthotic Fit/training, [phone]- Canalith repositioning, [id number]- Aquatic Therapy, [phone]- Splinting, [phone]- Wound care (first 20 sq cm), 97598- Wound care (each additional 20 sq cm)Patient/Family education, Balance training, Stair training, Taping, Dry Needling, Joint mobilization, Joint manipulation, Spinal manipulation, Spinal mobilization, Scar mobilization, and DME instructions. SABRA  PLAN FOR NEXT SESSION: Review HEP and goals; perform 2 MWT.  Decompression exercises and postural strengthening.  Patient with an appt at [facility] next month to address leg length difference.     11:09 AM, 02/29/24 Kreston Ahrendt Small Ryon Layton MPT [place] [facility] [place] [phone]

## 2024-02-29 ENCOUNTER — Telehealth: Payer: Self-pay

## 2024-02-29 ENCOUNTER — Ambulatory Visit (HOSPITAL_COMMUNITY): Attending: Physician Assistant

## 2024-02-29 ENCOUNTER — Other Ambulatory Visit: Payer: Self-pay

## 2024-02-29 DIAGNOSIS — M545 Low back pain, unspecified: Secondary | ICD-10-CM | POA: Diagnosis present

## 2024-02-29 DIAGNOSIS — Z7409 Other reduced mobility: Secondary | ICD-10-CM | POA: Insufficient documentation

## 2024-02-29 DIAGNOSIS — R262 Difficulty in walking, not elsewhere classified: Secondary | ICD-10-CM | POA: Insufficient documentation

## 2024-02-29 NOTE — Telephone Encounter (Signed)
 Copied from CRM #[id number]. Topic: Clinical - Medication Prior Auth >> Feb 29, 2024 10:25 AM Leila BROCKS wrote: Reason for CRM: Patient [phone] states PA is needed for Fluticasone -Umeclidin-Vilant (TRELEGY ELLIPTA ) 100-62.5-25 MCG/ACT AEPB was sent from [facility] [place] [place] -[address] [place] [address] Phone: [phone] Fax: [phone] last week and will resend today again. Please call patient if needed.   Please advise PA for Trelegy inhaler.

## 2024-03-01 ENCOUNTER — Other Ambulatory Visit (HOSPITAL_COMMUNITY): Payer: Self-pay

## 2024-03-01 NOTE — Telephone Encounter (Signed)
 Pharmacy Patient Advocate Encounter  Received notification from [facility] MEDICA[employer] that Prior Authorization for Trelegy 100 has been APPROVED from 03/01/24 to 03/01/25   PA #/Case ID/Reference #: [id number]

## 2024-03-01 NOTE — Telephone Encounter (Signed)
 Pharmacy Patient Advocate Encounter   Received notification from Physician's Office that prior authorization for Trelegy is required/requested.   Insurance verification completed.   The patient is insured through [facility] MEDICAID .   Per test claim: PA required; PA submitted to above mentioned insurance via Latent Key/confirmation #/EOC [id number] Status is pending

## 2024-03-07 ENCOUNTER — Ambulatory Visit (HOSPITAL_COMMUNITY)

## 2024-03-14 ENCOUNTER — Ambulatory Visit (HOSPITAL_COMMUNITY): Admitting: Physical Therapy

## 2024-03-16 ENCOUNTER — Ambulatory Visit: Admitting: Orthopedic Surgery

## 2024-03-17 ENCOUNTER — Encounter (HOSPITAL_COMMUNITY)

## 2024-03-22 ENCOUNTER — Encounter (HOSPITAL_COMMUNITY): Admitting: Physical Therapy

## 2024-03-28 ENCOUNTER — Encounter: Payer: Self-pay | Admitting: Bariatrics

## 2024-03-28 ENCOUNTER — Ambulatory Visit: Admitting: Bariatrics

## 2024-03-28 VITALS — BP 131/89 | HR 85 | Temp 98.0°F | Ht 60.5 in | Wt 174.0 lb

## 2024-03-28 DIAGNOSIS — E559 Vitamin D deficiency, unspecified: Secondary | ICD-10-CM

## 2024-03-28 DIAGNOSIS — E6609 Other obesity due to excess calories: Secondary | ICD-10-CM

## 2024-03-28 DIAGNOSIS — E669 Obesity, unspecified: Secondary | ICD-10-CM

## 2024-03-28 DIAGNOSIS — F5089 Other specified eating disorder: Secondary | ICD-10-CM | POA: Diagnosis not present

## 2024-03-28 DIAGNOSIS — Z6833 Body mass index (BMI) 33.0-33.9, adult: Secondary | ICD-10-CM | POA: Diagnosis not present

## 2024-03-28 DIAGNOSIS — R632 Polyphagia: Secondary | ICD-10-CM | POA: Diagnosis not present

## 2024-03-28 MED ORDER — TIRZEPATIDE-WEIGHT MANAGEMENT 5 MG/0.5ML ~~LOC~~ SOAJ
5.0000 mg | SUBCUTANEOUS | 0 refills | Status: DC
Start: 2024-03-28 — End: 2024-05-24

## 2024-03-28 MED ORDER — VITAMIN D (ERGOCALCIFEROL) 1.25 MG (50000 UNIT) PO CAPS: 50000.0000 [IU] | ORAL_CAPSULE | ORAL | 0 refills | Status: DC

## 2024-03-28 MED ORDER — TOPIRAMATE 50 MG PO TABS
ORAL_TABLET | ORAL | 0 refills | Status: DC
Start: 2024-03-28 — End: 2024-04-04

## 2024-03-28 NOTE — Progress Notes (Signed)
 WEIGHT SUMMARY AND BIOMETRICS  Weight Lost Since Last Visit: 0  Weight Gained Since Last Visit: 1lb   Vitals Temp: 98 F (36.7 C) BP: 131/89 Pulse Rate: 85 SpO2: 100 %   Anthropometric Measurements Height: 5' 0.5 (1.537 m) Weight: 174 lb (78.9 kg) BMI (Calculated): 33.41 Weight at Last Visit: 173lb Weight Lost Since Last Visit: 0 Weight Gained Since Last Visit: 1lb Starting Weight: 201lb Total Weight Loss (lbs): 27 lb (12.2 kg)   Body Composition  Body Fat %: 43.5 % Fat Mass (lbs): 75.8 lbs Muscle Mass (lbs): 93.4 lbs Total Body Water (lbs): 69.6 lbs Visceral Fat Rating : 10   Other Clinical Data Fasting: no Labs: no Today's Visit #: 9 Starting Date: 09/13/23    OBESITY Erica [name] is here to discuss her progress with her obesity treatment plan along with follow-up of her obesity related diagnoses.    Nutrition Plan: the Category 2 plan - 70% adherence.  Current exercise: walking  Interim History:  She is up 1 lb since her last visit.  Eating all of the food on the plan., Protein intake is as prescribed, Is not skipping meals, Not journaling consistently., and Water intake is adequate.   Pharmacotherapy: Erica [name] is on Wegovy  1.0 mg SQ weekly Adverse side effects: None Hunger is moderately controlled.  Cravings are moderately controlled.  Assessment/Plan:   Vitamin D  Deficiency Vitamin D  is at goal of 50.  Most recent vitamin D  level was 50.3. She is on  prescription ergocalciferol  50,000 IU weekly. Lab Results  Component Value Date   VD25OH 50.3 02/21/2024   VD25OH 8.1 (L) 09/13/2023    Plan: Refill prescription vitamin D  50,000 IU weekly.    Polyphagia Erica [name] endorses excessive hunger.  Medication(s): Wegovy  Effects of medication (appetite) :  moderately controlled. Cravings are moderately controlled.    Plan: Medication(s): Will change to  Erica [name]  5.0 mg SQ weekly to better control hunger.  Will increase water, protein and fiber to help assuage hunger.  Will minimize foods that have a high glucose index/load to minimize reactive hypoglycemia.   Eating disorder/emotional eating Erica [name] has had issues with stress eating, emotional eating, and boredom eating. Currently this is moderately controlled. She is having more cravings in the evenings. Overall mood is stable. Denies suicidal/homicidal ideation. Medication(s): Topamax  50 mg BID  Plan: Increase Topamax  from 50 mg Bid to Topamax  50 mg 1 in the am and 2 in the pm.  Motivational interviewing as well as evidence-based interventions for health behavior change were utilized today including the discussion of self monitoring techniques, problem-solving barriers and SMART goal setting techniques.  Discussed distractions to curb eating behaviors. Discussed activities to Erica [name] with one's hands in the evening  Be sure to get adequate rest as lack of rest can trigger appetite.  Have plan in place for stressful events.  Consider other rewards besides food.  Generalized Obesity: Current BMI BMI (Calculated): 33.41   Pharmacotherapy Plan Start  Erica [name]  5.0 mg SQ weekly  Erica [name] is currently in the action stage of change. As such, her goal is to continue with weight loss efforts.  She has agreed to the Category 2 plan.  Exercise goals: All adults should avoid inactivity. Some physical activity is better than none, and adults who participate in any amount of physical activity gain some health benefits.  Behavioral modification strategies: increasing lean protein intake, meal planning , increase water intake, increasing vegetables, and keep healthy foods in the home.  Erica [name] has agreed to follow-up with our clinic in 4 weeks.      Objective:   VITALS: Per patient if applicable, see vitals. GENERAL: Alert and in no acute  distress. CARDIOPULMONARY: No increased WOB. Speaking in clear sentences.  PSYCH: Pleasant and cooperative. Speech normal rate and rhythm. Affect is appropriate. Insight and judgement are appropriate. Attention is focused, linear, and appropriate.  NEURO: Oriented as arrived to appointment on time with no prompting.   Attestation Statements:   This was prepared with the assistance of Engineer, civil (consulting).  Occasional wrong-word or sound-a-like substitutions may have occurred due to the inherent limitations of voice recognition   Erica [name], Erica [name]

## 2024-03-29 ENCOUNTER — Telehealth (INDEPENDENT_AMBULATORY_CARE_PROVIDER_SITE_OTHER): Payer: Self-pay | Admitting: *Deleted

## 2024-03-29 ENCOUNTER — Encounter (HOSPITAL_COMMUNITY): Admitting: Physical Therapy

## 2024-03-29 NOTE — Telephone Encounter (Signed)
 PA SUBMITTED VIA COVERMYMEDS (Key: [id number])  MEDICATION DENIED AND PATIENT NOTIFIED VIA MYCHART MESSAGE.

## 2024-03-31 ENCOUNTER — Encounter (HOSPITAL_COMMUNITY)

## 2024-04-03 ENCOUNTER — Telehealth: Payer: Self-pay | Admitting: Bariatrics

## 2024-04-03 NOTE — Telephone Encounter (Signed)
 Spoke to patient and let her know that her Zepbound  was denied per her insurance. Patient stated she would like to come into the office for a sooner visit to discuss other medication options. Rescheduled follow-up visit.

## 2024-04-03 NOTE — Telephone Encounter (Signed)
 Pt is calling about PA. She have not heard anything regarding her RX.

## 2024-04-04 ENCOUNTER — Encounter: Payer: Self-pay | Admitting: Bariatrics

## 2024-04-04 ENCOUNTER — Ambulatory Visit: Admitting: Bariatrics

## 2024-04-04 ENCOUNTER — Encounter (HOSPITAL_COMMUNITY): Admitting: Physical Therapy

## 2024-04-04 VITALS — BP 126/89 | HR 94 | Ht 60.5 in | Wt 172.0 lb

## 2024-04-04 DIAGNOSIS — E559 Vitamin D deficiency, unspecified: Secondary | ICD-10-CM | POA: Diagnosis not present

## 2024-04-04 DIAGNOSIS — E669 Obesity, unspecified: Secondary | ICD-10-CM

## 2024-04-04 DIAGNOSIS — E6609 Other obesity due to excess calories: Secondary | ICD-10-CM

## 2024-04-04 DIAGNOSIS — R632 Polyphagia: Secondary | ICD-10-CM

## 2024-04-04 DIAGNOSIS — Z6833 Body mass index (BMI) 33.0-33.9, adult: Secondary | ICD-10-CM

## 2024-04-04 MED ORDER — PHENTERMINE-TOPIRAMATE ER 3.75-23 MG PO CP24: ORAL_CAPSULE | ORAL | 0 refills | Status: DC

## 2024-04-04 MED ORDER — PHENTERMINE-TOPIRAMATE ER 7.5-46 MG PO CP24: ORAL_CAPSULE | ORAL | 0 refills | Status: DC

## 2024-04-04 NOTE — Progress Notes (Addendum)
 WEIGHT SUMMARY AND BIOMETRICS  Weight Lost Since Last Visit: 2lb  Weight Gained Since Last Visit: 0   Vitals Temp: -- (could not obtain) BP: 126/89 Pulse Rate: 94 SpO2: 99 %   Anthropometric Measurements Height: 5' 0.5 (1.537 m) Weight: 172 lb (78 kg) BMI (Calculated): 33.03 Weight at Last Visit: 173lb Weight Lost Since Last Visit: 2lb Weight Gained Since Last Visit: 0 Starting Weight: 201lb Total Weight Loss (lbs): 29 lb (13.2 kg)   Body Composition  Body Fat %: 42.8 % Fat Mass (lbs): 74 lbs Muscle Mass (lbs): 93.8 lbs Total Body Water (lbs): 68.8 lbs Visceral Fat Rating : 10   Other Clinical Data Fasting: no Labs: no Today's Visit #: 10 Starting Date: 09/13/23    OBESITY Erica [name] is here to discuss her progress with her obesity treatment plan along with follow-up of her obesity related diagnoses.    Nutrition Plan: the Category 2 plan - 70% adherence.  Current exercise: walking  Interim History:  She is here to discuss options as her insurance will not pay for her GLP-1 anymore. She will start Zepbound  OTC.  Eating all of the food on the plan., Protein intake is as prescribed, Is skipping meals, Not journaling consistently., and Water intake is adequate.   Pharmacotherapy: Erica [name] is on Zepbound  5.0 mg SQ weekly (will be discontinued due to lack of coverage after 04/05/24)  Adverse side effects: None Hunger is moderately controlled.  Cravings are moderately controlled.  Assessment/Plan:    Polyphagia Erica [name] endorses excessive hunger.  Medication(s): Zepbound  Effects of medication:  moderately controlled. Cravings are moderately controlled.   Plan: Medication(s): Qsymia 7.5/46 mg 1 capsule by mouth daily in am, lower initial dose sent as well.  Stop Topamax  RX.  Will increase water, protein and fiber to help assuage hunger.   Will minimize foods that have a high glucose index/load to minimize reactive hypoglycemia.   Vitamin D  Deficiency Vitamin D  is at goal of 50.  Most recent vitamin D  level was 50.3. She is on  prescription ergocalciferol  50,000 IU weekly. Lab Results  Component Value Date   VD25OH 50.3 02/21/2024   VD25OH 8.1 (L) 09/13/2023    Plan: Continue prescription vitamin D  50,000 IU weekly.     Generalized Obesity: Current BMI BMI (Calculated): 33.03   Pharmacotherapy Plan Start  Qsymia 7.5/46 mg 1 capsule by mouth daily in am, start with lower starting dose 3.75/23 for 14 days.   Erica [name] is currently in the action stage of change. As such, her goal is to continue with weight loss efforts.  She has agreed to the Category 2 plan.  Exercise goals: All adults should avoid inactivity. Some physical activity is better than none, and adults who participate in any amount of physical activity gain some health benefits.  Behavioral modification strategies: increasing lean protein intake, no meal skipping, meal planning , increase  water intake, better snacking choices, planning for success, avoiding temptations, keep healthy foods in the home, weigh protein portions, measure portion sizes, and mindful eating.  Erica [name] has agreed to follow-up with our clinic in 4 weeks.  Objective:   VITALS: Per patient if applicable, see vitals. GENERAL: Alert and in no acute distress. CARDIOPULMONARY: No increased WOB. Speaking in clear sentences.  PSYCH: Pleasant and cooperative. Speech normal rate and rhythm. Affect is appropriate. Insight and judgement are appropriate. Attention is focused, linear, and appropriate.  NEURO: Oriented as arrived to appointment on time with no prompting.   Attestation Statements:   This was prepared with the assistance of Engineer, civil (consulting).  Occasional wrong-word or sound-a-like substitutions may have occurred due to the inherent limitations of voice recognition    Clayborne Daring, DO

## 2024-04-06 ENCOUNTER — Encounter (HOSPITAL_COMMUNITY): Admitting: Physical Therapy

## 2024-04-11 ENCOUNTER — Encounter (INDEPENDENT_AMBULATORY_CARE_PROVIDER_SITE_OTHER): Payer: Self-pay | Admitting: *Deleted

## 2024-04-20 ENCOUNTER — Ambulatory Visit: Admitting: Orthopedic Surgery

## 2024-04-20 ENCOUNTER — Other Ambulatory Visit (INDEPENDENT_AMBULATORY_CARE_PROVIDER_SITE_OTHER): Payer: Self-pay

## 2024-04-20 VITALS — BP 131/91 | HR 97 | Ht 60.5 in | Wt 172.0 lb

## 2024-04-20 DIAGNOSIS — M545 Low back pain, unspecified: Secondary | ICD-10-CM

## 2024-04-20 NOTE — Progress Notes (Signed)
 Orthopedic Spine Surgery Office Note  Assessment: Patient is a 48 y.o. female with 2 issues:  1.  Chronic, progressive low back pain -suspect facet arthropathy as etiology 2.  Left hip pain, x-rays without significant degenerative change she has, positive FABER, possible labral tear   Plan: -Explained that initially conservative treatment is tried as a significant number of patients may experience relief with these treatment modalities. Discussed that the conservative treatments include:  -activity modification  -physical therapy  -over the counter pain medications  -medrol  dosepak  -lumbar steroid injections -Patient has tried Tylenol , Celebrex, prednisone   -Recommended diagnostic/therapeutic facet injections at L4/5.  Referral provided to her today -We decided to workup and treat her back first and then we can revisit her hip pain -Would need to be nicotine  free prior to any elective spine surgery -Patient should return to office in 5-6 weeks, x-rays at next visit: none   Patient expressed understanding of the plan and all questions were answered to the patient's satisfaction.   ___________________________________________________________________________   History:  Patient is a 48 y.o. female who presents today for lumbar spine and right hip pain.  Patient has had several years of low back pain.  She has no pain that radiates into either lower extremity.  She notes the pain has gotten progressively worse with time.  She feels that around the belt line.  Within the last couple of months, she is also developed left groin pain.  She feels it with rotation at the hip or weightbearing.  She does not have the groin pain when she is sitting or lying down.  There is no trauma or injury that preceded the onset of either of these pains.  Rates the pain as an 8 out of 10 at its worst.   Weakness: Denies Symptoms of imbalance: Denies Paresthesias and numbness: Denies Bowel or bladder  incontinence: Denies Saddle anesthesia: Denies  Treatments tried: Tylenol , Celebrex, prednisone   Review of systems: Denies fevers and chills, night sweats, unexplained weight loss, history of cancer.  Has had pain that wakes her at night  Past medical history: HTN GERD  Allergies: NKDA  Past surgical history:  Right THA Hysterectomy C-section x 3  Social history: Reports use of nicotine  product (smoking, vaping, patches, smokeless) Alcohol use: Yes, less than 1 drink per week Denies recreational drug use   Physical Exam:  BMI of 33.0  General: no acute distress, appears stated age Neurologic: alert, answering questions appropriately, following commands Respiratory: unlabored breathing on room air, symmetric chest rise Psychiatric: appropriate affect, normal cadence to speech   MSK (spine):  -Strength exam      Left  Right EHL    5/5  5/5 TA    5/5  5/5 GSC    5/5  5/5 Knee extension  5/5  5/5 Hip flexion   5/5  5/5  -Sensory exam    Sensation intact to light touch in L3-S1 nerve distributions of bilateral lower extremities  -Achilles DTR: 1/4 on the left, 1/4 on the right -Patellar tendon DTR: 1/4 on the left, 1/4 on the right  -Straight leg raise: Negative bilaterally -Clonus: no beats bilaterally  -Left hip exam: no pain through range of motion -Right hip exam: Positive FADIR, pain with internal rotation past 0 degrees, negative FABER  Imaging: XRs of the lumbar spine from 04/20/2024 were independently reviewed and interpreted, showing disc height loss at L4/5.  No other significant degenerative changes seen.  No evidence of instability on flexion/extension views.  No fracture or dislocation seen.  MRI of the lumbar spine from 01/29/2024 was independently reviewed and interpreted, showing right sided foraminal stenosis at L4/5.  No other significant stenosis seen.  Disc desiccation at L4/5.   Patient name: Erica [name] Patient MRN: [id number] Date  of visit: 04/20/24

## 2024-04-26 ENCOUNTER — Ambulatory Visit: Admitting: Bariatrics

## 2024-05-01 ENCOUNTER — Other Ambulatory Visit: Payer: Self-pay | Admitting: Physical Medicine and Rehabilitation

## 2024-05-01 MED ORDER — DIAZEPAM 5 MG PO TABS: ORAL_TABLET | ORAL | 0 refills | Status: DC

## 2024-05-08 ENCOUNTER — Encounter: Payer: Self-pay | Admitting: Radiology

## 2024-05-18 ENCOUNTER — Other Ambulatory Visit: Payer: Self-pay

## 2024-05-18 ENCOUNTER — Ambulatory Visit: Admitting: Physical Medicine and Rehabilitation

## 2024-05-18 VITALS — BP 135/91 | HR 105

## 2024-05-18 DIAGNOSIS — M47816 Spondylosis without myelopathy or radiculopathy, lumbar region: Secondary | ICD-10-CM

## 2024-05-18 MED ORDER — METHYLPREDNISOLONE ACETATE 40 MG/ML IJ SUSP
40.0000 mg | Freq: Once | INTRAMUSCULAR | Status: AC
  Administered 2024-05-18: 40 mg

## 2024-05-18 NOTE — Progress Notes (Signed)
 Pain Scale   Average Pain 7 Patient advising she has chronic lower back pain radiating to right side at times. Patient advising her lower back pain is constant without relief        +Driver, -BT, -Dye Allergies.

## 2024-05-18 NOTE — Progress Notes (Signed)
 Erica [name] - 48 y.o. female MRN [id number]  Date of birth: 11/15/1975  Office Visit Note: Visit Date: 05/18/2024 PCP: Catharine Ethelene BIRCH., PA-C Referred by: Georgina Ozell LABOR, MD  Subjective: Chief Complaint  Patient presents with   Lower Back - Pain   HPI:  Erica [name] is a 48 y.o. female who comes in today at the request of Dr. Ozell Georgina for planned Bilateral  L4-5 Lumbar facet/medial branch block with fluoroscopic guidance.  The patient has failed conservative care including home exercise, medications, time and activity modification.  This injection will be diagnostic and hopefully therapeutic.  Please see requesting physician notes for further details and justification.  Exam has shown concordant pain with facet joint loading.   ROS Otherwise per HPI.  Assessment & Plan: Visit Diagnoses:    ICD-10-CM   1. Spondylosis without myelopathy or radiculopathy, lumbar region  M47.816 XR C-ARM NO REPORT    Facet Injection    methylPREDNISolone  acetate (DEPO-MEDROL ) injection 40 mg      Plan: No additional findings.   Meds & Orders:  Meds ordered this encounter  Medications   methylPREDNISolone  acetate (DEPO-MEDROL ) injection 40 mg    Orders Placed This Encounter  Procedures   Facet Injection   XR C-ARM NO REPORT    Follow-up: Return for visit to requesting provider as needed.   Procedures: No procedures performed  Lumbar Facet Joint Intra-Articular Injection(s) with Fluoroscopic Guidance  Patient: Erica [name]      Date of Birth: 07/14/1975 MRN: [id number] PCP: Catharine Ethelene BIRCH., PA-C      Visit Date: 05/18/2024   Universal Protocol:    Date/Time: 05/18/2024  Consent Given By: the patient  Position: PRONE   Additional Comments: Vital signs were monitored before and after the procedure. Patient was prepped and draped in the usual sterile fashion. The correct patient, procedure, and site was verified.   Injection Procedure Details:  Procedure Site  One Meds Administered:  Meds ordered this encounter  Medications   methylPREDNISolone  acetate (DEPO-MEDROL ) injection 40 mg     Laterality: Bilateral  Location/Site:  L4-L5  Needle size: 22 guage  Needle type: Spinal  Needle Placement: Articular  Findings:  -Comments: Excellent flow of contrast producing a partial arthrogram.  Procedure Details: The fluoroscope beam is vertically oriented in AP, and the inferior recess is visualized beneath the lower pole of the inferior apophyseal process, which represents the target point for needle insertion. When direct visualization is difficult the target point is located at the medial projection of the vertebral pedicle. The region overlying each aforementioned target is locally anesthetized with a 1 to 2 ml. volume of 1% Lidocaine  without Epinephrine .   The spinal needle was inserted into each of the above mentioned facet joints using biplanar fluoroscopic guidance. A 0.25 to 0.5 ml. volume of Isovue-250 was injected and a partial facet joint arthrogram was obtained. A single spot film was obtained of the resulting arthrogram.    One to 1.25 ml of the steroid/anesthetic solution was then injected into each of the facet joints noted above.   Additional Comments:  The patient tolerated the procedure well Dressing: 2 x 2 sterile gauze and Band-Aid    Post-procedure details: Patient was observed during the procedure. Post-procedure instructions were reviewed.  Patient left the clinic in stable condition.    Clinical History: MRI LUMBAR SPINE WITHOUT CONTRAST, 01/29/2024 10:39 AM  INDICATION: Low back pain, symptoms persist with > 6 wks treatment, Low back pain, unspecified \ M54.50  Low back pain, unspecified \ G89.29 Other chronic pain \ M43.16 Spondylolisthesis, lumbar region  COMPARISON: 01/12/2024 x-ray.  TECHNIQUE: Multiplanar, multisequence surface-coil magnetic resonance imaging of the lumbar spine was performed without contrast.     LEVELS IMAGED: Lower thoracic region to upper sacrum.   FINDINGS:    Alignment: Mild anterolisthesis of L4 on L5.  Vertebrae: Mild edema like signal at left L4-L5 pedicle, likely degenerative. Vertebral body heights are maintained. No marrow signal abnormalities to suggest neoplasm.  Conus medullaris: In normal position. Normal signal and contour.  Degenerative changes: Multilevel disc desiccation.  T12-L1: No substantial canal or foraminal stenosis. L1-L2: No substantial canal or foraminal stenosis. L2-L3: Mild diffuse disc bulge. No substantial canal or foraminal stenosis. L3-L4: Diffuse disc bulge, facet arthropathy and ligamentum flavum hypertrophy contribute to mild canal and moderate bilateral foraminal stenosis. L4-L5: Diffuse disc bulge, facet arthropathy and ligamentum flavum hypertrophy contribute to mild canal, moderate right and mild left foraminal L5-S1: Facet arthropathy and ligamentum flavum hypertrophy. No substantial canal or foraminal stenosis.  Upper sacrum: No focal lesion identified.  IMPRESSION: Multilevel spondylosis as described above, resulting in mild central canal stenosis at L3-L4 and L4-L5 and moderate bilateral foraminal narrowing at L3-L4. Additional moderate right and mild left foraminal stenosis at L4-L5. Exam End: 01/29/24 10:39  Specimen Collected: 01/30/24 12:43 Last Resulted: 01/30/24 15:13 Received From: [facility]     Objective:  VS:  HT:    WT:   BMI:     BP:(!) 135/91  HR:(!) 105bpm  TEMP: ( )  RESP:  Physical Exam Constitutional:      Appearance: She is obese.      Imaging: No results found.

## 2024-05-18 NOTE — Procedures (Signed)
 Lumbar Facet Joint Intra-Articular Injection(s) with Fluoroscopic Guidance  Patient: Erica [name]      Date of Birth: 10/28/1975 MRN: [id number] PCP: Catharine Ethelene BIRCH., PA-C      Visit Date: 05/18/2024   Universal Protocol:    Date/Time: 05/18/2024  Consent Given By: the patient  Position: PRONE   Additional Comments: Vital signs were monitored before and after the procedure. Patient was prepped and draped in the usual sterile fashion. The correct patient, procedure, and site was verified.   Injection Procedure Details:  Procedure Site One Meds Administered:  Meds ordered this encounter  Medications   methylPREDNISolone  acetate (DEPO-MEDROL ) injection 40 mg     Laterality: Bilateral  Location/Site:  L4-L5  Needle size: 22 guage  Needle type: Spinal  Needle Placement: Articular  Findings:  -Comments: Excellent flow of contrast producing a partial arthrogram.  Procedure Details: The fluoroscope beam is vertically oriented in AP, and the inferior recess is visualized beneath the lower pole of the inferior apophyseal process, which represents the target point for needle insertion. When direct visualization is difficult the target point is located at the medial projection of the vertebral pedicle. The region overlying each aforementioned target is locally anesthetized with a 1 to 2 ml. volume of 1% Lidocaine  without Epinephrine .   The spinal needle was inserted into each of the above mentioned facet joints using biplanar fluoroscopic guidance. A 0.25 to 0.5 ml. volume of Isovue-250 was injected and a partial facet joint arthrogram was obtained. A single spot film was obtained of the resulting arthrogram.    One to 1.25 ml of the steroid/anesthetic solution was then injected into each of the facet joints noted above.   Additional Comments:  The patient tolerated the procedure well Dressing: 2 x 2 sterile gauze and Band-Aid    Post-procedure details: Patient was  observed during the procedure. Post-procedure instructions were reviewed.  Patient left the clinic in stable condition.

## 2024-05-24 ENCOUNTER — Encounter: Payer: Self-pay | Admitting: Bariatrics

## 2024-05-24 ENCOUNTER — Ambulatory Visit: Admitting: Bariatrics

## 2024-05-24 VITALS — BP 137/81 | HR 90 | Ht 60.5 in | Wt 175.0 lb

## 2024-05-24 DIAGNOSIS — Z6833 Body mass index (BMI) 33.0-33.9, adult: Secondary | ICD-10-CM

## 2024-05-24 DIAGNOSIS — E669 Obesity, unspecified: Secondary | ICD-10-CM

## 2024-05-24 DIAGNOSIS — E6609 Other obesity due to excess calories: Secondary | ICD-10-CM

## 2024-05-24 DIAGNOSIS — R632 Polyphagia: Secondary | ICD-10-CM

## 2024-05-24 DIAGNOSIS — F5089 Other specified eating disorder: Secondary | ICD-10-CM

## 2024-05-24 MED ORDER — TIRZEPATIDE-WEIGHT MANAGEMENT 2.5 MG/0.5ML ~~LOC~~ SOLN: 2.5000 mg | SUBCUTANEOUS | 0 refills | Status: DC

## 2024-05-24 NOTE — Progress Notes (Signed)
 WEIGHT SUMMARY AND BIOMETRICS  Weight Lost Since Last Visit: 0  Weight Gained Since Last Visit: 3lb   Vitals BP: 137/81 Pulse Rate: 90 SpO2: 99 %   Anthropometric Measurements Height: 5' 0.5 (1.537 m) Weight: 175 lb (79.4 kg) BMI (Calculated): 33.6 Weight at Last Visit: 172lb Weight Lost Since Last Visit: 0 Weight Gained Since Last Visit: 3lb Starting Weight: 201lb Total Weight Loss (lbs): 26 lb (11.8 kg)   Body Composition  Body Fat %: 44.4 % Fat Mass (lbs): 78 lbs Muscle Mass (lbs): 92.8 lbs Total Body Water (lbs): 74.2 lbs Visceral Fat Rating : 11   Other Clinical Data Fasting: no Labs: no Today's Visit #: 11 Starting Date: 09/13/23    OBESITY Erica [name] is here to discuss her progress with her obesity treatment plan along with follow-up of her obesity related diagnoses.    Nutrition Plan: the Category 2 plan - 60% adherence.  Current exercise: walking  Interim History:  She is up 3 lbs since her last visit.  She states that the Qsymia has not been effective for either appetite or cravings and she wants to stop the medication.  She also states that she would like to pay out-of-pocket for Zepbound .  She was on Wegovy  in the past and did well without contraindications. Protein intake is as prescribed, Not journaling consistently., and Water intake is adequate.   Pharmacotherapy: Erica [name] is on Qsymia 7.5/46 mg 1 capsule by mouth daily in am Adverse side effects: None Hunger is poorly controlled.  Cravings are poorly controlled.  Assessment/Plan:  Polyphagia Erica [name] endorses excessive hunger.  Medication(s): Qsymia Pt states that the Qsymia did not help her at all.  Effects of medication:  poorly controlled. Cravings are poorly controlled.   Plan: Medication(s): Zepbound  2.5 mg SQ weekly through [employer] direct.  Will increase water,  protein and fiber to help assuage hunger.  Will minimize foods that have a high glucose index/load to minimize reactive hypoglycemia.   Erica [name] denies personal or family history of thyroid cancer, history of pancreatitis, or current cholelithiasis. Erica [name] was informed of the most common side effects (nausea, constipation, diarrhea). She was given GLP-1 information sheet.  Patient informed to watch for possible symptoms, such as a lump or swelling in the neck, hoarseness, trouble swallowing, or shortness of breath. If you have any of these symptoms, tell your healthcare provide.   She has been placed on a 500 calorie deficit diet.  She has been advised to exercise at least 150 minutes per week, both cardio and resistance.    Patient was counseled on the importance of maintaining healthy lifestyle habits, including balanced nutrition, regular physical activity, and behavioral modifications, while taking antiobesity medication.  Patient verbalized understanding that medication is an adjunct to, not a replacement for, lifestyle changes and that the long-term success and weight maintenance depend on continued adherence to these strategies.  Eating disorder/emotional eating Erica [name] has had issues with stress eating and emotional eating.  She was started on Qsymia  hoping that the Topamax  component would help with cravings but it has not. She was previously on Wegovy  which helped mainly with appetite but not with cravings but her insurance coverage for that medication was declined. She had taken Wegovy  for some time. Currently this is moderately controlled. Overall mood is stable. Denies suicidal/homicidal ideation. Medication(s): Was on Qsymia  but stopped the medication due to lack of effectiveness for either appetite or cravings.  Plan:  Motivational interviewing as well as evidence-based interventions for health behavior change were utilized today including the discussion of self monitoring  techniques, problem-solving barriers and SMART goal setting techniques.  Qsymia  discontinued.  Discussed distractions to curb eating behaviors. Discussed activities to do with one's hands in the evening  Be sure to get adequate rest as lack of rest can trigger appetite.  Have plan in place for stressful events.  Consider other rewards besides food.   Will begin Zepbound  2.5 mg which primarily helps with appetite but may help somewhat with cravings.  If cravings persist will consider Topamax  or other agent as a separate prescription at a subsequent visit. Discussed the difference between physical hunger and cravings and handouts were given.    Generalized Obesity: Current BMI BMI (Calculated): 33.6   Pharmacotherapy Plan Start  Zepbound  2.5 mg SQ weekly  Erica [name] is currently in the action stage of change. As such, her goal is to continue with weight loss efforts.  She has agreed to the Category 2 plan.  Exercise goals: For substantial health benefits, adults should do at least 150 minutes (2 hours and 30 minutes) a week of moderate-intensity, or 75 minutes (1 hour and 15 minutes) a week of vigorous-intensity aerobic physical activity, or an equivalent combination of moderate- and vigorous-intensity aerobic activity. Aerobic activity should be performed in episodes of at least 10 minutes, and preferably, it should be spread throughout the week.  Behavioral modification strategies: increasing lean protein intake, decreasing simple carbohydrates , meal planning , increasing fiber rich foods, weigh protein portions, measure portion sizes, and mindful eating.  Erica [name] has agreed to follow-up with our clinic in 4 weeks.       Objective:   VITALS: Per patient if applicable, see vitals. GENERAL: Alert and in no acute distress. CARDIOPULMONARY: No increased WOB. Speaking in clear sentences.  PSYCH: Pleasant and cooperative. Speech normal rate and rhythm. Affect is appropriate. Insight and  judgement are appropriate. Attention is focused, linear, and appropriate.  NEURO: Oriented as arrived to appointment on time with no prompting.   Attestation Statements:   This was prepared with the assistance of Engineer, Civil (consulting).  Occasional wrong-word or sound-a-like substitutions may have occurred due to the inherent limitations of voice recognition   Clayborne Daring, DO

## 2024-05-25 ENCOUNTER — Ambulatory Visit: Admitting: Orthopedic Surgery

## 2024-05-25 DIAGNOSIS — M545 Low back pain, unspecified: Secondary | ICD-10-CM | POA: Diagnosis not present

## 2024-05-25 NOTE — Progress Notes (Signed)
 Orthopedic Spine Surgery Office Note   Assessment: Patient is a 48 y.o. female with 2 issues:   1.  Chronic, progressive low back pain -suspect facet arthropathy as etiology 2.  Left hip pain, x-rays without significant degenerative change she has, positive FADIR, possible labral tear     Plan: -Patient has tried Tylenol , Celebrex, prednisone , facet injections -Since she has gotten good relief with the facet injections, told her that we can repeat that if pain returns. She will let me know and I will place another referral for her -Since we are co continuing to workup and treat the back, we will hold off on further hip workup at this point -Explained that hip replacement via an anterior approach can cause injury to the lateral femoral cutaneous nerve.  Given her history and distribution of numbness, that seems consistent with it.  It has not improved and I am not convinced that it will improve at this point.  Since she is not having pain or issues with this symptom, no intervention recommended at this time -Would need to be nicotine  free prior to any elective spine surgery    Patient expressed understanding of the plan and all questions were answered to the patient's satisfaction.    ___________________________________________________________________________     History:   Patient is a 48 y.o. female who presents today for follow-up on her lumbar spine.  Patient states that she is doing much better since she was last seen in the office. She felt the facet injections made a notable and significant improvement in her back pain.  She still continues to have left hip and groin pain.  She has no other pain into the lower extremities.  She notes the left hip pain is worse with walking or standing.  Improves if she sits at rest.  She also has reporting right anterolateral thigh numbness.  This started after her hip replacement surgery.  She said the hip replacement was done through a anterior  approach.  She was told that it would improve over time but it has not.  She says is not painful it is just numb and she is not sure what is going on.     Treatments tried: Tylenol , Celebrex, prednisone , facet injections     Physical Exam:   General: no acute distress, appears stated age Neurologic: alert, answering questions appropriately, following commands Respiratory: unlabored breathing on room air, symmetric chest rise Psychiatric: appropriate affect, normal cadence to speech     MSK (spine):   -Strength exam                                                   Left                  Right EHL                              5/5                  5/5 TA                                 5/5  5/5 GSC                             5/5                  5/5 Knee extension            5/5                  5/5 Hip flexion                    5/5                  5/5   -Sensory exam                           Sensation intact to light touch in L3-S1 nerve distributions of bilateral lower extremities (decrease sensation in the LFCN distribution on the right)  -Right hip exam: no pain through range of motion -Left hip exam: Positive FADIR, pain with internal rotation past 0 degrees, negative FABER    Imaging: XRs of the lumbar spine from 04/20/2024 were previously independently reviewed and interpreted, showing disc height loss at L4/5.  No other significant degenerative changes seen.  No evidence of instability on flexion/extension views.  No fracture or dislocation seen.   MRI of the lumbar spine from 01/29/2024 was previously independently reviewed and interpreted, showing right sided foraminal stenosis at L4/5.  No other significant stenosis seen.  Disc desiccation at L4/5.     Patient name: Erica [name] Patient MRN: [id number] Date of visit: 05/25/24

## 2024-06-14 ENCOUNTER — Other Ambulatory Visit: Payer: Self-pay | Admitting: Bariatrics

## 2024-06-16 ENCOUNTER — Encounter: Payer: Self-pay | Admitting: Orthopedic Surgery

## 2024-06-16 ENCOUNTER — Telehealth: Payer: Self-pay | Admitting: Orthopedic Surgery

## 2024-06-16 DIAGNOSIS — M25552 Pain in left hip: Secondary | ICD-10-CM

## 2024-06-16 DIAGNOSIS — M47816 Spondylosis without myelopathy or radiculopathy, lumbar region: Secondary | ICD-10-CM

## 2024-06-17 MED ORDER — CYCLOBENZAPRINE HCL 10 MG PO TABS: 10.0000 mg | ORAL_TABLET | Freq: Three times a day (TID) | ORAL | 0 refills | Status: AC | PRN

## 2024-06-19 ENCOUNTER — Other Ambulatory Visit: Payer: Self-pay | Admitting: Nurse Practitioner

## 2024-06-20 ENCOUNTER — Ambulatory Visit: Admitting: Bariatrics

## 2024-06-22 ENCOUNTER — Other Ambulatory Visit: Payer: Self-pay | Admitting: Physical Medicine and Rehabilitation

## 2024-06-22 ENCOUNTER — Other Ambulatory Visit: Payer: Self-pay

## 2024-06-22 ENCOUNTER — Encounter (HOSPITAL_COMMUNITY): Payer: Self-pay

## 2024-06-22 ENCOUNTER — Ambulatory Visit (HOSPITAL_COMMUNITY)

## 2024-06-22 ENCOUNTER — Telehealth: Payer: Self-pay

## 2024-06-22 DIAGNOSIS — R262 Difficulty in walking, not elsewhere classified: Secondary | ICD-10-CM | POA: Insufficient documentation

## 2024-06-22 DIAGNOSIS — Z7409 Other reduced mobility: Secondary | ICD-10-CM | POA: Insufficient documentation

## 2024-06-22 DIAGNOSIS — M25552 Pain in left hip: Secondary | ICD-10-CM | POA: Diagnosis not present

## 2024-06-22 DIAGNOSIS — M25652 Stiffness of left hip, not elsewhere classified: Secondary | ICD-10-CM | POA: Diagnosis present

## 2024-06-22 DIAGNOSIS — M545 Low back pain, unspecified: Secondary | ICD-10-CM | POA: Diagnosis present

## 2024-06-22 MED ORDER — DIAZEPAM 5 MG PO TABS: ORAL_TABLET | ORAL | 0 refills | Status: DC

## 2024-06-22 NOTE — Therapy (Addendum)
 " OUTPATIENT PHYSICAL THERAPY THORACOLUMBAR EVALUATION   Patient Name: Erica [name] MRN: [id number] DOB:July 26, 1975, 48 y.o., female Today's Date: 06/22/2024  END OF SESSION:  PT End of Session - 06/22/24 1142     Visit Number 1    Date for Recertification  08/03/24    Authorization Type [place] MEDICAID PREPAID HEALTH PLAN [place] MEDICAID [employer] OF [place]    Authorization Time Period 27 visits a year PT/OT and ST with no auth; needs auth after 27    Authorization - Visit Number 1    Authorization - Number of Visits 27 (P)     Progress Note Due on Visit 10 (P)     PT Start Time 0908 (P)    late arrival   PT Stop Time 0945 (P)     PT Time Calculation (min) 37 min (P)     Activity Tolerance Patient tolerated treatment well (P)     Behavior During Therapy WFL for tasks assessed/performed (P)           Past Medical History:  Diagnosis Date   Acid reflux    Asthma    Back pain    Eczema    Heart burn    High blood pressure    Osteoarthritis    stage 4 right hip   Pelvic inflammatory disease    SOBOE (shortness of breath on exertion)    Tubo-ovarian abscess    Past Surgical History:  Procedure Laterality Date   ABDOMINAL HYSTERECTOMY N/A 07/07/2013   Procedure: HYSTERECTOMY ABDOMINAL with bilateral salpingo-oophorectomy;  Surgeon: Agent KANDICE Solomons, MD;  Location: [facility];  Service: Gynecology;  Laterality: N/A;   CESAREAN SECTION     DILATION AND EVACUATION N/A 02/03/2013   Procedure: DILATATION AND EVACUATION;  Surgeon: Agent KANDICE Solomons, MD;  Location: [facility];  Service: Gynecology;  Laterality: N/A;   TOTAL HIP ARTHROPLASTY     Patient Active Problem List   Diagnosis Date Noted   Prediabetes 09/14/2023   Vitamin D  deficiency 09/14/2023   Elevated cholesterol 09/14/2023   Mild intermittent allergic asthma without complication 05/04/2023   GERD (gastroesophageal reflux disease) 05/04/2023   Preoperative respiratory examination 05/04/2023   Atypical asthma ? VCD  08/19/2021   Cigarette smoker 08/19/2021   Tobacco use disorder 01/23/2021   Chronic PID (chronic pelvic inflammatory disease) 07/07/2013   Right tubo-ovarian abscess 04/20/2013   Pelvic abscess in female 02/08/2013    PCP: Catharine Dunning D., PA-C   REFERRING PROVIDER: Georgina Ozell LABOR, MD  REFERRING DIAG: [phone] (ICD-10-CM) - Facet arthritis of lumbar region; M25.552 (ICD-10-CM) - Pain in left hip  Rationale for Evaluation and Treatment: Rehabilitation  THERAPY DIAG:  Low back pain, unspecified back pain laterality, unspecified chronicity, unspecified whether sciatica present  Impaired functional mobility, balance, gait, and endurance  Difficulty in walking, not elsewhere classified  Decreased range of left hip movement  ONSET DATE: few months  SUBJECTIVE:  SUBJECTIVE STATEMENT: Pt states she was coming for the right hip following the hip replacement and has shoe lift for LLE to compensate for increased length of RLE. Pt states she went to a different orthopaedic for left hip, that is now bothering her. Pt states she is also having back pain and had injections, which helped about 5 weeks ago. Doctor now talking about ablation. Pt states she is having pain with moving left hip. Pt states left hip is her primary concern. Pt is frustrated with healthcare system and lack of communication.  PERTINENT HISTORY:  Hx of R hip replacement  Lumbar injections 5 weeks ago  PAIN:  Are you having pain? Yes: NPRS scale: 6/10 when moving Pain location: deep inside hip Pain description: sharp Aggravating factors: lifting leg, walking  Relieving factors: sit down, muscle relaxer  PRECAUTIONS: None  RED FLAGS: None   WEIGHT BEARING RESTRICTIONS: No  FALLS:  Has patient fallen in last 6 months?  No  OCCUPATION: Nursing assistant at [employer]  PLOF: Independent and Independent with basic ADLs  PATIENT GOALS: decreased left hip and back pain, get better at squatting  NEXT MD VISIT: after therapy  OBJECTIVE:  Note: Objective measures were completed at Evaluation unless otherwise noted.  DIAGNOSTIC FINDINGS:  XRs of the lumbar spine from 04/20/2024 were independently reviewed and  interpreted, showing disc height loss at L4/5.  No other significant  degenerative changes seen.  No evidence of instability on  flexion/extension views.  No fracture or dislocation seen.  PATIENT SURVEYS:  LEFS Lower Extremity Functional Score: 45 / 80 = 56.3 %   SENSATION: Light touch: Impaired , LLE tingling when back flares up   POSTURE: flexed trunk   PALPATION: Pt demonstrates increased tenderness to palpation of bilateral lumbar paraspinals and bilateral sciatic regions, worse on LLE. Pt also demonstrates increased tenderness to anterior and lateral left hip.  LUMBAR ROM:   AROM eval  Flexion 70, pain  Extension 10, worse pain  Right lateral flexion 10  Left lateral flexion 10, slight pain  Right rotation 50% limited, back pain  Left rotation 50% limited, back pain   (Blank rows = not tested)  LOWER EXTREMITY ROM:     Active  Right eval Left eval  Hip flexion 90 88 degrees, pain  Hip extension    Hip abduction    Hip adduction    Hip internal rotation 30 30, increased pain  Hip external rotation 25, slight pain 30  Knee flexion    Knee extension    Ankle dorsiflexion    Ankle plantarflexion    Ankle inversion    Ankle eversion     (Blank rows = not tested)  LOWER EXTREMITY MMT:    MMT Right eval Left eval  Hip flexion 3+ 3  Hip extension 3+ 3+  Hip abduction 4- 3  Hip adduction    Hip internal rotation    Hip external rotation    Knee flexion    Knee extension    Ankle dorsiflexion    Ankle plantarflexion    Ankle inversion    Ankle eversion      (Blank rows = not tested)  LUMBAR SPECIAL TESTS:  Straight leg raise test: Positive, left (back pain)  FUNCTIONAL TESTS:  5 times sit to stand: TBA 2 minute walk test: TBA  GAIT: Distance walked: 85 feet to and from treatment area Assistive device utilized: None Level of assistance: Modified independence Comments: pt demonstrates antalgic gait pattern due to  pain in left hip and decreased gait speed.  TREATMENT DATE:  06/22/2024   Evaluation: -ROM measured, Strength assessed, HEP prescribed, pt educated on prognosis, findings, and importance of HEP compliance if given.                                                                                                                                      PATIENT EDUCATION:  Education details: Pt was educated on findings of PT evaluation, prognosis, frequency of therapy visits and rationale, attendance policy, and HEP if given.   Person educated: Patient Education method: Explanation, Verbal cues, and Handouts Education comprehension: verbalized understanding, verbal cues required, and needs further education  HOME EXERCISE PROGRAM: Access Code: [email or web address] URL: [email or web address] Date: 06/22/2024 Prepared by: Lang Ada  Exercises - Supine Bridge  - 1 x daily - 7 x weekly - 3 sets - 10 reps - Supine Lower Trunk Rotation  - 1 x daily - 7 x weekly - 3 sets - 10 reps - Sidelying Hip Abduction  - 1 x daily - 7 x weekly - 3 sets - 10 reps  ASSESSMENT:  CLINICAL IMPRESSION: Patient is a 48 y.o. female who was seen today for physical therapy evaluation and treatment for M25.552 (ICD-10-CM) - Pain in left hip.   Patient demonstrates increased low back and left hip pain, decreased LE strength bilaterally, abnormal pain with mobility of left hip, and impaired balance. Patient also demonstrates difficulty with ambulation during today's session with antalgic pattern noted, decreased stride length and velocity noted.  Patient also demonstrates decreased increased time required with functional bed mobility and transfers. Patient requires education on role of PT, prognosis, importance of HEP compliance and overall POC. Patient would benefit from skilled physical therapy for decreased low back and hip pain, increased endurance with ambulation, increased LE strength/ROM, and improved balance for improved gait quality, return to higher level of function with ADLs, and progress towards therapy goals.   OBJECTIVE IMPAIRMENTS: Abnormal gait, decreased activity tolerance, decreased balance, decreased endurance, decreased knowledge of condition, decreased knowledge of use of DME, decreased mobility, difficulty walking, decreased ROM, decreased strength, hypomobility, and pain.   ACTIVITY LIMITATIONS: carrying, lifting, bending, standing, squatting, sleeping, stairs, transfers, and bed mobility  PARTICIPATION LIMITATIONS: meal prep, cleaning, laundry, driving, shopping, community activity, occupation, and yard work  PERSONAL FACTORS: Age, Past/current experiences, Time since onset of injury/illness/exacerbation, and 1 comorbidity: hx of back pain and R THA are also affecting patient's functional outcome.   REHAB POTENTIAL: Fair multiple procedures completed RTHA, injections and ablation coming up.  CLINICAL DECISION MAKING: Evolving/moderate complexity  EVALUATION COMPLEXITY: Low   GOALS: Goals reviewed with patient? No  SHORT TERM GOALS: Target date: 07/13/24  Pt will be independent with HEP in order to demonstrate participation in Physical Therapy POC.  Baseline: Goal status: INITIAL  2.  Pt will report 4/10 pain with mobility in order to demonstrate improved pain  with ADLs.  Baseline: 6/10  Goal status: INITIAL  LONG TERM GOALS: Target date: 08/10/24  Pt will improve 5TSTS by 5 seconds in order to demonstrate improved functional strength to return to desired activities.  Baseline: see objective.  Goal  status: INITIAL  2.  Pt will improve 2 MWT by 40 feet in order to demonstrate improved functional ambulatory capacity in community setting.  Baseline: see objective.  Goal status: INITIAL  3.  Pt will improve LEFS score by at least 9 points in order to demonstrate improved pain with functional goals and outcomes. Baseline: see objective.  Goal status: INITIAL  4.  Pt will report 3/10 pain with mobility in order to demonstrate reduced pain with ADLs lasting greater than 30 minutes.  Baseline: see objective.  Goal status: INITIAL   PLAN:  PT FREQUENCY: 1-2x/week  PT DURATION: 6 weeks  PLANNED INTERVENTIONS: 97110-Therapeutic exercises, 97530- Therapeutic activity, 97112- Neuromuscular re-education, 97535- Self Care, [id number]- Manual therapy, [phone]- Gait training, Patient/Family education, Balance training, Stair training, Joint mobilization, Joint manipulation, Spinal manipulation, Spinal mobilization, DME instructions, Cryotherapy, and Moist heat.  PLAN FOR NEXT SESSION: 5TSTS, , SLS balance testing, progress pain management of low back and left hip, progress left hip and low back strengthening and mobility.   Lang Ada, PT, DPT [facility] Office: [phone] 12:00 PM, 06/22/2024   Managed Medicaid Authorization Request Treatment Start Date: 29-Jul-2024  Visit Dx Codes: M54.50; Z74.09; R26.2; M25.652  Functional Tool Score: LEFS Lower Extremity Functional Score: 45 / 80 = 56.3 %  For all possible CPT codes, reference the Planned Interventions line above.     Check all conditions that are expected to impact treatment: {Conditions expected to impact treatment:Structural or anatomic abnormalities and Unknown   If treatment provided at initial evaluation, no treatment charged due to lack of authorization.     "

## 2024-06-22 NOTE — Telephone Encounter (Signed)
 Pre Procedural Valium 

## 2024-06-26 ENCOUNTER — Ambulatory Visit: Admitting: Bariatrics

## 2024-06-26 ENCOUNTER — Encounter: Payer: Self-pay | Admitting: Bariatrics

## 2024-06-26 VITALS — BP 146/103 | HR 85 | Ht 60.5 in | Wt 174.0 lb

## 2024-06-26 DIAGNOSIS — E669 Obesity, unspecified: Secondary | ICD-10-CM | POA: Diagnosis not present

## 2024-06-26 DIAGNOSIS — R632 Polyphagia: Secondary | ICD-10-CM | POA: Diagnosis not present

## 2024-06-26 DIAGNOSIS — I10 Essential (primary) hypertension: Secondary | ICD-10-CM | POA: Diagnosis not present

## 2024-06-26 DIAGNOSIS — Z6833 Body mass index (BMI) 33.0-33.9, adult: Secondary | ICD-10-CM

## 2024-06-26 DIAGNOSIS — E66811 Obesity, class 1: Secondary | ICD-10-CM

## 2024-06-26 DIAGNOSIS — E559 Vitamin D deficiency, unspecified: Secondary | ICD-10-CM | POA: Diagnosis not present

## 2024-06-26 MED ORDER — VITAMIN D (ERGOCALCIFEROL) 1.25 MG (50000 UNIT) PO CAPS: 50000.0000 [IU] | ORAL_CAPSULE | ORAL | 0 refills | Status: AC

## 2024-06-26 MED ORDER — TIRZEPATIDE-WEIGHT MANAGEMENT 2.5 MG/0.5ML ~~LOC~~ SOLN: 2.5000 mg | SUBCUTANEOUS | 0 refills | Status: AC

## 2024-06-26 NOTE — Progress Notes (Signed)
 "                                                                                                             WEIGHT SUMMARY AND BIOMETRICS  Weight Lost Since Last Visit: 1lb  Weight Gained Since Last Visit: 0   Vitals BP: (!) 146/103 Pulse Rate: 85 SpO2: 99 %   Anthropometric Measurements Height: 5' 0.5 (1.537 m) Weight: 174 lb (78.9 kg) BMI (Calculated): 33.41 Weight at Last Visit: 175lb Weight Lost Since Last Visit: 1lb Weight Gained Since Last Visit: 0 Starting Weight: 201lb Total Weight Loss (lbs): 27 lb (12.2 kg)   Body Composition  Body Fat %: 44.6 % Fat Mass (lbs): 77.6 lbs Muscle Mass (lbs): 91.6 lbs Total Body Water (lbs): 69.8 lbs Visceral Fat Rating : 11   Other Clinical Data Fasting: no Labs: no Today's Visit #: 12 Starting Date: 09/13/23    OBESITY Erica [name] is here to discuss her progress with her obesity treatment plan along with follow-up of her obesity related diagnoses.    Nutrition Plan: the Category 2 plan - 60% adherence.  Current exercise: walking  Interim History:  She is down 1 lb since her last visit.  She states that she cannot get her Zepbound  today secondary to finances, but will be able to get the Zepbound  in the near future. Eating all of the food on the plan., Protein intake is less than prescribed., Is not skipping meals, Not journaling consistently., and Water intake is adequate.   Pharmacotherapy: Erica [name] is on Zepbound  2.5 mg SQ weekly Adverse side effects: None Hunger is poorly controlled.  Cravings are moderately controlled.  Assessment/Plan:   Vitamin D  Deficiency Vitamin D  is at goal of 50.  Most recent vitamin D  level was 50.3. She is on  prescription ergocalciferol  50,000 IU weekly. Lab Results  Component Value Date   VD25OH 50.3 02/21/2024   VD25OH 8.1 (L) 09/13/2023    Plan: Continue prescription vitamin D  50,000 IU weekly.   Polyphagia Erica [name] endorses excessive hunger.  She states that she  cannot afford her Zepbound  at this time will will be able to in the future.  She is asking for phentermine  which she states that she has taken in the past.  I had talked with her in the past about phentermine  as a possible option.  Medication(s): Zepbound  2.5 mg Effects of medication:  poorly controlled. Cravings are moderately controlled.   Plan: Medication(s): Zepbound  2.5 mg SQ weekly Will increase water, protein and fiber to help assuage hunger.  Will minimize foods that have a high glucose index/load to minimize reactive hypoglycemia.  We had a frank conversation discussing phentermine  and her elevated blood pressure readings.  I told her that I do not want to start her on phentermine  at this time due to her elevated blood pressure readings.  I do want her to continue the Zepbound  if she is able to get the medication and to continue with her diet and exercise at 85 to 95%.  She left today and stated that she  will not be back to the office.  She was unable to understand why she cannot take phentermine  at this time with her blood pressure readings.  I told her that we will be happy to see her in the future if she decides that she wants to come back to the office.  Hypertension Hypertension control uncertain.  Medication(s): Diltiazem  (Cardia XT) 120 mg 1 daily  She states that she did not take her blood pressure medication today but otherwise has been taking it on a regular basis.  She states that her blood pressures at home are normal.  I rechecked her blood pressure after she had been resting a little and her blood pressure was 134/80 with a large cuff, however her blood pressures have been elevated in the past on 05/18/2024, and 04/20/2024.   BP Readings from Last 3 Encounters:  06/26/24 (!) 146/103  05/24/24 137/81  05/18/24 (!) 135/91   Lab Results  Component Value Date   CREATININE 0.79 02/21/2024   CREATININE 0.64 09/13/2023   CREATININE 0.83 12/15/2022   No results found for:  GFR  Plan: Continue all antihypertensives at current dosages. No added salt. Will keep sodium content to 1,500 mg or less per day.  Will not prescribe phentermine  at this time secondary to elevated blood pressure readings.    Generalized Obesity: Current BMI BMI (Calculated): 33.41   Pharmacotherapy Plan Continue Zepbound .  Erica [name] may or may not be currently in the action stage of change. As such, her goal is to continue with weight loss efforts.  She will hopefully continue her meal plan counting her calories and protein levels.  Exercise goals: All adults should avoid inactivity. Some physical activity is better than none, and adults who participate in any amount of physical activity gain some health benefits.  Behavioral modification strategies: increasing lean protein intake, decreasing simple carbohydrates , no meal skipping, meal planning , increase water intake, better snacking choices, planning for success, increasing vegetables, increasing lower sugar fruits, increasing fiber rich foods, avoiding temptations, keep healthy foods in the home, increase frequency of journaling, weigh protein portions, measure portion sizes, work on smaller portions, and mindful eating.  Erica [name] has agreed to follow-up with our clinic if she decides that she wants to in the future.  No orders of the defined types were placed in this encounter.   Medications Discontinued During This Encounter  Medication Reason   pregabalin (LYRICA) 25 MG capsule Patient Preference   predniSONE  (DELTASONE ) 10 MG tablet Patient Preference   gabapentin (NEURONTIN) 300 MG capsule Patient Preference   doxycycline  (VIBRAMYCIN ) 100 MG capsule Patient Preference     No orders of the defined types were placed in this encounter.     Objective:   VITALS: Per patient if applicable, see vitals. GENERAL: Alert and in no acute distress. CARDIOPULMONARY: No increased WOB. Speaking in clear sentences.  PSYCH:  Pleasant and cooperative. Speech normal rate and rhythm. Affect is appropriate. Insight and judgement are appropriate. Attention is focused, linear, and appropriate.  NEURO: Oriented as arrived to appointment on time with no prompting.   Attestation Statements:   This was prepared with the assistance of Engineer, Civil (consulting).  Occasional wrong-word or sound-a-like substitutions may have occurred due to the inherent limitations of voice recognition   Clayborne Daring, DO   "

## 2024-07-04 ENCOUNTER — Other Ambulatory Visit: Payer: Self-pay

## 2024-07-04 DIAGNOSIS — K219 Gastro-esophageal reflux disease without esophagitis: Secondary | ICD-10-CM

## 2024-07-04 MED ORDER — OMEPRAZOLE 40 MG PO CPDR: 40.0000 mg | DELAYED_RELEASE_CAPSULE | Freq: Every day | ORAL | 3 refills | Status: AC

## 2024-07-04 NOTE — Telephone Encounter (Signed)
 Received refill request for Omeprazole  from [facility].  Per LOV notes: Restart Omeprazole  daily  Omeprazole  refilled, NFN.

## 2024-07-05 ENCOUNTER — Encounter (HOSPITAL_COMMUNITY): Payer: Self-pay

## 2024-07-05 ENCOUNTER — Ambulatory Visit (HOSPITAL_COMMUNITY)

## 2024-07-05 DIAGNOSIS — M545 Low back pain, unspecified: Secondary | ICD-10-CM | POA: Diagnosis not present

## 2024-07-05 DIAGNOSIS — Z7409 Other reduced mobility: Secondary | ICD-10-CM

## 2024-07-05 DIAGNOSIS — R262 Difficulty in walking, not elsewhere classified: Secondary | ICD-10-CM

## 2024-07-05 NOTE — Therapy (Addendum)
 " OUTPATIENT PHYSICAL THERAPY THORACOLUMBAR TREATMENT   Patient Name: Erica [name] MRN: [id number] DOB:1976/04/17, 48 y.o., female Today's Date: 07/05/2024  END OF SESSION:  PT End of Session - 07/05/24 0735     Visit Number 2    Number of Visits 10    Date for Recertification  08/03/24    Authorization Type [place] MEDICAID PREPAID HEALTH PLAN [place] MEDICAID [employer] [place]    Authorization Time Period 27 visits a year PT/OT and ST with no auth; needs auth after 27    Authorization - Visit Number 2    Authorization - Number of Visits 27    Progress Note Due on Visit 27    PT Start Time [phone]   late arrival   PT Stop Time 0813    PT Time Calculation (min) 36 min    Activity Tolerance Patient tolerated treatment well    Behavior During Therapy WFL for tasks assessed/performed          Past Medical History:  Diagnosis Date   Acid reflux    Asthma    Back pain    Eczema    Heart burn    High blood pressure    Osteoarthritis    stage 4 right hip   Pelvic inflammatory disease    SOBOE (shortness of breath on exertion)    Tubo-ovarian abscess    Past Surgical History:  Procedure Laterality Date   ABDOMINAL HYSTERECTOMY N/A 07/07/2013   Procedure: HYSTERECTOMY ABDOMINAL with bilateral salpingo-oophorectomy;  Surgeon: Agent KANDICE Solomons, MD;  Location: [facility];  Service: Gynecology;  Laterality: N/A;   CESAREAN SECTION     DILATION AND EVACUATION N/A 02/03/2013   Procedure: DILATATION AND EVACUATION;  Surgeon: Agent KANDICE Solomons, MD;  Location: [facility];  Service: Gynecology;  Laterality: N/A;   TOTAL HIP ARTHROPLASTY     Patient Active Problem List   Diagnosis Date Noted   Prediabetes 09/14/2023   Vitamin D  deficiency 09/14/2023   Elevated cholesterol 09/14/2023   Mild intermittent allergic asthma without complication 05/04/2023   GERD (gastroesophageal reflux disease) 05/04/2023   Preoperative respiratory examination 05/04/2023   Atypical asthma ? VCD 08/19/2021    Cigarette smoker 08/19/2021   Tobacco use disorder 01/23/2021   Chronic PID (chronic pelvic inflammatory disease) 07/07/2013   Right tubo-ovarian abscess 04/20/2013   Pelvic abscess in female 02/08/2013    PCP: Catharine Dunning D., PA-C   REFERRING PROVIDER: Georgina Ozell LABOR, MD  REFERRING DIAG: [phone] (ICD-10-CM) - Facet arthritis of lumbar region; M25.552 (ICD-10-CM) - Pain in left hip  Rationale for Evaluation and Treatment: Rehabilitation  THERAPY DIAG:  Low back pain, unspecified back pain laterality, unspecified chronicity, unspecified whether sciatica present  Impaired functional mobility, balance, gait, and endurance  Difficulty in walking, not elsewhere classified  ONSET DATE: few months  SUBJECTIVE:  SUBJECTIVE STATEMENT: 07/05/24:  Reports pain with Lt hip movement can increase to 10/10, 3/10 at rest.  Arrived with additional heel lift bottom of Lt foot, reports she occasionally LOB.  Eval:  Pt states she was coming for the right hip following the hip replacement and has shoe lift for LLE to compensate for increased length of RLE. Pt states she went to a different orthopaedic for left hip, that is now bothering her. Pt states she is also having back pain and had injections, which helped about 5 weeks ago. Doctor now talking about ablation. Pt states she is having pain with moving left hip. Pt states left hip is her primary concern. Pt is frustrated with healthcare system and lack of communication.  PERTINENT HISTORY:  Hx of R hip replacement  Lumbar injections 5 weeks ago  PAIN:  Are you having pain? Yes: NPRS scale: 6/10 when moving Pain location: deep inside hip Pain description: sharp Aggravating factors: lifting leg, walking  Relieving factors: sit down, muscle  relaxer  PRECAUTIONS: None  RED FLAGS: None   WEIGHT BEARING RESTRICTIONS: No  FALLS:  Has patient fallen in last 6 months? No  OCCUPATION: Nursing assistant at [employer]  PLOF: Independent and Independent with basic ADLs  PATIENT GOALS: decreased left hip and back pain, get better at squatting  NEXT MD VISIT: after therapy  OBJECTIVE:  Note: Objective measures were completed at Evaluation unless otherwise noted.  DIAGNOSTIC FINDINGS:  XRs of the lumbar spine from 04/20/2024 were independently reviewed and  interpreted, showing disc height loss at L4/5.  No other significant  degenerative changes seen.  No evidence of instability on  flexion/extension views.  No fracture or dislocation seen.  PATIENT SURVEYS:  LEFS Lower Extremity Functional Score: 45 / 80 = 56.3 %   SENSATION: Light touch: Impaired , LLE tingling when back flares up   POSTURE: flexed trunk   PALPATION: Pt demonstrates increased tenderness to palpation of bilateral lumbar paraspinals and bilateral sciatic regions, worse on LLE. Pt also demonstrates increased tenderness to anterior and lateral left hip.  LUMBAR ROM:   AROM eval  Flexion 70, pain  Extension 10, worse pain  Right lateral flexion 10  Left lateral flexion 10, slight pain  Right rotation 50% limited, back pain  Left rotation 50% limited, back pain   (Blank rows = not tested)  LOWER EXTREMITY ROM:     Active  Right eval Left eval  Hip flexion 90 88 degrees, pain  Hip extension    Hip abduction    Hip adduction    Hip internal rotation 30 30, increased pain  Hip external rotation 25, slight pain 30  Knee flexion    Knee extension    Ankle dorsiflexion    Ankle plantarflexion    Ankle inversion    Ankle eversion     (Blank rows = not tested)  LOWER EXTREMITY MMT:    MMT Right eval Left eval  Hip flexion 3+ 3  Hip extension 3+ 3+  Hip abduction 4- 3  Hip adduction    Hip internal rotation    Hip external  rotation    Knee flexion    Knee extension    Ankle dorsiflexion    Ankle plantarflexion    Ankle inversion    Ankle eversion     (Blank rows = not tested)  LUMBAR SPECIAL TESTS:  Straight leg raise test: Positive, left (back pain) 07/05/24:  Debby test: Lt LE positive  FUNCTIONAL TESTS:  5 times sit to stand: 07/05/24: 12.85 increased back pain 2 minute walk test: 07/05/24: 367ft no AD, no increased pain 07/05/24:  SLS Lt 30 reports of burning Rt 22  GAIT: Distance walked: 85 feet to and from treatment area Assistive device utilized: None Level of assistance: Modified independence Comments: pt demonstrates antalgic gait pattern due to pain in left hip and decreased gait speed.  TREATMENT DATE:  07/05/24: Reviewed goals Educated importance of HEP complaince for maximal benefits  5 times sit to stand: 12.85 increased back pain 2 minute walk test: 323ft no AD, no increased pain SLS Lt 30 reports of burning Rt 22  Supine: - Bridge 15x - LTR 5x 10 - Posterior pelvic tilt with verbal and tactile cueing 10x 5 Sidelying: - Abduction Standing: - Marching 10x  06/22/2024   Evaluation: -ROM measured, Strength assessed, HEP prescribed, pt educated on prognosis, findings, and importance of HEP compliance if given.                                                                                                                                      PATIENT EDUCATION:  Education details: Pt was educated on findings of PT evaluation, prognosis, frequency of therapy visits and rationale, attendance policy, and HEP if given.   Person educated: Patient Education method: Explanation, Verbal cues, and Handouts Education comprehension: verbalized understanding, verbal cues required, and needs further education  HOME EXERCISE PROGRAM: Access Code: [email or web address] URL: [email or web address] Date: 06/22/2024 Prepared by: Lang Ada  Exercises - Supine Bridge   - 1 x daily - 7 x weekly - 3 sets - 10 reps - Supine Lower Trunk Rotation  - 1 x daily - 7 x weekly - 3 sets - 10 reps - Sidelying Hip Abduction  - 1 x daily - 7 x weekly - 3 sets - 10 reps  ASSESSMENT:  CLINICAL IMPRESSION: Reviewed goals and educated importance of HEP compliance, pt able to demonstrate appropriate form and control with current exercise program.  Pt reports pain with flexion based movements.  Positive Thomas test for Lt hip.  Functional testing complete including: - 344ft no AD with no increased pain, 5STS with increased LBP and SLS.  Added core strengthening exercises to assist with stability, verbal and tactile cueing to improve pelvic movements.  EOS pain scale 5/10.  Patient is a 48 y.o. female who was seen today for physical therapy evaluation and treatment for M25.552 (ICD-10-CM) - Pain in left hip.   Patient demonstrates increased low back and left hip pain, decreased LE strength bilaterally, abnormal pain with mobility of left hip, and impaired balance. Patient also demonstrates difficulty with ambulation during today's session with antalgic pattern noted, decreased stride length and velocity noted. Patient also demonstrates decreased increased time required with functional bed mobility and transfers. Patient requires education on role of PT, prognosis, importance of HEP compliance and overall POC. Patient would benefit from skilled  physical therapy for decreased low back and hip pain, increased endurance with ambulation, increased LE strength/ROM, and improved balance for improved gait quality, return to higher level of function with ADLs, and progress towards therapy goals.   OBJECTIVE IMPAIRMENTS: Abnormal gait, decreased activity tolerance, decreased balance, decreased endurance, decreased knowledge of condition, decreased knowledge of use of DME, decreased mobility, difficulty walking, decreased ROM, decreased strength, hypomobility, and pain.   ACTIVITY  LIMITATIONS: carrying, lifting, bending, standing, squatting, sleeping, stairs, transfers, and bed mobility  PARTICIPATION LIMITATIONS: meal prep, cleaning, laundry, driving, shopping, community activity, occupation, and yard work  PERSONAL FACTORS: Age, Past/current experiences, Time since onset of injury/illness/exacerbation, and 1 comorbidity: hx of back pain and R THA are also affecting patient's functional outcome.   REHAB POTENTIAL: Fair multiple procedures completed RTHA, injections and ablation coming up.  CLINICAL DECISION MAKING: Evolving/moderate complexity  EVALUATION COMPLEXITY: Low   GOALS: Goals reviewed with patient? No  SHORT TERM GOALS: Target date: 07/13/24  Pt will be independent with HEP in order to demonstrate participation in Physical Therapy POC.  Baseline: Goal status: INITIAL  2.  Pt will report 4/10 pain with mobility in order to demonstrate improved pain with ADLs.  Baseline: 6/10  Goal status: INITIAL  LONG TERM GOALS: Target date: 08/10/24  Pt will improve 5TSTS by 5 seconds in order to demonstrate improved functional strength to return to desired activities.  Baseline: see objective.  Goal status: INITIAL  2.  Pt will improve 2 MWT by 40 feet in order to demonstrate improved functional ambulatory capacity in community setting.  Baseline: see objective.  Goal status: INITIAL  3.  Pt will improve LEFS score by at least 9 points in order to demonstrate improved pain with functional goals and outcomes. Baseline: see objective.  Goal status: INITIAL  4.  Pt will report 3/10 pain with mobility in order to demonstrate reduced pain with ADLs lasting greater than 30 minutes.  Baseline: see objective.  Goal status: INITIAL   PLAN:  PT FREQUENCY: 1-2x/week  PT DURATION: 6 weeks  PLANNED INTERVENTIONS: 97110-Therapeutic exercises, 97530- Therapeutic activity, 97112- Neuromuscular re-education, 97535- Self Care, [id number]- Manual therapy, [phone]- Gait  training, Patient/Family education, Balance training, Stair training, Joint mobilization, Joint manipulation, Spinal manipulation, Spinal mobilization, DME instructions, Cryotherapy, and Moist heat.  PLAN FOR NEXT SESSION: progress pain management of low back and left hip, progress left hip and low back strengthening and mobility.   Augustin Mclean, LPTA/CLT; CBIS [phone]   8:33 AM, 07/05/2024   "

## 2024-07-10 ENCOUNTER — Other Ambulatory Visit: Payer: Self-pay

## 2024-07-10 ENCOUNTER — Emergency Department (HOSPITAL_COMMUNITY)
Admission: EM | Admit: 2024-07-10 | Discharge: 2024-07-10 | Disposition: A | Discharge status: Home / Self Care | Attending: Emergency Medicine | Admitting: Emergency Medicine

## 2024-07-10 ENCOUNTER — Encounter (HOSPITAL_COMMUNITY): Payer: Self-pay | Admitting: *Deleted

## 2024-07-10 ENCOUNTER — Emergency Department (HOSPITAL_COMMUNITY)

## 2024-07-10 DIAGNOSIS — Y990 Civilian activity done for income or pay: Secondary | ICD-10-CM | POA: Diagnosis not present

## 2024-07-10 DIAGNOSIS — M25511 Pain in right shoulder: Secondary | ICD-10-CM | POA: Insufficient documentation

## 2024-07-10 DIAGNOSIS — X500XXA Overexertion from strenuous movement or load, initial encounter: Secondary | ICD-10-CM | POA: Insufficient documentation

## 2024-07-10 MED ORDER — KETOROLAC TROMETHAMINE 15 MG/ML IJ SOLN
15.0000 mg | Freq: Once | INTRAMUSCULAR | Status: AC
  Administered 2024-07-10: 15 mg via INTRAMUSCULAR
  Filled 2024-07-10: qty 1

## 2024-07-10 NOTE — ED Provider Notes (Signed)
 " [place] EMERGENCY DEPARTMENT AT [facility] Provider Note   CSN: [id number] Arrival date & time: 07/10/24  0800     Patient presents with: Shoulder Pain   Erica [name] is a 49 y.o. female.   Patient with no prior past medical history presents today with complaints of right shoulder pain.  Reports that her symptoms began on Sunday.  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 Denies history of similar pain previously. Has not tried anything for her symptoms. Pain is worse with shoulder movements. Denies chest pain or shortness of breath. No neck pain, sharp shooting pain down their extremities or numbness/tingling.   The history is provided by the patient. No language interpreter was used.  Shoulder Pain      Prior to Admission medications  Medication Sig Start Date End Date Taking? Authorizing Provider  albuterol  (PROVENTIL ) (2.5 MG/3ML) 0.083% nebulizer solution Take 3 mLs (2.5 mg total) by nebulization every 6 (six) hours as needed for wheezing or shortness of breath. 02/25/24   Parrett, Tammy S, NP  Azelastine HCl 137 MCG/SPRAY SOLN Spray 2 sprays twice a day by intranasal route, for nasal congestion. 02/03/24   [provider]  benzonatate  (TESSALON ) 200 MG capsule Take 1 capsule (200 mg total) by mouth 3 (three) times daily as needed. 02/25/24 02/24/25  Parrett, Madelin RAMAN, NP  cyclobenzaprine  (FLEXERIL ) 10 MG tablet Take 1 tablet (10 mg total) by mouth 3 (three) times daily as needed (pain, muscle spasms). 06/17/24   Georgina Ozell LABOR, MD  diazepam  (VALIUM ) 5 MG tablet Take one tablet by mouth with light food one hour prior to procedure. 06/22/24   Williams, Megan E, NP  diltiazem  (TIAZAC ) 120 MG 24 hr capsule Take 120 mg by mouth daily. 04/27/23   [provider]  famotidine  (PEPCID ) 20 MG tablet One after supper 08/19/21   Darlean Ozell NOVAK, MD  Fluticasone -Umeclidin-Vilant (TRELEGY  ELLIPTA) 100-62.5-25 MCG/ACT AEPB Inhale 1 puff into the lungs daily. 02/25/24   Parrett, Madelin RAMAN, NP  Fluticasone -Umeclidin-Vilant (TRELEGY ELLIPTA ) 100-62.5-25 MCG/ACT AEPB Inhale 1 puff into the lungs daily. 02/25/24   Parrett, Madelin RAMAN, NP  levalbuterol  (XOPENEX  HFA) 45 MCG/ACT inhaler Inhale 2 puffs into the lungs every 4 (four) hours as needed for wheezing. 05/04/23   Cobb, Comer GAILS, NP  montelukast  (SINGULAIR ) 10 MG tablet TAKE ONE TABLET BY MOUTH AT BEDTIME 06/19/24   Parrett, Tammy S, NP  omeprazole  (PRILOSEC) 40 MG capsule Take 1 capsule (40 mg total) by mouth daily. 07/04/24   Parrett, Madelin RAMAN, NP  tirzepatide  (ZEPBOUND ) 2.5 MG/0.5ML injection vial Inject 2.5 mg into the skin once a week. 06/26/24   Delores Shields A, DO  Vitamin D , Ergocalciferol , (DRISDOL ) 1.25 MG (50000 UNIT) CAPS capsule Take 1 capsule (50,000 Units total) by mouth every 7 (seven) days. 06/26/24   Delores Shields LABOR, DO    Allergies: Strawberry (diagnostic)    Review of Systems  Musculoskeletal:  Positive for arthralgias.  All other systems reviewed and are negative.   Updated Vital Signs BP (!) 125/94 (BP Location: Left Arm)   Pulse 87   Temp 97.9 F (36.6 C) (Oral)   Resp 16   Ht 5' 0.5 (1.537 m)   Wt 78.9 kg   LMP 06/17/2013   SpO2 97%   BMI 33.42 kg/m   Physical Exam Vitals and nursing note reviewed.  Constitutional:      General:  She is not in acute distress.    Appearance: Normal appearance. She is normal weight. She is not ill-appearing, toxic-appearing or diaphoretic.  HENT:     Head: Normocephalic and atraumatic.  Neck:     Comments: No midline cervical spine tenderness. Cardiovascular:     Rate and Rhythm: Normal rate.  Pulmonary:     Effort: Pulmonary effort is normal. No respiratory distress.  Musculoskeletal:        General: Normal range of motion.     Cervical back: Normal range of motion and neck supple.     Comments: TTP noted throughout palpation of the right humeral head area.  No obvious deformity. Strong radial pulse. Shoulder ROM limited due to pain, passive flexion/extension intact. 5/5 strength to the right elbow, wrist, and hand. No overlying skin changes  Skin:    General: Skin is warm and dry.  Neurological:     General: No focal deficit present.     Mental Status: She is alert.  Psychiatric:        Mood and Affect: Mood normal.        Behavior: Behavior normal.     (all labs ordered are listed, but only abnormal results are displayed) Labs Reviewed - No data to display  EKG: None  Radiology: No results found.   Procedures   Medications Ordered in the ED  ketorolac  (TORADOL ) 15 MG/ML injection 15 mg (15 mg Intramuscular Given 07/10/24 0908)                                    Medical Decision Making Amount and/or Complexity of Data Reviewed Radiology: ordered.  Risk Prescription drug management.   Patient presents today with complaints of atraumatic right shoulder pain x 1 day.  She is afebrile, nontoxic-appearing, and in no acute distress reassuring vital signs.  Physical exam reveals TTP noted throughout palpation of the right humeral head area. No obvious deformity. Strong radial pulse. Shoulder ROM limited due to pain, passive flexion/extension intact. 5/5 strength to the right elbow, wrist, and hand. No overlying skin changes.  X-ray imaging ordered and obtained which has resulted and reveals  No acute findings.   Calcific tendinopathy of the rotator cuff tendon.  I personally reviewed and interpreted this imaging and agree with radiology interpretation.  Patient treated with Toradol  with improvement.  Discussed x-ray results with patient is understanding.  Suspect his findings are etiology of patient's symptoms.  No signs or symptoms to suggest ACS/PE. No signs or symptoms of DVT.  No further evaluation is indicated with labs or additional imaging.  Will provide orthopedic referral for follow-up, recommend RICE and NSAIDs.  Evaluation and diagnostic testing in the emergency department does not suggest an emergent condition requiring admission or immediate intervention beyond what has been performed at this time.  Plan for discharge with close PCP follow-up.  Patient is understanding and amenable with plan, educated on red flag symptoms that would prompt immediate return.  Patient discharged in stable condition.  Final diagnoses:  Acute pain of right shoulder    ED Discharge Orders     None     An After Visit Summary was printed and given to the patient.      Ajeet Casasola A, PA-C 07/10/24 1046    Suzette Pac, MD 07/11/24 1712  "

## 2024-07-10 NOTE — ED Notes (Signed)
"  Xray at bedside.   "

## 2024-07-10 NOTE — ED Triage Notes (Signed)
 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

## 2024-07-10 NOTE — Discharge Instructions (Signed)
 As we discussed, your workup in the ER today was reassuring for acute findings.  X-ray imaging of your shoulder did show some chronic degenerative changes specifically of your rotator cuff which is likely the cause of your symptoms.  I recommend that you rest your shoulder, reduce the amount of heavy lifting you are doing, take Tylenol /ibuprofen  as needed for pain, you can also apply ice/heat as well.  Please use acetaminophen  (Tylenol ) or ibuprofen  (Advil , Motrin ) for pain.  You may use 800 mg ibuprofen  every 6 hours or 1000 mg of acetaminophen  every 6 hours.  You may choose to alternate between the two, this would be most effective. Do not exceed 4000 mg of acetaminophen  within 24 hours.  Do not exceed 3200 mg ibuprofen  within 24 hours.  Additionally, I have given you a referral to orthopedics with a number to call to schedule appointment for follow-up.  Please do so at your earliest convenience.  Return if development of any new or worsening symptoms.

## 2024-07-11 ENCOUNTER — Telehealth: Payer: Self-pay | Admitting: Orthopedic Surgery

## 2024-07-11 NOTE — Telephone Encounter (Signed)
 You were not on call this date but looks like she saw you before for something else. Please advise

## 2024-07-11 NOTE — Telephone Encounter (Signed)
 Pt states she was seen in the ED at [facility] on 07/11/23 and wanted to see if Dr.Moore could take a look at her xrays of her shoulder and tell what her next step need to be or if she should see Orthopedic.  Pt states she was given a out of work note for 2 day but her shoulder is still in severe pain.

## 2024-07-12 ENCOUNTER — Ambulatory Visit (HOSPITAL_COMMUNITY)

## 2024-07-14 ENCOUNTER — Ambulatory Visit (HOSPITAL_COMMUNITY)

## 2024-07-17 ENCOUNTER — Other Ambulatory Visit: Payer: Self-pay

## 2024-07-17 ENCOUNTER — Ambulatory Visit: Admitting: Physical Medicine and Rehabilitation

## 2024-07-17 VITALS — BP 131/88 | HR 130

## 2024-07-17 DIAGNOSIS — M47816 Spondylosis without myelopathy or radiculopathy, lumbar region: Secondary | ICD-10-CM

## 2024-07-17 MED ORDER — BUPIVACAINE HCL 0.5 % IJ SOLN
3.0000 mL | Freq: Once | INTRAMUSCULAR | Status: AC
  Administered 2024-07-17: 3 mL

## 2024-07-17 NOTE — Progress Notes (Signed)
 Pain Scale   Average Pain 6 Patient advising she has lower back pain that radiates bilaterally to legs at times. #1 pain diary given today        +Driver, -BT, -Dye Allergies.

## 2024-07-17 NOTE — Procedures (Signed)
 Lumbar Diagnostic Facet Joint Nerve Block with Fluoroscopic Guidance   Patient: Erica [name]      Date of Birth: [birth year] MRN: [id number] PCP: Catharine Ethelene BIRCH., PA-C      Visit Date: 07/17/2024   Universal Protocol:    Date/Time: 1/12/202610:01 AM  Consent Given By: the patient  Position: PRONE  Additional Comments: Vital signs were monitored before and after the procedure. Patient was prepped and draped in the usual sterile fashion. The correct patient, procedure, and site was verified.   Injection Procedure Details:   Procedure diagnoses:  1. Spondylosis without myelopathy or radiculopathy, lumbar region      Meds Administered:  Meds ordered this encounter  Medications   bupivacaine  (MARCAINE ) 0.5 % (with pres) injection 3 mL     Laterality: Bilateral  Location/Site: L4-L5, L3 and L4 medial branches  Needle: 5.0 in., 25 ga.  Short bevel or Quincke spinal needle  Needle Placement: Oblique pedical  Findings:   -Comments: There was excellent flow of contrast along the articular pillars without intravascular flow.  Procedure Details: The fluoroscope beam is vertically oriented in AP and then obliqued 15 to 20 degrees to the ipsilateral side of the desired nerve to achieve the Scotty dog appearance.  The skin over the target area of the junction of the superior articulating process and the transverse process (sacral ala if blocking the L5 dorsal rami) was locally anesthetized with a 1 ml volume of 1% Lidocaine  without Epinephrine .  The spinal needle was inserted and advanced in a trajectory view down to the target.   After contact with periosteum and negative aspirate for blood and CSF, correct placement without intravascular or epidural spread was confirmed by injecting 0.5 ml. of Isovue-250.  A spot radiograph was obtained of this image.    Next, a 0.5 ml. volume of the injectate described above was injected. The needle was then redirected to the other facet  joint nerves mentioned above if needed.  Prior to the procedure, the patient was given a Pain Diary which was completed for baseline measurements.  After the procedure, the patient rated their pain every 30 minutes and will continue rating at this frequency for a total of 5 hours.  The patient has been asked to complete the Diary and return to us  by mail, fax or hand delivered as soon as possible.   Additional Comments:  The patient tolerated the procedure well Dressing: 2 x 2 sterile gauze and Band-Aid    Post-procedure details: Patient was observed during the procedure. Post-procedure instructions were reviewed.  Patient left the clinic in stable condition.

## 2024-07-17 NOTE — Progress Notes (Signed)
 "  Erica [name] - 49 y.o. female MRN [id number]  Date of birth: 01/14/1976  Office Visit Note: Visit Date: 07/17/2024 PCP: Catharine Ethelene BIRCH., PA-C Referred by: Catharine Ethelene BIRCH., PA-C  Subjective: Chief Complaint  Patient presents with   Lower Back - Pain   HPI:  Erica [name] is a 49 y.o. female who comes in today for planned repeat Bilateral L4-5 Lumbar facet/medial branch block with fluoroscopic guidance.  The patient has failed conservative care including home exercise, medications, time and activity modification.  This injection will be diagnostic and hopefully therapeutic.  Please see requesting physician notes for further details and justification.  Exam shows concordant low back pain with facet joint loading and extension. Patient received more than 80% pain relief from prior injection. This would be the second block in a diagnostic double block paradigm.     Referring:Dr. Ozell Ada   ROS Otherwise per HPI.  Assessment & Plan: Visit Diagnoses:    ICD-10-CM   1. Spondylosis without myelopathy or radiculopathy, lumbar region  M47.816 XR C-ARM NO REPORT    Nerve Block    bupivacaine  (MARCAINE ) 0.5 % (with pres) injection 3 mL      Plan: No additional findings.   Meds & Orders:  Meds ordered this encounter  Medications   bupivacaine  (MARCAINE ) 0.5 % (with pres) injection 3 mL    Orders Placed This Encounter  Procedures   Nerve Block   XR C-ARM NO REPORT    Follow-up: Return for Review Pain Diary.   Procedures: No procedures performed  Lumbar Diagnostic Facet Joint Nerve Block with Fluoroscopic Guidance   Patient: Erica [name]      Date of Birth: 08-Sep-1975 MRN: [id number] PCP: Catharine Ethelene BIRCH., PA-C      Visit Date: 07/17/2024   Universal Protocol:    Date/Time: 1/12/202610:01 AM  Consent Given By: the patient  Position: PRONE  Additional Comments: Vital signs were monitored before and after the procedure. Patient was prepped and draped in  the usual sterile fashion. The correct patient, procedure, and site was verified.   Injection Procedure Details:   Procedure diagnoses:  1. Spondylosis without myelopathy or radiculopathy, lumbar region      Meds Administered:  Meds ordered this encounter  Medications   bupivacaine  (MARCAINE ) 0.5 % (with pres) injection 3 mL     Laterality: Bilateral  Location/Site: L4-L5, L3 and L4 medial branches  Needle: 5.0 in., 25 ga.  Short bevel or Quincke spinal needle  Needle Placement: Oblique pedical  Findings:   -Comments: There was excellent flow of contrast along the articular pillars without intravascular flow.  Procedure Details: The fluoroscope beam is vertically oriented in AP and then obliqued 15 to 20 degrees to the ipsilateral side of the desired nerve to achieve the Scotty dog appearance.  The skin over the target area of the junction of the superior articulating process and the transverse process (sacral ala if blocking the L5 dorsal rami) was locally anesthetized with a 1 ml volume of 1% Lidocaine  without Epinephrine .  The spinal needle was inserted and advanced in a trajectory view down to the target.   After contact with periosteum and negative aspirate for blood and CSF, correct placement without intravascular or epidural spread was confirmed by injecting 0.5 ml. of Isovue-250.  A spot radiograph was obtained of this image.    Next, a 0.5 ml. volume of the injectate described above was injected. The needle was then redirected to the other facet joint nerves mentioned  above if needed.  Prior to the procedure, the patient was given a Pain Diary which was completed for baseline measurements.  After the procedure, the patient rated their pain every 30 minutes and will continue rating at this frequency for a total of 5 hours.  The patient has been asked to complete the Diary and return to us  by mail, fax or hand delivered as soon as possible.   Additional Comments:  The  patient tolerated the procedure well Dressing: 2 x 2 sterile gauze and Band-Aid    Post-procedure details: Patient was observed during the procedure. Post-procedure instructions were reviewed.  Patient left the clinic in stable condition.   Clinical History: MRI LUMBAR SPINE WITHOUT CONTRAST, 01/29/2024 10:39 AM  INDICATION: Low back pain, symptoms persist with > 6 wks treatment, Low back pain, unspecified \ M54.50 Low back pain, unspecified \ G89.29 Other chronic pain \ M43.16 Spondylolisthesis, lumbar region  COMPARISON: 01/12/2024 x-ray.  TECHNIQUE: Multiplanar, multisequence surface-coil magnetic resonance imaging of the lumbar spine was performed without contrast.    LEVELS IMAGED: Lower thoracic region to upper sacrum.   FINDINGS:    Alignment: Mild anterolisthesis of L4 on L5.  Vertebrae: Mild edema like signal at left L4-L5 pedicle, likely degenerative. Vertebral body heights are maintained. No marrow signal abnormalities to suggest neoplasm.  Conus medullaris: In normal position. Normal signal and contour.  Degenerative changes: Multilevel disc desiccation.  T12-L1: No substantial canal or foraminal stenosis. L1-L2: No substantial canal or foraminal stenosis. L2-L3: Mild diffuse disc bulge. No substantial canal or foraminal stenosis. L3-L4: Diffuse disc bulge, facet arthropathy and ligamentum flavum hypertrophy contribute to mild canal and moderate bilateral foraminal stenosis. L4-L5: Diffuse disc bulge, facet arthropathy and ligamentum flavum hypertrophy contribute to mild canal, moderate right and mild left foraminal L5-S1: Facet arthropathy and ligamentum flavum hypertrophy. No substantial canal or foraminal stenosis.  Upper sacrum: No focal lesion identified.  IMPRESSION: Multilevel spondylosis as described above, resulting in mild central canal stenosis at L3-L4 and L4-L5 and moderate bilateral foraminal narrowing at L3-L4. Additional moderate right and mild left  foraminal stenosis at L4-L5. Exam End: 01/29/24 10:39  Specimen Collected: 01/30/24 12:43 Last Resulted: 01/30/24 15:13 Received From: [facility]     Objective:  VS:  HT:    WT:   BMI:     BP:131/88  HR:(!) 130bpm  TEMP: ( )  RESP:  Physical Exam Vitals and nursing note reviewed.  Constitutional:      General: She is not in acute distress.    Appearance: Normal appearance. She is obese. She is not ill-appearing.  HENT:     Head: Normocephalic and atraumatic.     Right Ear: External ear normal.     Left Ear: External ear normal.  Eyes:     Extraocular Movements: Extraocular movements intact.  Cardiovascular:     Rate and Rhythm: Normal rate.     Pulses: Normal pulses.  Pulmonary:     Effort: Pulmonary effort is normal. No respiratory distress.  Abdominal:     General: There is no distension.     Palpations: Abdomen is soft.  Musculoskeletal:        General: Tenderness present.     Cervical back: Neck supple.     Right lower leg: No edema.     Left lower leg: No edema.     Comments: Patient has good distal strength with no pain over the greater trochanters.  No clonus or focal weakness.  Skin:    Findings:  No erythema, lesion or rash.  Neurological:     General: No focal deficit present.     Mental Status: She is alert and oriented to person, place, and time.     Sensory: No sensory deficit.     Motor: No weakness or abnormal muscle tone.     Coordination: Coordination normal.  Psychiatric:        Mood and Affect: Mood normal.        Behavior: Behavior normal.      Imaging: No results found. "

## 2024-07-18 ENCOUNTER — Ambulatory Visit (HOSPITAL_COMMUNITY): Attending: Orthopedic Surgery

## 2024-07-18 ENCOUNTER — Encounter (HOSPITAL_COMMUNITY): Payer: Self-pay

## 2024-07-18 DIAGNOSIS — M545 Low back pain, unspecified: Secondary | ICD-10-CM | POA: Insufficient documentation

## 2024-07-18 DIAGNOSIS — R262 Difficulty in walking, not elsewhere classified: Secondary | ICD-10-CM | POA: Diagnosis present

## 2024-07-18 DIAGNOSIS — Z7409 Other reduced mobility: Secondary | ICD-10-CM | POA: Diagnosis present

## 2024-07-18 NOTE — Therapy (Signed)
 " OUTPATIENT PHYSICAL THERAPY THORACOLUMBAR TREATMENT   Patient Name: Erica [name] MRN: [id number] DOB:Feb 19, 1976, 49 y.o., female Today's Date: 07/18/2024  END OF SESSION:  PT End of Session - 07/18/24 0853     Visit Number 3    Number of Visits 10    Date for Recertification  08/03/24    Authorization Type [place] MEDICAID PREPAID HEALTH PLAN [place] MEDICAID [employer] [place]    Authorization Time Period 27 visits a year PT/OT and ST with no auth; needs auth after 27    Authorization - Visit Number 3    Authorization - Number of Visits 27    Progress Note Due on Visit 27    PT Start Time 0826   Late arrival   PT Stop Time 0859    PT Time Calculation (min) 33 min    Activity Tolerance Patient tolerated treatment well    Behavior During Therapy WFL for tasks assessed/performed           Past Medical History:  Diagnosis Date   Acid reflux    Asthma    Back pain    Eczema    Heart burn    High blood pressure    Osteoarthritis    stage 4 right hip   Pelvic inflammatory disease    SOBOE (shortness of breath on exertion)    Tubo-ovarian abscess    Past Surgical History:  Procedure Laterality Date   ABDOMINAL HYSTERECTOMY N/A 07/07/2013   Procedure: HYSTERECTOMY ABDOMINAL with bilateral salpingo-oophorectomy;  Surgeon: Agent KANDICE Solomons, MD;  Location: [facility];  Service: Gynecology;  Laterality: N/A;   CESAREAN SECTION     DILATION AND EVACUATION N/A 02/03/2013   Procedure: DILATATION AND EVACUATION;  Surgeon: Agent KANDICE Solomons, MD;  Location: [facility];  Service: Gynecology;  Laterality: N/A;   TOTAL HIP ARTHROPLASTY     Patient Active Problem List   Diagnosis Date Noted   Prediabetes 09/14/2023   Vitamin D  deficiency 09/14/2023   Elevated cholesterol 09/14/2023   Mild intermittent allergic asthma without complication 05/04/2023   GERD (gastroesophageal reflux disease) 05/04/2023   Preoperative respiratory examination 05/04/2023   Atypical asthma ? VCD 08/19/2021    Cigarette smoker 08/19/2021   Tobacco use disorder 01/23/2021   Chronic PID (chronic pelvic inflammatory disease) 07/07/2013   Right tubo-ovarian abscess 04/20/2013   Pelvic abscess in female 02/08/2013    PCP: Catharine Dunning D., PA-C   REFERRING PROVIDER: Georgina Ozell LABOR, MD  REFERRING DIAG: [phone] (ICD-10-CM) - Facet arthritis of lumbar region; M25.552 (ICD-10-CM) - Pain in left hip  Rationale for Evaluation and Treatment: Rehabilitation  THERAPY DIAG:  Low back pain, unspecified back pain laterality, unspecified chronicity, unspecified whether sciatica present  Impaired functional mobility, balance, gait, and endurance  Difficulty in walking, not elsewhere classified  ONSET DATE: few months  SUBJECTIVE:  SUBJECTIVE STATEMENT: 07/18/24:  Feeling good today, no reports of pain.  Reports she received injections in back yesterday.  Did not wear the shoes to address her leg length discrepancy today.    Eval:  Pt states she was coming for the right hip following the hip replacement and has shoe lift for LLE to compensate for increased length of RLE. Pt states she went to a different orthopaedic for left hip, that is now bothering her. Pt states she is also having back pain and had injections, which helped about 5 weeks ago. Doctor now talking about ablation. Pt states she is having pain with moving left hip. Pt states left hip is her primary concern. Pt is frustrated with healthcare system and lack of communication.  PERTINENT HISTORY:  Hx of R hip replacement  Lumbar injections 5 weeks ago  PAIN:  Are you having pain? Yes: NPRS scale: 6/10 when moving Pain location: deep inside hip Pain description: sharp Aggravating factors: lifting leg, walking  Relieving factors: sit down, muscle  relaxer  PRECAUTIONS: None  RED FLAGS: None   WEIGHT BEARING RESTRICTIONS: No  FALLS:  Has patient fallen in last 6 months? No  OCCUPATION: Nursing assistant at [employer]  PLOF: Independent and Independent with basic ADLs  PATIENT GOALS: decreased left hip and back pain, get better at squatting  NEXT MD VISIT: after therapy  OBJECTIVE:  Note: Objective measures were completed at Evaluation unless otherwise noted.  DIAGNOSTIC FINDINGS:  XRs of the lumbar spine from 04/20/2024 were independently reviewed and  interpreted, showing disc height loss at L4/5.  No other significant  degenerative changes seen.  No evidence of instability on  flexion/extension views.  No fracture or dislocation seen.  PATIENT SURVEYS:  LEFS Lower Extremity Functional Score: 45 / 80 = 56.3 %   SENSATION: Light touch: Impaired , LLE tingling when back flares up   POSTURE: flexed trunk   PALPATION: Pt demonstrates increased tenderness to palpation of bilateral lumbar paraspinals and bilateral sciatic regions, worse on LLE. Pt also demonstrates increased tenderness to anterior and lateral left hip.  LUMBAR ROM:   AROM eval  Flexion 70, pain  Extension 10, worse pain  Right lateral flexion 10  Left lateral flexion 10, slight pain  Right rotation 50% limited, back pain  Left rotation 50% limited, back pain   (Blank rows = not tested)  LOWER EXTREMITY ROM:     Active  Right eval Left eval  Hip flexion 90 88 degrees, pain  Hip extension    Hip abduction    Hip adduction    Hip internal rotation 30 30, increased pain  Hip external rotation 25, slight pain 30  Knee flexion    Knee extension    Ankle dorsiflexion    Ankle plantarflexion    Ankle inversion    Ankle eversion     (Blank rows = not tested)  LOWER EXTREMITY MMT:    MMT Right eval Left eval  Hip flexion 3+ 3  Hip extension 3+ 3+  Hip abduction 4- 3  Hip adduction    Hip internal rotation    Hip external  rotation    Knee flexion    Knee extension    Ankle dorsiflexion    Ankle plantarflexion    Ankle inversion    Ankle eversion     (Blank rows = not tested)  LUMBAR SPECIAL TESTS:  Straight leg raise test: Positive, left (back pain) 07/05/24:  Debby test: Lt LE positive  FUNCTIONAL TESTS:  5 times sit to stand: 07/05/24: 12.85 increased back pain 2 minute walk test: 07/05/24: 351ft no AD, no increased pain 07/05/24:  SLS Lt 30 reports of burning Rt 22  GAIT: Distance walked: 85 feet to and from treatment area Assistive device utilized: None Level of assistance: Modified independence Comments: pt demonstrates antalgic gait pattern due to pain in left hip and decreased gait speed.  TREATMENT DATE:  07/18/24: Sit to stand eccentric control with no HHA10x Heel raises incline slope 10x 3 Toe raises decline slope 10x 3 Abduction 10x Sidestep RTB around thigh 2RT Tandem stance 1x 30 2x 30 on foam Marching on foam 10x 5 SLS Lt 28, Rt 14 Squat with cueing for mechanics 10x   07/05/24: Reviewed goals Educated importance of HEP complaince for maximal benefits  5 times sit to stand: 12.85 increased back pain 2 minute walk test: 356ft no AD, no increased pain SLS Lt 30 reports of burning Rt 22  Supine: - Bridge 15x - LTR 5x 10 - Posterior pelvic tilt with verbal and tactile cueing 10x 5 Sidelying: - Abduction Standing: - Marching 10x  06/22/2024   Evaluation: -ROM measured, Strength assessed, HEP prescribed, pt educated on prognosis, findings, and importance of HEP compliance if given.                                                                                                                                      PATIENT EDUCATION:  Education details: Pt was educated on findings of PT evaluation, prognosis, frequency of therapy visits and rationale, attendance policy, and HEP if given.   Person educated: Patient Education method: Explanation,  Verbal cues, and Handouts Education comprehension: verbalized understanding, verbal cues required, and needs further education  HOME EXERCISE PROGRAM: Access Code: [email or web address] URL: [email or web address] Date: 06/22/2024 Prepared by: Lang Ada  Exercises - Supine Bridge  - 1 x daily - 7 x weekly - 3 sets - 10 reps - Supine Lower Trunk Rotation  - 1 x daily - 7 x weekly - 3 sets - 10 reps - Sidelying Hip Abduction  - 1 x daily - 7 x weekly - 3 sets - 10 reps  ASSESSMENT:  CLINICAL IMPRESSION: Session focus with general hip strengthening and balance training.  Added dynamic surface with intermittent HHA required to assist with balancee.  Theraband resistance added during balance/gait activities.  Pt tolerated well to session with no reports of increased pain through session.  Did require some cueing for mechanics and equal weight bearing during squat, tendency to bend at back vs LE.  Will continue to benefit from additional training on functional strengthening activities.    Patient is a 49 y.o. female who was seen today for physical therapy evaluation and treatment for M25.552 (ICD-10-CM) - Pain in left hip.   Patient demonstrates increased low back and left hip pain, decreased LE strength bilaterally, abnormal pain with mobility of  left hip, and impaired balance. Patient also demonstrates difficulty with ambulation during today's session with antalgic pattern noted, decreased stride length and velocity noted. Patient also demonstrates decreased increased time required with functional bed mobility and transfers. Patient requires education on role of PT, prognosis, importance of HEP compliance and overall POC. Patient would benefit from skilled physical therapy for decreased low back and hip pain, increased endurance with ambulation, increased LE strength/ROM, and improved balance for improved gait quality, return to higher level of function with ADLs, and progress towards therapy  goals.   OBJECTIVE IMPAIRMENTS: Abnormal gait, decreased activity tolerance, decreased balance, decreased endurance, decreased knowledge of condition, decreased knowledge of use of DME, decreased mobility, difficulty walking, decreased ROM, decreased strength, hypomobility, and pain.   ACTIVITY LIMITATIONS: carrying, lifting, bending, standing, squatting, sleeping, stairs, transfers, and bed mobility  PARTICIPATION LIMITATIONS: meal prep, cleaning, laundry, driving, shopping, community activity, occupation, and yard work  PERSONAL FACTORS: Age, Past/current experiences, Time since onset of injury/illness/exacerbation, and 1 comorbidity: hx of back pain and R THA are also affecting patient's functional outcome.   REHAB POTENTIAL: Fair multiple procedures completed RTHA, injections and ablation coming up.  CLINICAL DECISION MAKING: Evolving/moderate complexity  EVALUATION COMPLEXITY: Low   GOALS: Goals reviewed with patient? No  SHORT TERM GOALS: Target date: 07/13/24  Pt will be independent with HEP in order to demonstrate participation in Physical Therapy POC.  Baseline: Goal status: INITIAL  2.  Pt will report 4/10 pain with mobility in order to demonstrate improved pain with ADLs.  Baseline: 6/10  Goal status: INITIAL  LONG TERM GOALS: Target date: 08/10/24  Pt will improve 5TSTS by 5 seconds in order to demonstrate improved functional strength to return to desired activities.  Baseline: see objective.  Goal status: INITIAL  2.  Pt will improve 2 MWT by 40 feet in order to demonstrate improved functional ambulatory capacity in community setting.  Baseline: see objective.  Goal status: INITIAL  3.  Pt will improve LEFS score by at least 9 points in order to demonstrate improved pain with functional goals and outcomes. Baseline: see objective.  Goal status: INITIAL  4.  Pt will report 3/10 pain with mobility in order to demonstrate reduced pain with ADLs lasting greater  than 30 minutes.  Baseline: see objective.  Goal status: INITIAL   PLAN:  PT FREQUENCY: 1-2x/week  PT DURATION: 6 weeks  PLANNED INTERVENTIONS: 97110-Therapeutic exercises, 97530- Therapeutic activity, 97112- Neuromuscular re-education, 97535- Self Care, 02859- Manual therapy, [phone]- Gait training, Patient/Family education, Balance training, Stair training, Joint mobilization, Joint manipulation, Spinal manipulation, Spinal mobilization, DME instructions, Cryotherapy, and Moist heat.  PLAN FOR NEXT SESSION: progress pain management of low back and left hip, progress left hip and low back strengthening and mobility.  Continue squats, add vector stance and lunges.   Augustin Mclean, LPTA/CLT; CBIS [phone]   1:47 PM, 07/18/2024   "

## 2024-07-19 ENCOUNTER — Encounter: Payer: Self-pay | Admitting: Physical Medicine and Rehabilitation

## 2024-07-21 ENCOUNTER — Telehealth (HOSPITAL_COMMUNITY): Payer: Self-pay

## 2024-07-21 ENCOUNTER — Ambulatory Visit (HOSPITAL_COMMUNITY)

## 2024-07-21 ENCOUNTER — Other Ambulatory Visit: Payer: Self-pay | Admitting: Physical Medicine and Rehabilitation

## 2024-07-21 DIAGNOSIS — G8929 Other chronic pain: Secondary | ICD-10-CM

## 2024-07-21 DIAGNOSIS — M47816 Spondylosis without myelopathy or radiculopathy, lumbar region: Secondary | ICD-10-CM

## 2024-07-21 MED ORDER — DIAZEPAM 5 MG PO TABS: ORAL_TABLET | ORAL | 0 refills | Status: AC

## 2024-07-21 NOTE — Telephone Encounter (Signed)
 No show, called and left message concerning missed apt today. Reminded next apt date and time with contact number included if needs to cancel or reschedule in the future.   Augustin Mclean, LPTA/CLT; WILLAIM [phone]

## 2024-07-21 NOTE — Progress Notes (Signed)
 Spoke with patient, she felt lumbar facet/medial branch blocks were significantly beneficial in alleviating her pain. Feels these injections would be even more beneficial if pain relief would sustain for longer duration. I did go ahead and place order for RFA procedure. I also called in pre-procedure Valium  for her.

## 2024-07-25 ENCOUNTER — Ambulatory Visit (HOSPITAL_COMMUNITY)

## 2024-07-25 ENCOUNTER — Telehealth (HOSPITAL_COMMUNITY): Payer: Self-pay

## 2024-07-25 NOTE — Telephone Encounter (Signed)
 2nd no show, called and spoke to pt who was asleep and had forgotten about apt. Reminded next apt date and time with contact number included. Educated on no show policy and required to schedule apts one at a time.   Augustin Mclean, LPTA/CLT; WILLAIM [phone]

## 2024-08-01 ENCOUNTER — Ambulatory Visit (HOSPITAL_COMMUNITY)

## 2024-08-01 ENCOUNTER — Encounter (HOSPITAL_COMMUNITY): Payer: Self-pay

## 2024-08-01 DIAGNOSIS — R262 Difficulty in walking, not elsewhere classified: Secondary | ICD-10-CM

## 2024-08-01 DIAGNOSIS — Z7409 Other reduced mobility: Secondary | ICD-10-CM

## 2024-08-01 DIAGNOSIS — M545 Low back pain, unspecified: Secondary | ICD-10-CM

## 2024-08-01 NOTE — Therapy (Signed)
 [facility] Rehabilitation at [facility] [address] [place], [place], [address] Phone: [phone]   Fax:  [phone]  Patient Details  Name: Erica [name] MRN: [id number] Date of Birth: 05-Jun-1976 Referring Provider:  No ref. provider found  Encounter Date: 08/01/2024  PHYSICAL THERAPY DISCHARGE SUMMARY  Visits from Start of Care: 2  Current functional level related to goals / functional outcomes: Please see last treatment note, pt has not returned.   Remaining deficits: Please see last treatment note, pt has not returned.   Education / Equipment: Please see last treatment note, pt has not returned.   Patient agrees to discharge. Patient goals were Please see last treatment note, pt has not returned.. Patient is being discharged due to the patient's request.  Pt called to cancel her appointment due to weather and will not be coming back to therapy. Will discharge her from this episode of care.  Lang Ada, PT, DPT [facility] Office: [phone] 11:11 AM, 08/01/24   [facility] at [facility] [address] [place], [place], [address] Phone: [phone]   Fax:  [phone]

## 2024-08-08 ENCOUNTER — Ambulatory Visit (HOSPITAL_COMMUNITY)

## 2024-08-09 ENCOUNTER — Other Ambulatory Visit: Payer: Self-pay

## 2024-08-09 ENCOUNTER — Ambulatory Visit: Admitting: Physical Medicine and Rehabilitation

## 2024-08-09 VITALS — BP 130/94 | HR 72

## 2024-08-09 DIAGNOSIS — M47816 Spondylosis without myelopathy or radiculopathy, lumbar region: Secondary | ICD-10-CM

## 2024-08-09 MED ORDER — METHYLPREDNISOLONE ACETATE 40 MG/ML IJ SUSP: 40.0000 mg | Freq: Once | INTRAMUSCULAR | Status: AC

## 2024-08-09 NOTE — Progress Notes (Unsigned)
 Pain Scale   Average Pain 7 Patient advising she has lower back pain that is constant. Without relief        +Driver, -BT, -Dye Allergies.

## 2024-08-15 ENCOUNTER — Ambulatory Visit (HOSPITAL_COMMUNITY)
